# Patient Record
Sex: Female | Born: 1965
Health system: Southern US, Community
[De-identification: ages and names within clinical notes are randomized; demographics above are authoritative.]

## PROBLEM LIST (undated history)

## (undated) DIAGNOSIS — J45909 Unspecified asthma, uncomplicated: Secondary | ICD-10-CM

## (undated) DIAGNOSIS — I10 Essential (primary) hypertension: Secondary | ICD-10-CM

## (undated) DIAGNOSIS — Z5189 Encounter for other specified aftercare: Secondary | ICD-10-CM

## (undated) DIAGNOSIS — M359 Systemic involvement of connective tissue, unspecified: Secondary | ICD-10-CM

## (undated) DIAGNOSIS — E039 Hypothyroidism, unspecified: Secondary | ICD-10-CM

## (undated) DIAGNOSIS — D560 Alpha thalassemia: Secondary | ICD-10-CM

## (undated) DIAGNOSIS — T8859XA Other complications of anesthesia, initial encounter: Secondary | ICD-10-CM

## (undated) DIAGNOSIS — M329 Systemic lupus erythematosus, unspecified: Secondary | ICD-10-CM

## (undated) DIAGNOSIS — D649 Anemia, unspecified: Secondary | ICD-10-CM

## (undated) DIAGNOSIS — M1990 Unspecified osteoarthritis, unspecified site: Secondary | ICD-10-CM

## (undated) DIAGNOSIS — IMO0002 Reserved for concepts with insufficient information to code with codable children: Secondary | ICD-10-CM

## (undated) DIAGNOSIS — T4145XA Adverse effect of unspecified anesthetic, initial encounter: Secondary | ICD-10-CM

## (undated) DIAGNOSIS — R112 Nausea with vomiting, unspecified: Secondary | ICD-10-CM

## (undated) DIAGNOSIS — Z9889 Other specified postprocedural states: Secondary | ICD-10-CM

## (undated) DIAGNOSIS — IMO0001 Reserved for inherently not codable concepts without codable children: Secondary | ICD-10-CM

## (undated) HISTORY — DX: Unspecified osteoarthritis, unspecified site: M19.90

## (undated) HISTORY — DX: Encounter for other specified aftercare: Z51.89

## (undated) HISTORY — DX: Unspecified asthma, uncomplicated: J45.909

## (undated) HISTORY — DX: Alpha thalassemia: D56.0

## (undated) HISTORY — DX: Anemia, unspecified: D64.9

## (undated) HISTORY — DX: Reserved for inherently not codable concepts without codable children: IMO0001

## (undated) HISTORY — DX: Reserved for concepts with insufficient information to code with codable children: IMO0002

## (undated) HISTORY — DX: Systemic lupus erythematosus, unspecified: M32.9

---

## 2002-02-08 ENCOUNTER — Other Ambulatory Visit: Admission: RE | Admit: 2002-02-08 | Discharge: 2002-02-08 | Payer: Self-pay | Admitting: Obstetrics and Gynecology

## 2003-10-15 ENCOUNTER — Other Ambulatory Visit: Admission: RE | Admit: 2003-10-15 | Discharge: 2003-10-15 | Payer: Self-pay | Admitting: Obstetrics and Gynecology

## 2003-11-07 ENCOUNTER — Emergency Department (HOSPITAL_COMMUNITY): Admission: EM | Admit: 2003-11-07 | Discharge: 2003-11-08 | Payer: Self-pay | Admitting: Emergency Medicine

## 2004-11-03 ENCOUNTER — Other Ambulatory Visit: Admission: RE | Admit: 2004-11-03 | Discharge: 2004-11-03 | Payer: Self-pay | Admitting: Obstetrics and Gynecology

## 2005-10-06 ENCOUNTER — Emergency Department (HOSPITAL_COMMUNITY): Admission: EM | Admit: 2005-10-06 | Discharge: 2005-10-06 | Payer: Self-pay | Admitting: Emergency Medicine

## 2006-02-16 ENCOUNTER — Emergency Department (HOSPITAL_COMMUNITY): Admission: EM | Admit: 2006-02-16 | Discharge: 2006-02-16 | Payer: Self-pay | Admitting: Emergency Medicine

## 2006-08-31 ENCOUNTER — Emergency Department (HOSPITAL_COMMUNITY): Admission: EM | Admit: 2006-08-31 | Discharge: 2006-08-31 | Payer: Self-pay | Admitting: Emergency Medicine

## 2006-11-03 ENCOUNTER — Ambulatory Visit: Payer: Self-pay | Admitting: Endocrinology

## 2006-11-03 LAB — CONVERTED CEMR LAB
ALT: 34 units/L (ref 0–40)
AST: 37 units/L (ref 0–37)
Albumin: 3.5 g/dL (ref 3.5–5.2)
Alkaline Phosphatase: 72 units/L (ref 39–117)
BUN: 10 mg/dL (ref 6–23)
Basophils Absolute: 0 10*3/uL (ref 0.0–0.1)
Basophils Relative: 0.2 % (ref 0.0–1.0)
Bilirubin Urine: NEGATIVE
Bilirubin, Direct: 0.1 mg/dL (ref 0.0–0.3)
CO2: 22 meq/L (ref 19–32)
Calcium: 9 mg/dL (ref 8.4–10.5)
Chloride: 112 meq/L (ref 96–112)
Cholesterol: 187 mg/dL (ref 0–200)
Creatinine, Ser: 0.6 mg/dL (ref 0.4–1.2)
Eosinophils Absolute: 0 10*3/uL (ref 0.0–0.6)
Eosinophils Relative: 0.9 % (ref 0.0–5.0)
GFR calc Af Amer: 143 mL/min
GFR calc non Af Amer: 118 mL/min
Glucose, Bld: 92 mg/dL (ref 70–99)
HCT: 28 % — ABNORMAL LOW (ref 36.0–46.0)
HDL: 31.5 mg/dL — ABNORMAL LOW (ref >39.0)
Hemoglobin, Urine: NEGATIVE
Hemoglobin: 9 g/dL — ABNORMAL LOW (ref 12.0–15.0)
Ketones, ur: NEGATIVE mg/dL
LDL Cholesterol: 126 mg/dL — ABNORMAL HIGH (ref 0–99)
Leukocytes, UA: NEGATIVE
Lymphocytes Relative: 26 % (ref 12.0–46.0)
MCHC: 32.2 g/dL (ref 30.0–36.0)
MCV: 64.4 fL — ABNORMAL LOW (ref 78.0–100.0)
Monocytes Absolute: 0.5 10*3/uL (ref 0.2–0.7)
Monocytes Relative: 11.1 % — ABNORMAL HIGH (ref 3.0–11.0)
Neutro Abs: 3.1 10*3/uL (ref 1.4–7.7)
Neutrophils Relative %: 61.8 % (ref 43.0–77.0)
Nitrite: NEGATIVE
Platelets: 281 10*3/uL (ref 150–400)
Potassium: 3.7 meq/L (ref 3.5–5.1)
RBC: 4.34 M/uL (ref 3.87–5.11)
RDW: 18.7 % — ABNORMAL HIGH (ref 11.5–14.6)
Sodium: 142 meq/L (ref 135–145)
Specific Gravity, Urine: 1.025 (ref 1.000–1.03)
TSH: 3.02 microintl units/mL (ref 0.35–5.50)
Total Bilirubin: 0.4 mg/dL (ref 0.3–1.2)
Total CHOL/HDL Ratio: 5.9
Total Protein: 8 g/dL (ref 6.0–8.3)
Triglycerides: 149 mg/dL (ref 0–149)
Urine Glucose: NEGATIVE mg/dL
Urobilinogen, UA: 0.2 (ref 0.0–1.0)
VLDL: 30 mg/dL (ref 0–40)
WBC: 4.8 10*3/uL (ref 4.5–10.5)
pH: 6.5 (ref 5.0–8.0)

## 2006-11-07 ENCOUNTER — Ambulatory Visit: Payer: Self-pay | Admitting: Endocrinology

## 2006-11-10 ENCOUNTER — Ambulatory Visit: Payer: Self-pay | Admitting: Endocrinology

## 2006-11-10 LAB — CONVERTED CEMR LAB
Basophils Relative: 0.4 % (ref 0.0–1.0)
Eosinophils Relative: 1.5 % (ref 0.0–5.0)
Lymphocytes Relative: 33.4 % (ref 12.0–46.0)
Monocytes Absolute: 0.6 10*3/uL (ref 0.2–0.7)
Monocytes Relative: 14.2 % — ABNORMAL HIGH (ref 3.0–11.0)
Neutro Abs: 1.9 10*3/uL (ref 1.4–7.7)
Neutrophils Relative %: 50.5 % (ref 43.0–77.0)
RDW: 18.5 % — ABNORMAL HIGH (ref 11.5–14.6)
WBC: 3.9 10*3/uL — ABNORMAL LOW (ref 4.5–10.5)

## 2006-11-27 ENCOUNTER — Ambulatory Visit: Payer: Self-pay | Admitting: Endocrinology

## 2006-11-27 LAB — CONVERTED CEMR LAB
Hemoglobin: 6.6 g/dL — CL (ref 12.0–15.0)
Iron: 43 ug/dL (ref 42–145)
Lymphocytes Relative: 28.3 % (ref 12.0–46.0)
MCHC: 32 g/dL (ref 30.0–36.0)
MCV: 64.1 fL — ABNORMAL LOW (ref 78.0–100.0)
Monocytes Absolute: 0.5 10*3/uL (ref 0.2–0.7)
Monocytes Relative: 13.2 % — ABNORMAL HIGH (ref 3.0–11.0)
Neutro Abs: 2 10*3/uL (ref 1.4–7.7)
Platelets: 199 10*3/uL (ref 150–400)
Prolactin: 72.3 ng/mL
RBC: 3.21 M/uL — ABNORMAL LOW (ref 3.87–5.11)
Transferrin: 234.1 mg/dL (ref >=212.0)
WBC: 3.5 10*3/uL — ABNORMAL LOW (ref 4.5–10.5)

## 2007-01-17 ENCOUNTER — Emergency Department (HOSPITAL_COMMUNITY): Admission: EM | Admit: 2007-01-17 | Discharge: 2007-01-17 | Payer: Self-pay | Admitting: Emergency Medicine

## 2007-01-21 ENCOUNTER — Emergency Department (HOSPITAL_COMMUNITY): Admission: EM | Admit: 2007-01-21 | Discharge: 2007-01-21 | Payer: Self-pay | Admitting: *Deleted

## 2007-01-23 ENCOUNTER — Ambulatory Visit: Payer: Self-pay | Admitting: Endocrinology

## 2007-01-31 ENCOUNTER — Ambulatory Visit: Payer: Self-pay | Admitting: Internal Medicine

## 2007-03-22 ENCOUNTER — Ambulatory Visit: Payer: Self-pay | Admitting: Internal Medicine

## 2007-05-03 ENCOUNTER — Encounter: Payer: Self-pay | Admitting: Endocrinology

## 2007-05-03 DIAGNOSIS — E039 Hypothyroidism, unspecified: Secondary | ICD-10-CM | POA: Insufficient documentation

## 2007-05-03 DIAGNOSIS — D509 Iron deficiency anemia, unspecified: Secondary | ICD-10-CM | POA: Insufficient documentation

## 2007-05-07 ENCOUNTER — Ambulatory Visit: Payer: Self-pay | Admitting: Internal Medicine

## 2007-05-07 ENCOUNTER — Ambulatory Visit: Payer: Self-pay | Admitting: Oncology

## 2007-05-07 ENCOUNTER — Inpatient Hospital Stay (HOSPITAL_COMMUNITY): Admission: EM | Admit: 2007-05-07 | Discharge: 2007-05-12 | Payer: Self-pay | Admitting: *Deleted

## 2007-05-07 ENCOUNTER — Encounter (INDEPENDENT_AMBULATORY_CARE_PROVIDER_SITE_OTHER): Payer: Self-pay | Admitting: *Deleted

## 2007-05-07 ENCOUNTER — Ambulatory Visit: Payer: Self-pay | Admitting: Cardiology

## 2007-05-08 ENCOUNTER — Ambulatory Visit: Payer: Self-pay | Admitting: Internal Medicine

## 2007-05-11 ENCOUNTER — Ambulatory Visit: Payer: Self-pay | Admitting: Oncology

## 2007-05-21 ENCOUNTER — Ambulatory Visit: Payer: Self-pay | Admitting: Endocrinology

## 2007-05-22 LAB — CBC WITH DIFFERENTIAL/PLATELET
BASO%: 0.1 % (ref 0.0–2.0)
EOS%: 0.3 % (ref 0.0–7.0)
HCT: 27.4 % — ABNORMAL LOW (ref 34.8–46.6)
MCH: 21.5 pg — ABNORMAL LOW (ref 26.0–34.0)
MCHC: 32.4 g/dL (ref 32.0–36.0)
MCV: 66.3 fL — ABNORMAL LOW (ref 81.0–101.0)
NEUT#: 1.8 10*3/uL (ref 1.5–6.5)
Platelets: 264 10*3/uL (ref 145–400)
RBC: 4.14 10*6/uL (ref 3.70–5.32)
RDW: 22.5 % — ABNORMAL HIGH (ref 11.3–14.5)
lymph#: 1 10*3/uL (ref 0.9–3.3)

## 2007-05-22 LAB — MORPHOLOGY

## 2007-06-13 LAB — CBC WITH DIFFERENTIAL/PLATELET
BASO%: 0 % (ref 0.0–2.0)
EOS%: 3.3 % (ref 0.0–7.0)
LYMPH%: 21.8 % (ref 14.0–48.0)
MCH: 22.5 pg — ABNORMAL LOW (ref 26.0–34.0)
MCV: 67.1 fL — ABNORMAL LOW (ref 81.0–101.0)
Platelets: 209 10*3/uL (ref 145–400)
RDW: 22.7 % — ABNORMAL HIGH (ref 11.3–14.5)
lymph#: 0.8 10*3/uL — ABNORMAL LOW (ref 0.9–3.3)

## 2007-07-11 ENCOUNTER — Ambulatory Visit: Payer: Self-pay | Admitting: Oncology

## 2007-07-18 ENCOUNTER — Emergency Department (HOSPITAL_COMMUNITY): Admission: EM | Admit: 2007-07-18 | Discharge: 2007-07-18 | Payer: Self-pay | Admitting: Emergency Medicine

## 2007-07-25 ENCOUNTER — Encounter: Payer: Self-pay | Admitting: Endocrinology

## 2007-07-25 ENCOUNTER — Ambulatory Visit: Payer: Self-pay | Admitting: Endocrinology

## 2007-07-25 DIAGNOSIS — D353 Benign neoplasm of craniopharyngeal duct: Secondary | ICD-10-CM

## 2007-07-25 DIAGNOSIS — D352 Benign neoplasm of pituitary gland: Secondary | ICD-10-CM | POA: Insufficient documentation

## 2007-07-25 LAB — CBC WITH DIFFERENTIAL/PLATELET
Basophils Absolute: 0 10*3/uL (ref 0.0–0.1)
EOS%: 1.4 % (ref 0.0–7.0)
Eosinophils Absolute: 0.1 10*3/uL (ref 0.0–0.5)
HCT: 26.1 % — ABNORMAL LOW (ref 34.8–46.6)
HGB: 8.6 g/dL — ABNORMAL LOW (ref 11.6–15.9)
MCHC: 32.9 g/dL (ref 32.0–36.0)
MONO#: 0.5 10*3/uL (ref 0.1–0.9)
NEUT%: 66.3 % (ref 39.6–76.8)
Platelets: 230 10*3/uL (ref 145–400)
RBC: 3.82 10*6/uL (ref 3.70–5.32)
WBC: 4.8 10*3/uL (ref 3.9–10.0)
lymph#: 1 10*3/uL (ref 0.9–3.3)

## 2007-07-25 LAB — IRON AND TIBC
%SAT: 26 % (ref 20–55)
Iron: 54 ug/dL (ref 42–145)

## 2007-07-25 LAB — CONVERTED CEMR LAB: TSH: 1.69 microintl units/mL (ref 0.35–5.50)

## 2007-07-25 LAB — FERRITIN: Ferritin: 1107 ng/mL — ABNORMAL HIGH (ref 10–291)

## 2007-07-30 ENCOUNTER — Encounter: Admission: RE | Admit: 2007-07-30 | Discharge: 2007-07-30 | Payer: Self-pay | Admitting: Endocrinology

## 2007-08-26 ENCOUNTER — Emergency Department (HOSPITAL_COMMUNITY): Admission: EM | Admit: 2007-08-26 | Discharge: 2007-08-26 | Payer: Self-pay | Admitting: Emergency Medicine

## 2007-09-02 ENCOUNTER — Emergency Department (HOSPITAL_COMMUNITY): Admission: EM | Admit: 2007-09-02 | Discharge: 2007-09-02 | Payer: Self-pay | Admitting: Emergency Medicine

## 2007-09-18 ENCOUNTER — Telehealth (INDEPENDENT_AMBULATORY_CARE_PROVIDER_SITE_OTHER): Payer: Self-pay | Admitting: *Deleted

## 2007-09-21 ENCOUNTER — Emergency Department (HOSPITAL_COMMUNITY): Admission: EM | Admit: 2007-09-21 | Discharge: 2007-09-21 | Payer: Self-pay | Admitting: Family Medicine

## 2007-10-04 HISTORY — PX: ABDOMINAL HYSTERECTOMY: SHX81

## 2007-10-09 ENCOUNTER — Telehealth (INDEPENDENT_AMBULATORY_CARE_PROVIDER_SITE_OTHER): Payer: Self-pay | Admitting: *Deleted

## 2007-10-11 ENCOUNTER — Ambulatory Visit: Payer: Self-pay | Admitting: Endocrinology

## 2007-10-11 DIAGNOSIS — K1120 Sialoadenitis, unspecified: Secondary | ICD-10-CM | POA: Insufficient documentation

## 2007-10-24 ENCOUNTER — Telehealth (INDEPENDENT_AMBULATORY_CARE_PROVIDER_SITE_OTHER): Payer: Self-pay | Admitting: *Deleted

## 2007-10-24 ENCOUNTER — Emergency Department (HOSPITAL_COMMUNITY): Admission: EM | Admit: 2007-10-24 | Discharge: 2007-10-24 | Payer: Self-pay | Admitting: Emergency Medicine

## 2007-11-06 ENCOUNTER — Ambulatory Visit: Payer: Self-pay | Admitting: Oncology

## 2007-11-08 ENCOUNTER — Encounter: Payer: Self-pay | Admitting: Endocrinology

## 2007-11-08 LAB — CBC WITH DIFFERENTIAL/PLATELET
Basophils Absolute: 0 10*3/uL (ref 0.0–0.1)
Eosinophils Absolute: 0.2 10*3/uL (ref 0.0–0.5)
HCT: 28.8 % — ABNORMAL LOW (ref 34.8–46.6)
HGB: 8.8 g/dL — ABNORMAL LOW (ref 11.6–15.9)
MCH: 20.7 pg — ABNORMAL LOW (ref 26.0–34.0)
MONO#: 0.4 10*3/uL (ref 0.1–0.9)
NEUT#: 2.9 10*3/uL (ref 1.5–6.5)
NEUT%: 64.2 % (ref 39.6–76.8)
WBC: 4.5 10*3/uL (ref 3.9–10.0)
lymph#: 1 10*3/uL (ref 0.9–3.3)

## 2008-02-05 ENCOUNTER — Ambulatory Visit: Payer: Self-pay | Admitting: Oncology

## 2008-03-01 ENCOUNTER — Emergency Department (HOSPITAL_COMMUNITY): Admission: EM | Admit: 2008-03-01 | Discharge: 2008-03-01 | Payer: Self-pay | Admitting: Emergency Medicine

## 2008-04-23 ENCOUNTER — Ambulatory Visit: Payer: Self-pay | Admitting: Obstetrics and Gynecology

## 2008-04-23 ENCOUNTER — Other Ambulatory Visit: Admission: RE | Admit: 2008-04-23 | Discharge: 2008-04-23 | Payer: Self-pay | Admitting: Obstetrics and Gynecology

## 2008-05-21 ENCOUNTER — Encounter: Payer: Self-pay | Admitting: Obstetrics and Gynecology

## 2008-05-21 ENCOUNTER — Ambulatory Visit: Payer: Self-pay | Admitting: Obstetrics & Gynecology

## 2008-06-03 ENCOUNTER — Inpatient Hospital Stay (HOSPITAL_COMMUNITY): Admission: AD | Admit: 2008-06-03 | Discharge: 2008-06-03 | Payer: Self-pay | Admitting: Obstetrics & Gynecology

## 2008-06-04 ENCOUNTER — Ambulatory Visit (HOSPITAL_COMMUNITY): Admission: RE | Admit: 2008-06-04 | Discharge: 2008-06-04 | Payer: Self-pay | Admitting: Obstetrics and Gynecology

## 2008-07-15 ENCOUNTER — Ambulatory Visit: Payer: Self-pay | Admitting: Obstetrics & Gynecology

## 2008-07-15 ENCOUNTER — Ambulatory Visit (HOSPITAL_COMMUNITY): Admission: RE | Admit: 2008-07-15 | Discharge: 2008-07-15 | Payer: Self-pay | Admitting: Obstetrics & Gynecology

## 2008-07-31 ENCOUNTER — Ambulatory Visit: Payer: Self-pay | Admitting: Obstetrics and Gynecology

## 2008-08-11 ENCOUNTER — Inpatient Hospital Stay (HOSPITAL_COMMUNITY): Admission: AD | Admit: 2008-08-11 | Discharge: 2008-08-11 | Payer: Self-pay | Admitting: Obstetrics & Gynecology

## 2008-08-13 ENCOUNTER — Ambulatory Visit: Payer: Self-pay | Admitting: Obstetrics & Gynecology

## 2008-09-02 ENCOUNTER — Encounter: Payer: Self-pay | Admitting: Obstetrics & Gynecology

## 2008-09-02 ENCOUNTER — Ambulatory Visit: Payer: Self-pay | Admitting: Obstetrics & Gynecology

## 2008-09-02 ENCOUNTER — Inpatient Hospital Stay (HOSPITAL_COMMUNITY): Admission: RE | Admit: 2008-09-02 | Discharge: 2008-09-04 | Payer: Self-pay | Admitting: Obstetrics & Gynecology

## 2008-09-02 HISTORY — PX: HYSTERECTOMY ABDOMINAL WITH SALPINGO-OOPHORECTOMY: SHX6792

## 2008-09-18 ENCOUNTER — Ambulatory Visit: Payer: Self-pay | Admitting: Obstetrics & Gynecology

## 2008-10-01 ENCOUNTER — Ambulatory Visit: Payer: Self-pay | Admitting: Obstetrics and Gynecology

## 2008-10-03 HISTORY — PX: KNEE SURGERY: SHX244

## 2008-10-15 ENCOUNTER — Ambulatory Visit: Payer: Self-pay | Admitting: Obstetrics & Gynecology

## 2008-10-29 ENCOUNTER — Ambulatory Visit: Payer: Self-pay | Admitting: Obstetrics & Gynecology

## 2009-12-02 ENCOUNTER — Telehealth (INDEPENDENT_AMBULATORY_CARE_PROVIDER_SITE_OTHER): Payer: Self-pay | Admitting: *Deleted

## 2010-10-24 ENCOUNTER — Encounter: Payer: Self-pay | Admitting: *Deleted

## 2010-11-02 NOTE — Progress Notes (Signed)
Summary: Records request from [facility]  Request for records received from [facility]. Request forwarded to [facility]. Dena Chavis  December 02, 2009 11:49 AM

## 2011-02-15 NOTE — Group Therapy Note (Signed)
NAMECLYTEE, HEINRICH NO.:  [id number]   MEDICAL RECORD NO.:  [id number]          PATIENT TYPE:  WOC   LOCATION:  [facility]                   FACILITY:  [facility]   PHYSICIAN:  Elsie Lincoln, MD      DATE OF BIRTH:  [birth year]   DATE OF SERVICE:  08/13/2008                                  CLINIC NOTE   The patient is a 45 year old female who had endometrial ablation a few  weeks ago.  It has failed.  She was seen in the MAU yesterday bleeding  heavily.  Hemoglobin is still 7.5 despite being on iron twice a day.  At  this point she needs a hysterectomy simply because the fibroid is so  large it did encompass her entire cavity.  She was consented for this  procedure.  Given that it is about 12-14 weeks' size on bimanual, she  was consented for a total abdominal hysterectomy.  She will retain her  ovaries.  The risks of this procedure are bleeding, infection, damage to  the intra-abdominal organs.  So we will try to get her in in the next  few weeks for the hysterectomy.  Patient is to continue the iron.           ______________________________  Elsie Lincoln, MD     KL/MEDQ  D:  08/13/2008  T:  08/14/2008  Job:  [id number]

## 2011-02-15 NOTE — Group Therapy Note (Signed)
Michelle Huffman, Michelle Huffman             ACCOUNT NO.:  [id number]   MEDICAL RECORD NO.:  [id number]          PATIENT TYPE:  WOC   LOCATION:  [facility]                   FACILITY:  [facility]   PHYSICIAN:  Allie Bossier, MD        DATE OF BIRTH:  [birth year]   DATE OF SERVICE:                                  CLINIC NOTE   HISTORY OF PRESENT ILLNESS:  Michelle Huffman is now 6 weeks postop status post  TAH done by Dr. Silas Flood.  Pathology has come back with fibroids.  Her right  ovary and tube were also removed and these were unremarkable.  There was  no evidence of endometriosis or malignancy.  She was seen here 2 weeks  ago by Dr. Okey Dupre, at that time for wound check and there was small area  (1 cm) in the middle that he described as healing.  Her complaint today  is especially bad back pain.  She says that she is supposed to go back  to work tomorrow but does not feel that her pain is controlled enough  for her to be able to go to work.  She says the Percocet don't work  and she says she needs to get a refill on the ibuprofen.  On her  abdominal exam her incision is completely healed and looks beautiful.  There is no specific point tenderness.  However, she walks as if she is  in pain.  I have written her out of work for another 2 weeks and  recommended that she make another follow-up visit here in 1 to 2 weeks.  I have also recommended ibuprofen 800 p.o. q.8 h with food.      Allie Bossier, MD     MCD/MEDQ  D:  10/15/2008  T:  10/16/2008  Job:  [id number]

## 2011-02-15 NOTE — Op Note (Signed)
Michelle Huffman, Michelle Huffman             ACCOUNT NO.:  [id number]   MEDICAL RECORD NO.:  [id number]          PATIENT TYPE:  INP   LOCATION:  9316                          [facility]   PHYSICIAN:  Norton Blizzard, MD    DATE OF BIRTH:  1966-08-08   DATE OF PROCEDURE:  09/02/2008  DATE OF DISCHARGE:                               OPERATIVE REPORT   PREOPERATIVE DIAGNOSES:  Menorrhagia, uterine fibroids, failed  endometrial ablation.   POSTOPERATIVE DIAGNOSES:  Menorrhagia, uterine fibroids, failed  endometrial ablation.   PROCEDURE:  Total abdominal hysterectomy, right salpingo-oophorectomy.   SURGEON:  Norton Blizzard, MD   ASSISTANT:  Lesly Dukes, MD   ANESTHESIA:  General.   IV FLUIDS:  1700 mL of lactated Ringer's.   URINE OUTPUT:  300 mL.   ESTIMATED BLOOD LOSS:  250 mL.   INDICATIONS:  The patient is a 45 year old gravida 3, para 1-0-2-1 with  a long history of menorrhagia and uterine fibroids.  The patient  underwent an endometrial ablation in October using hydrothermal  ablation, but continued to have heavy bleeding and bled down to a  hemoglobin of 7.5.  The patient was counseled regarding the need for a  total abdominal hysterectomy.  Prior to surgery, the risks of surgery  including bleeding, infection, injury to surrounding organs including  bowel, bladder, ureters, and need for additional procedures were  explained to the patient, and written informed consent was obtained.   FINDINGS:  10 weeks' size fibroid uterus, adherent right adnexae, normal  left fallopian tube and ovary.   SPECIMENS:  Uterus plus cervix plus right fallopian tube and ovary.   DISPOSITION:  Specimens to Pathology.   COMPLICATIONS:  None.   PROCEDURE DETAILS:  The patient received IV preoperative antibiotics  prior to entry into the operating room.  Compression boots were also  applied to the lower extremities in the preoperative area.  The patient  was then taken to the operating  room and given general anesthesia, which  was found to be adequate.  The abdomen and perineum were prepped and  draped in the usual manner and a Foley catheter was inserted into the  bladder and attached to constant drainage.  A Pfannenstiel transverse  incision was made 2 fingerbreadths above the pubic symphysis.  This  incision was taken down the fascia using electrocautery.  The incision  was extended bilaterally using electrocautery without difficulty.  The  fascia was then dissected off the underlying rectus muscles using blunt  dissection.  The rectus muscles were split bluntly in the midline and  the peritoneum was entered bluntly without complication.  This incision  was extended superiorly and inferiorly with care given to prevent bowel  or bladder injury.  Attention was then turned to the pelvis where the  round ligaments on each side were clamped, transected with  electrocautery, and suture ligated allowing for entry into the broad  ligament.  The anterior and posterior leaves of the broad ligament were  separated and the ureters were palpated bilaterally on the medial leaf  of the posterior broad ligament confirming that they  were safely away  from the area of dissection.  A hole was created in the clear portion of  the posterior broad ligament and the adnexae were clamped on the  patient's right side.  Unfortunately, the right adnexa was noted to be  tightly adherent to the uterus on the right side and part of the  infundibulopelvic ligament was also clamped during this point.  The  decision was made to proceed with right salpingo-oophorectomy.  The  infundibulopelvic ligament was then clamped, cut, and doubly suture  ligated with good hemostasis.  Of note, all the suture used in this  surgery were 0-Vicryl unless otherwise noted.  The left adnexa was  clamped, cut, and doubly suture ligated and allowing for the left adnexa  to remain in place.  The broad ligament was then  serially clamped, cut,  and ligated bilaterally.  A bladder flap was then created across the  anterior leaf of the broad ligament and the bladder was bluntly  dissected off the lower uterine segment and cervix with good hemostasis.  The uterine arteries were then skeletonized bilaterally and clamped,  cut, and ligated with care given to prevent ureteral injury.  The  uterosacral ligaments were then clamped, cut, and ligated bilaterally.  Finally, the cardinal ligament were clamped, cut, and ligated  bilaterally.  Acutely curved clamps were placed across the vagina just  under the cervix and the specimen was amputated and sent to Pathology.  The vaginal cuff was closed with a running stitch with care given to  incorporate the anterior pubocervical fascia and the posterior  rectovaginal fascia.  The vaginal cuff angles were noted to have good  hemostasis.  The pelvis was irrigated and hemostasis was confirmed on  all pedicles, vaginal cuff, and along pelvic sidewall.  The peritoneum  was then reapproximated using 0-Vicryl in a running stitch.  The fascia  was then reapproximated using 0-Vicryl continuous running stitch.  The  subcutaneous tissues were irrigated and reapproximated with 3  interrupted stitches of 2-0 plain gut, and the skin was closed with 4-0  subcuticular stitch.  The patient tolerated the procedure well.  Sponge,  needle, and instrument counts were correct x2.  She was taken to  recovery room in extubated, awake and in stable condition.      Norton Blizzard, MD  Electronically Signed     UAD/MEDQ  D:  09/02/2008  T:  09/03/2008  Job:  [phone] [phone]

## 2011-02-15 NOTE — Group Therapy Note (Signed)
Michelle Huffman, Michelle Huffman             ACCOUNT NO.:  [id number]   MEDICAL RECORD NO.:  [id number]          PATIENT TYPE:  WOC   LOCATION:  [facility]                   FACILITY:  [facility]   PHYSICIAN:  Johnella Moloney, MD        DATE OF BIRTH:  [birth year]   DATE OF SERVICE:  10/29/2008                                  CLINIC NOTE   The patient is a 45 year old gravida 3, para 1-0-2-1 who underwent a  total abdominal hysterectomy and right salpingo-oophorectomy on September 02, 2008.  The patient is here today for her postsurgical followup.  She  reports having right lower quadrant pain that started after she  sustained a fall on January 22.  The patient has reported more pain on  the right side.  She does not report feeling any masses, having any  drainage from her incision or any other concerning symptoms.  The  patient is due to start work tomorrow.  She is currently not sexually  active and does not have any other concerns.   PHYSICAL EXAMINATION:  Temperature 98.3, pulse 70, blood pressure  137/91, weight 216.5 pounds, height 5 feet 4 inches, respirations 20.  IN GENERAL:  No apparent distress.  ABDOMEN:  Soft, obese.  Right lower quadrant tenderness without rebound  or guarding.  No abnormal masses palpated.  Incision is clean, dry and  intact and is well-healed.  No erythema and no drainage noted.  PELVIC EXAM:  Normal external female genitalia.  Pink well rugated  vagina.  Well-healed vaginal cuff.  No abnormal lesions or drainage  noted.  Bimanual exam:  No adnexal tenderness or any other tenderness  and no abnormal masses.   ASSESSMENT/PLAN:  The patient is a 45 year old here for a post surgical  followup after a total abdominal hysterectomy.  Of note,  abdominal  hysterectomy pathology analysis was just remarkable for leiomyomata.  This patient is having a lot of pain especially on her right side after  her fall.  Pain seems to be mostly musculoskeletal in origin.  Her  incision  palpated fine with no abnormalities that are concerning.  The  patient is currently taking ibuprofen p.r.n. as needed for pain, but  said that this does not work.  She does not want to take Percocet or any  other narcotic because it makes her feel sick and does not help her  pain.  The patient was given a prescription for Flexeril which could  help alleviate some of the muscle tension and spasm, especially since  all of this started after a fall and could just have an element of spasm  or tension.  She was given a prescription for Flexeril 10 mg p.o. daily,  30 tablets with two refills.  She was told to come back for further  evaluation if her pain worsens or does not get improved.  She will come  back for her annual exam in one year or earlier as needed for any  gynecologic problems.           ______________________________  Johnella Moloney, MD     UD/MEDQ  D:  10/29/2008  T:  10/29/2008  Job:  [id number]

## 2011-02-15 NOTE — H&P (Signed)
NAMETAMBERLY, POMPLUN             ACCOUNT NO.:  [id number]   MEDICAL RECORD NO.:  [id number]          PATIENT TYPE:  INP   LOCATION:  0102                         [facility]ACILITY:  [facility]   PHYSICIAN:  Hollice Espy, M.D.DATE OF BIRTH:  Mar 07, 1966   DATE OF ADMISSION:  05/06/2007  DATE OF DISCHARGE:                              HISTORY & PHYSICAL   Mrs. Bonilla is a 46 year old female who is coming in tonight with  complaints of fever for one day.  She was feeling fine when she woke up  one day ago and then yesterday afternoon, she started to  have some mild  abdominal pain, nonradiating, crampy in the lower part of the abdomen,  associated with a fever of 102.  She had began taking Motrin with some  relief of the fever.  However, she felt that her abdominal pain had  worsened from it.  She stopped taking the Motrin and her abdominal pain  actually went away.  She currently has no abdominal pain.  However, she  comes in tonight with a fever of 103 to the emergency room and a mild  headache as well.   REVIEW OF SYSTEMS:  She denies any sore throat, sick contacts, no cough,  no chest pain, no shortness of breath.  No neck stiffness, no blurred  vision, no photophobia, no painful urination, no frequency of urination,  no diarrhea, no nausea, no vomiting.  She does have a decreased  appetite.  However, she is currently feeling somewhat hungry.  She  denies any drug use at that time.  All other review of systems are  negative.   PAST MEDICAL HISTORY:  1. Hypothyroidism.  2. Iron-deficiency anemia.  3. Pituitary tumor diagnosed approximately 20 years ago.   FAMILY HISTORY:  Father has hypertension.   SOCIAL HISTORY:  She is a former smoker, occasional alcohol user.  Never  used IV drugs.  No transfusions.   DRUG ALLERGIES:  No known drug allergies.   MEDICATIONS:  1. Synthroid unknown dose.  2. Parlodel 2.5 mg p.o. at bedtime.  3. Ferrous sulfate 335 mg p.o. t.i.d.   PHYSICAL  EXAMINATION:  VITAL SIGNS:  Blood pressure 111/71, pulse  initially 135, temperature 103, respirations 18, oxygen saturation 100%  on room air.  GENERAL:  She is obese.  She is sitting up.  She is pleasant.  She is  talking in full sentences.  She did not look distressed at all.  HEENT:  Benign.  NECK:  No nuchal rigidity.  Soft.  No lymphadenopathy.  CHEST:  Clear to auscultation bilaterally.  No wheezes or rhonchi.  CARDIAC:  Tachycardic.  However, it is regular.  No rubs, no gallops.  ABDOMEN:  Soft, nontender, nondistended.  Positive bowel sounds.  No  hepatosplenomegaly.  EXTREMITIES:  No clubbing, cyanosis or edema.  SKIN:  No rashes.  No lesions noted.  NEUROLOGIC:  She has no focal deficits.   LABORATORY DATA:  WBC 1.6, 64% neutrophils with an absolute neutrophil  count of 1000.  Hemoglobin 8.1, platelets 149, with atypical lymphocytes  seen.  Urinalysis was positive for protein.  Her  pregnancy test was  negative.  AST slightly elevated at 79.   IMPRESSION:  1. Fever.  2. Pancytopenia.  3. Abdominal pain.  4. Hypothyroidism.  5. Pituitary tumor.   PLAN:  1. My differential diagnosis for this fever is mononucleosis versus      endocarditis versus HIV versus other.  She has no clear source of      infection.  No obvious bacterial source anyway.  I will check an      HIV test.  I will check a mono spot or heterophile antibody test.      I want to check an echocardiogram as well.  Blood cultures and      urine cultures were already drawn and sent in the emergency room.      I will not give any antibiotic at this time.  I will treat her      fever unless there is a more obvious source.  2. Pancytopenia.  Certainly could be due to HIV or mono.  It could      also be due to an overwhelming infection or certainly a leukemia is      on the list of possibilities.  Should she not recover with some      therapy, she may wind up needing a bone marrow biopsy.  3. Abdominal pain.  At  this time it is resolved.  I do not think      further work-up is needed at this time.  Should she worsen, a CT      scan would probably be warranted looking for some sort of walled      off infection.  4. Hypothyroidism.  Continue her Synthroid.  I will start her at 100      mcg per day until she brings her medication from home.  5. Pituitary tumor.  Parlodel does not cause bone marrow suppression      and I will continue it at this time at her current dose.  6. I will place her up on neutropenic precautions on this floor and I      will treat her conservatively at this time.  7. Deep venous thrombosis prophylaxis to be done with Lovenox.      Hollice Espy, M.D.  Electronically Signed     Hollice Espy, M.D.  Electronically Signed    SKK/MEDQ  D:  05/07/2007  T:  05/07/2007  Job:  [id number]

## 2011-02-15 NOTE — Discharge Summary (Signed)
Michelle Huffman, Michelle Huffman             ACCOUNT NO.:  [id number]   MEDICAL RECORD NO.:  [id number]           PATIENT TYPE:   LOCATION:                                 FACILITY:   PHYSICIAN:  Norton Blizzard, MD    DATE OF BIRTH:  1965-11-20   DATE OF ADMISSION:  09/02/2008  DATE OF DISCHARGE:  09/04/2008                               DISCHARGE SUMMARY   ADMISSION DIAGNOSES:  Menorrhagia, uterine fibroids, failed ablation,  and anemia.   DISCHARGE DIAGNOSES:  Status post total abdominal hysterectomy and right  salpingo-oophorectomy.   OTHER PROCEDURES:  Transfusion of 2 units of packed red blood cells for  preoperative hemoglobin of 8.1.  A post transfusion hemoglobin was 9.7.  The patient remained asymptomatic throughout her postoperative course.   BRIEF HOSPITAL COURSE:  The patient is a 45 year old gravida 3, para 1-0-  1-2-1 who has a long history of menorrhagia, uterine fibroids, and  anemia.  The patient had bled down to a hemoglobin of 7.5 in the past.  She underwent an endometrial ablation in October which did not help with  her menorrhagia.  The patient was then scheduled for a hysterectomy.  She underwent an uncomplicated TAH/RSO on September 02, 2008.  For further  details of this operation, please refer to separate dictated operative  report.  The patient had preoperative hemoglobin of 8.1 and 2 units of  red blood cells were transfused during the case for a postoperative  hemoglobin of 9.7.  The patient had no signs or symptoms of anemia.  She  had an uncomplicated postoperative course.  By postoperative day #2, she  was tolerating regular diet, ambulating, voiding without difficulty.  Her pain was controlled on oral pain medications and she was passing  flatus.  She was deemed stable for discharge to home.   DISCHARGE MEDICATIONS:  1. Percocet 5/325 one-two tablets p.o. q.6 h. p.r.n. pain.  2. Ibuprofen 600 mg p.o. q.6 h. p.r.n. pain.  3. Colace 100 mg p.o. b.i.d. p.r.n.  constipation.   DISCHARGE INSTRUCTIONS AND FOLLOWUP:  The patient was told to walk, to  continue to ambulate,and to increase activity slowly.  She was told to  take showers but avoid bathing or sitting in pools of water.  No driving  while on Percocet and no lifting anything greater than 15 pounds for the  next 6 weeks.  She was also told to avoid any sexual activity for the  next 6 weeks and to keep her incision clean and dry.  The patient was  told to call the clinic or come to the emergency room for any pain that  is not controlled on oral pain medications, nausea and vomiting, or any  other symptoms.  The patient does have follow up appointments in the GYN  clinic on September 18, 2008, at 12:45 p.m. for her 2-week check, and on  October 23, 2008, at 1 p.m. for her 6-week postoperative examination and  evaluation.  These appointments were communicated to the patient.      Norton Blizzard, MD  Electronically Signed     UAD/MEDQ  D:  09/04/2008  T:  09/04/2008  Job:  [id number]

## 2011-02-15 NOTE — Group Therapy Note (Signed)
Michelle Huffman, Michelle Huffman NO.:  [id number]   MEDICAL RECORD NO.:  [id number]          PATIENT TYPE:  WOC   LOCATION:  [facility]                   FACILITY:  [facility]   PHYSICIAN:  Elsie Lincoln, MD      DATE OF BIRTH:  [birth year]   DATE OF SERVICE:                                  CLINIC NOTE   CHIEF COMPLAINT:  Menorrhagia.   HISTORY OF PRESENT ILLNESS:  This is a 45 year old female with a  longstanding history of menorrhagia who was seen in our clinic on April 23, 2008 for evaluation of this.  At that time her Emelda Brothers was removed,  however, she had another period that began on May 01, 2008 and lasted  for 10 days and was heavy with cramping and clots.  This is consistent  with her past periods.  On April 23, 2008 an endometrial biopsy was also  obtained.  The results were discussed with the patient.  She was told  that she has a benign endometrial polyp, fragments of which were found  on the endometrial biopsy.  She has longstanding anemia associated with  her menorrhagia for which she takes iron supplementation.  She is  followed at the [facility] for this anemia and is followed  formally by Dr. Marcelle Overlie at [facility] for this menorrhagia.  He had, in the past, recommended an endometrial ablation versus  hysterectomy, however, she transferred care over here and was lost  briefly to followup secondary to patient failure to make followup and  she is seen here since she lost her private insurance and only has  Medicaid.  Her last hemoglobin was on March 10, 2008 and showed a  hemoglobin of 8.8 with an MCV of 67.  She was also in clinic today for  her annual Pap smear.  Her last Pap smear was in March of 2008 and was  normal.   PAST MEDICAL HISTORY:  1. Anemia secondary to menorrhagia.  2. Menorrhagia.  3. Prolactinoma for which she is followed at [facility] and      for which she takes Parlodel.  4. Hypothyroidism.   OBSTETRICAL  HISTORY:  She is a G3, P1-0-2-1.   GYN HISTORY:  Last Pap smear in 2008, normal.  Mammogram 2008 which she  believes was normal.  No history of blood transfusions or GYN  operations.   FAMILY HISTORY:  Is positive for diabetes in grandmother and high blood  pressure in her father.   SOCIAL HISTORY:  She lives with her daughter who his 89 years old and  works outside of the home. She does not smoke but drinks alcohol only  socially.   PHYSICAL EXAMINATION:  GENERAL:  She is alert, oriented and in no acute  distress.  She is pleasant and cooperative.  VITAL SIGNS:  Pulse 87, blood pressure 125/84, weight 221 pounds.  CARDIAC:  Regular rate and rhythm with no murmurs, rubs or gallops.  PULMONARY:  Clear to auscultation bilaterally with normal work of  breathing.  ABDOMEN:  Obese, soft, nontender, no guarding or rebound.  No masses  palpable.  EXTREMITIES:  Nontender without edema.  HEENT:  Mildly enlarged thyroid which is mobile and nontender and  without nodules.  PELVIC:  External genitalia are normal without lesions.  Mucous  membranes are pink.  There is no vaginal bleeding.   ASSESSMENT/PLAN:  This is a 45 year old female with longstanding  menorrhagia found on endometrial biopsy to have a benign endometrial  polyp.  1. Menorrhagia.  Discussed D and C with removal of the polyp as well      as endometrial ablation with the patient.  She is agreeable to this      procedure and would like to proceed.  2. Anemia.  Will recheck her CBC today.  She is to continue her iron      supplementation daily.   FOLLOWUP:  She is to be posted for D and C and endometrial ablation by  Dr. Penne Lash who discussed with the patient today.     ______________________________  Romero Belling    ______________________________  Elsie Lincoln, MD    MO/MEDQ  D:  05/21/2008  T:  05/21/2008  Job:  [id number]

## 2011-02-15 NOTE — Discharge Summary (Signed)
NAMEHETTIE, ROSELLI             ACCOUNT NO.:  [id number]   MEDICAL RECORD NO.:  [id number]          PATIENT TYPE:  INP   LOCATION:  1335                         FACILITY:  [facility]   PHYSICIAN:  Titus Dubin. Hopper, MD,FACP,FCCPDATE OF BIRTH:  12/30/1966   DATE OF ADMISSION:  05/06/2007  DATE OF DISCHARGE:  05/12/2007                               DISCHARGE SUMMARY   ADMITTING DIAGNOSES:  1. Fever.  2. Pancytopenia.  3. Abdominal pain.  4. Hypothyroidism.  5. Pituitary tumor.   DISCHARGE DIAGNOSES:  1. Fever, resolved  2. Pancytopenia, resolved except for residual anemia.  3. Abdominal pain, resolved.  4. Hypothyroidism.  5. Pituitary tumor.  6. Hypokalemia.   BRIEF HISTORY:  She presented with  1 day fever up to 103 associated  with abdominal pain.  The fever improved with ibuprofen but the  abdominal pain worsened.  Stopping the nonsteroidal resulted in  resolution of abdominal pain.   She had no localizing signs or symptoms by history.   She does have a history of dysfunctional bleeding, is followed by Dr.  Marcelle Overlie.  Apparently discussions has been given to endometrial ablation;  next follow-up is in September.   At the time of admission her white count was 1600 with hematocrit 24.7  and platelet count of 149,000.  The hematocrit dropped as low as 20.6  during the hospitalization.  Her platelet count reached a nadir of  96,000.   She initially was monitored without antibiotics.  Subsequently she was  seen by infectious disease and also hematology oncology.   It was Dr. Precious Reel opinion agreed with the institution of Primaxin and  continuation of neutropenic precautions.   The infectious disease specialist recommended continued antibiotic  coverage until cultures returned.  Subsequently urine and blood cultures  were all negative.  Doxycycline 100 mg p.o. b.i.d. was added.   Her hospital course was complicated by significant diarrhea.  C-  difficile studies on the  stool were negative.   Additionally, hospitalization was complicated by hypokalemia which was  attributed to the diarrhea.  Her potassium was initially 3.6 but dropped  as low as 2.8 at discharge.  Her BUN and creatinine remained normal as  did the glomerular filtration rate.   The smears did reveal atypical lymphocytes.   Hemoglobin electrophoresis revealed normal A2 hemoglobin with no  hemoglobin F or hemoglobin S.  Hemoglobin A was high normal at 97.6.  There were no other hemoglobin patterns noted.  Iron was low at 26 with  normals over 42.  Percent saturation was 16%.  B12 level and folate were  normal as was ferritin at 358.   HIV serology was nonreactive.   Pending are HIV RNA.   The urine had revealed few bacteria but as noted the culture was  negative.  During the hospitalization she was treated with vancomycin,  Primaxin and the doxycycline.  With negative cultures, she was kept on  the doxycycline.  Ova and parasites were also negative on stool.  Mono  screening was also negative.  Human parvovirus, V19, IgG and IgM were  pending.  Viral titers revealed an EBVCA  IgG of 6.36 which was elevated  and elevated EBV NA  IgG of 5.13 and an elevated EA IgG of 3.40.   ANA was positive with a speckled pattern at 1 to 160.   The C3 complement was 114 with normals over 88.  C4 complement was 37  with normals over 16.   On the day of discharge she was anxious to be discharged and had eaten  her breakfast without difficulty.  She was no longer having diarrhea and  had no GI symptoms.   The potassium had dropped from 3.1 on 8/8 to 2.8.  She was not on  diuretics.  GFR was normal at over 60 and creatinine was 0.51.   The temperature was 97.8, pulse 57 and 90 with no irregularities,  respiratory 16.  Blood pressure of 145/87.  O2 sats were 100 percent on  room air.  She had grade 1 systolic murmur.  There is no increased work  of breathing.  Bowel sounds were active.  Abdomen was  nontender.  She  had no peripheral edema.   Hematocrit was stable with 21.7 with a prior value of 20.6.  Platelet  counts had climbed to 150,000 and white count was 8000.   She was discharged on her home medicines of:  1. L-thyroxin 100  micrograms daily  2. Parlodel 2.5 mg daily  3. Ferrous sulfate.  4. She was  not to be discharged on antibiotics as per Dr. Orvan Falconer.      He plans to follow up parvovirus CMV studies and HIV final immune      studies.   She will be placed on KCl 20 mEq b.i.d. and have a potassium drawn at  [facility] lab on Monday 8/11.  The dictation was performed in front  of the patient to verify continuity of care.   She will have an appointment to follow-up with Dr. Precious Reel PA Tuesday,  August 19 labs at 9 and an appointment at 9:30 and an appointment with  Dr. Darrold Span 11:30 after labs 11:00 a.m.   DISCHARGE STATUS:  Is improved.  Prognosis depends on her potential  recurrence of the febrile illness and her response to definitive  intervention for the chronic anemia.      Titus Dubin. Alwyn Ren, MD,FACP,FCCP  Electronically Signed     WFH/MEDQ  D:  05/12/2007  T:  05/13/2007  Job:  [id number]   cc:   Gregary Signs A. Everardo All, MD  [address]  [place]  [place] [address]   Lennis P. Darrold Span, M.D.  Fax: [phone]   Cliffton Asters, M.D.  Fax: [phone]   Duke Salvia. Marcelle Overlie, M.D.  Fax: [phone]

## 2011-02-15 NOTE — Consult Note (Signed)
Michelle Huffman, Michelle Huffman             ACCOUNT NO.:  [id number]   MEDICAL RECORD NO.:  [id number]          PATIENT TYPE:  INP   LOCATION:  1335                         FACILITY:  [facility]   PHYSICIAN:  Lennis P. Darrold Span, M.D.DATE OF BIRTH:  1966-07-18   DATE OF CONSULTATION:  05/07/2007  DATE OF DISCHARGE:                                 CONSULTATION   The patient is a very pleasant 45 year old African-American lady seen in  consultation at the request of the hospitalist service for __________ .  Primary physician is Dr. Romero Belling, and GYN is Dr. Richarda Overlie.  The patient was admitted through the [facility] Emergency Department,  August 3 to May 07, 2007, with temperature up to 103 degrees, some  headache and some lower abdominal pain for about 24 hours.  She had been  in her usual generally good health until sudden onset of these symptoms.  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  CBC on admission had white count of 1.6, ANC  1.0, hemoglobin 8.1, platelets 149,000, MCV 63.9, segs 64, lymphs 30,  monos 4, EST basos 0.  Other evaluation at this point, CMET normal  except for __________ of 79, including BUN 8, creatinine 0.8, total  protein 7.7, albumin 3.2, total bilirubin 0.7.  Mono spot negative.  One  blood culture pending.  Urine culture pending.  HIV pending.  Lipase 21.  Pregnancy negative.  Urinalysis:  Leukocyte esterase negative, white  cells 0-2, protein 100, antibody screen negative on a type and cross.  Her headache has essentially resolved, and the abdominal pain is gone.  She has not had any radiologic studies listed in the computer.  Repeat  CBC earlier this morning saw IV fluids, white count 1.5, ANC 1.1,  hemoglobin 7.7, platelets 124, segs 75, lymphs 15, monos 10.  Path  review of the smear, polychromasia, elliptocytes, teardrops, __________  , 2-5 schistocytes, 1-2% __________ .  Primaxin orders were given  this  morning, and she has had 1 dose T-max since arrival to the floor in  99/9, though she feels chronic obstructive lung disease now, and  temperature may be increasing.   The patient has a history of anemia may be for 10 years or more.  She  has been on over-the-counter iron on no consistent basis until about  February 2008, when Dr. Everardo All began ferrous sulfate x2-3 daily.  This  causes constipation, and she is not always compliant with that dose of  oral iron.  She has had extremely heavy menstrual periods for years,  last period 3 weeks ago.  The periods usually last 6 days with 2-3 days  where she has to change the pad every hour.  She has discontinued GYN  Dr. Marcelle Overlie for annual visit in September 2008.  She has had an IUD in  place for 7 years.  She denies other bleeding.  Mother has sickle trait,  and the patient does not know if she has been __________ .  She denies  increased shortness of breath, dyspnea on exertion, or  fatigue.  She  quote catches things easily but no recent infections prior to this  illness.   REVIEW OF SYSTEMS:  No other neurologic symptoms.  She has had  intentional weight loss of about 17 pounds recently by diet changes.  No  other fevers, sweats, chest pain.  No other pain.   PAST HISTORY:  1. No known drug allergies.  2. One pregnancy with a daughter, age 26.  3. She has had a pituitary tumor treated with medication.  4. IUD x7 years.  5. No other surgery.  6. No history of blood clots.  7. Hypothyroid on Synthroid managed by Dr. Everardo All.   She had a CBC from 21 January 2007, white count 3.9, segs 70%, hemoglobin  8.8, platelets 174,000, and large platelets noted.  Labs also from 21 January 2007, GOT 74, GPT 29, HIV negative.   FAMILY HISTORY:  Mother with anemia and sickle trait.  Father with  hypertension.  Brother and daughter healthy.   SOCIAL HISTORY:  She has lived in [place] from [place] for about  7 years.  Previously worked in  Designer, jewellery, but she has not worked  in the past __________ .  She smoked less than or equal to 1/2 pack  daily but stopped in February 2008.  She denies IV drugs.  Occasional  alcohol per the chart.  Daughter is presently living with the patient  __________ .   EXAM:  Pleasant overweight lady appears moderately ill and complains of  being cold, wrapped in blankets, looks her stated age.  She is a fair  historian with encouragement.  Respirations are not labored supine on room air.  Temperature 99.9,  heart rate 104 and regular, respirations 16, blood pressure 104/66, 100%  saturated on room air.  She has pale nail beds and pale mucous  membranes.  Pupils equal and reactive, not icteric.  Oral mucosa moist without  lesions.  No JVD.  No peripheral adenopathy.  LUNGS:  Clear anteriorly and posteriorly.  HEART:  Regular rate and rhythm without gallop.  Peripheral IV right  upper extremity site not remarkable.  ABDOMEN:  Obese, soft, nontender, few bowel sounds.  No appreciable  organomegaly.  LOWER EXTREMITIES:  No edema, cords, or tenderness and otherwise  unremarkable.   I have requested other prior CBCs from Dr. Everardo All now.   Peripheral smear reviewed.  No immature white cells, few atypical  __________ .  No hypersegmented polys.  Red cells hypochromic,  microcystic, some elliptocytes, a few fragmented cells, some probable  reticulocytes, platelets diminished, otherwise not remarkable.   IMPRESSION/RECOMMENDATIONS:  1. Leukopenia, nearly neutropenic:  These counts are lower than what I      have seen to be about her baseline from April 2008, as above (and      other counts requested now from Dr. Everardo All).  Drop in the white      count could well be secondary to infection rather than __________      cause.  Agree with Primaxin, neutropenic precautions and will      follow daily CBCs.  2. Anemia.  She is likely extremely iron deficient by her history.  We      will check  iron levels as well as B12 folate, sickle screen, labs      for hemolysis, heme check stools.  May need IV iron, and she may      need suppression by GYN  3. Mild thrombocytopenia which could also  be secondary to infection.      We will follow up on all the tests pending, __________ may be      needed depending on these results.  Please call if I can be of      assistance between my visits.  I will follow.      Lennis P. Darrold Span, M.D.  Electronically Signed     LPL/MEDQ  D:  05/07/2007  T:  05/07/2007  Job:  [id number]   cc:   Gregary Signs A. Everardo All, MD  [address]  [place]  [place] [address]   Duke Salvia. Marcelle Overlie, M.D.  Fax: [phone]

## 2011-02-15 NOTE — Op Note (Signed)
Michelle Huffman, Michelle Huffman             ACCOUNT NO.:  [id number]   MEDICAL RECORD NO.:  [id number]          PATIENT TYPE:  WOC   LOCATION:  WOC                          FACILITY:  [facility]   PHYSICIAN:  Lesly Dukes, M.D. DATE OF BIRTH:  1966/03/06   DATE OF PROCEDURE:  07/15/2008  DATE OF DISCHARGE:                               OPERATIVE REPORT   PREOPERATIVE DIAGNOSIS:  A 45 year old para 1-0-2-1 female with  menorrhagia causing anemia.   POSTOPERATIVE DIAGNOSES:  A 45 year old para 1-0-2-1 female with  menorrhagia causing anemia plus large submucosal fibroid of the anterior  uterine wall.   PROCEDURE:  Dilation hysteroscopy and hydrothermal ablation.   SURGEON:  Lesly Dukes, MD   ANESTHESIA:  General.   SPECIMENS:  None.   FINDINGS:  A 12- to 14-week sized uterus on bimanual under anesthesia,  tight cervical os, very large anterior submucosal fibroid encompassing  the much of the endometrial cavity.   PROCEDURE:  After informed consent was obtained, the patient was taken  to the operating room, where general anesthesia was induced.  The  patient was placed in dorsal lithotomy position and prepared and draped  in normal sterile fashion.  The bladder was emptied.  A bivalve speculum  was placed in the patient's vagina and the cervix was brought into view.  The anterior lip of the cervix was grasped with single-tooth tenaculum.  The cervix was gently dilated with #9 Hegar dilator, and the [employer] hydrothermal ablation hysteroscope was introduced.  Hysteroscopy was performed with the finding of the extremely large  fibroid encompassing most of the uterine cavity.  We ablated for 10  minutes according to [facility] Scientific guidelines for the product.  We  advanced the hysteroscope further at the end of 10-minute ablation and  found some area of bleeding.  We ablated for 4 more minutes, which is  under product guidelines.  There was an appropriate cool down  period.  At the end of the procedure, there was good hemostasis from the cervix.  All instruments were removed from the patient's vagina and all counts  were correct x2.  The patient went to the recovery room in stable  condition.      Lesly Dukes, M.D.  Electronically Signed     KHL/MEDQ  D:  07/15/2008  T:  07/16/2008  Job:  [id number]

## 2011-02-15 NOTE — Group Therapy Note (Signed)
NAMEBRIANNI, MANTHE NO.:  [id number]   MEDICAL RECORD NO.:  [id number]          PATIENT TYPE:  WOC   LOCATION:  [facility]                   FACILITY:  [facility]   PHYSICIAN:  Argentina Donovan, MD        DATE OF BIRTH:  19-May-1966   DATE OF SERVICE:  04/23/2008                                  CLINIC NOTE   The patient is being seen today, April 23, 2008, in the GYN clinic at  [facility] with complaints of heavy vaginal bleeding.  This patient  was referred from Dr. Marcelle Overlie at [facility] secondary to  insurance coverage.  The patient states that she has been dealing with  heavy vaginal bleeding for approximately 10 years now.  It began shortly  after receiving her ParaGard IUD after the birth of her child.  She  reports bleeding 9 days monthly for her periods, requiring her to use  super pads, and passing clots, approximately golf-ball-sized,  and  changing a pad every 30 minutes to an hour.  She is currently being  followed by hematology and oncology at [facility]  secondary to her iron deficiency anemia.  Her last documented hemoglobin  was 8.8, which was on June 2.  Her hematocrit was 28.8.  She has missed  several appointments with [place] and rescheduled secondary to this last  decrease in her hemoglobin.  The physician at [facility] strongly encouraged her to keep the appointment as they do  believe that her anemia is being caused by her menorrhagia.  On  examination today, the patient states that she is not currently having  vaginal bleeding, that her period was last week and lasted for 9 days.  It was a normal period for her that consisted as described previously.   She is not allergic to any medications.   Her current medications are Synthroid, __________, and Tandem.   Her immunizations are not up to date.  She does report, however, having  had the chicken pox.   The first day of her last menstrual  period was July 6.  She was 14 at  menarche.   CONTRACEPTIVE HISTORY:  She currently has an IUD in place; however, she  requires no future fertility and is interested in a bilateral tubal  sterilization.   OBSTETRICAL HISTORY:  She has been pregnant 3 times.  She has had 2  miscarriages.  She had a vaginal delivery 9 years ago.   GYNECOLOGICAL HISTORY:  She had a Pap smear in 2008.  She has had a  history of an abnormal Pap smear but is uncertain if any treatment is  needed.  She also had a mammogram in 2008, and she is unsure of those  results.  Release of information is being sent to we think Dr.  Marcelle Overlie  at [facility] for these results.  She has never had a blood  transfusion.  She has never had any operations.   FAMILY HISTORY:  Positive for diabetes in her grandmother and high blood  pressure in her father.   PERSONAL MEDICAL HISTORY:  She has thyroid disease and also a pituitary  tumor.   SOCIAL HISTORY:  She lives with her daughter, Festus Barren.  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  She denies smoking.  She does drink alcohol socially.   Systemic review is negative except for which has already been covered in  the HPI.   PHYSICAL EXAMINATION:  GENERAL:  On exam today, Marcey is a pleasant 45-  year-old [place] American female who appears to be her stated age.  She  is in no apparent distress.  VITAL SIGNS:  Her vital signs are stable.  Her blood pressure is 128/79.  She is afebrile.  Her weight today is 219.8.  PELVIC:  Her examination today is going to be problem focused.  On  the  examination of the genitalia, she is Tanner 5.  External genitalia is  without lesions.  Mucous membranes are pink.  There is no vaginal  bleeding noted in the vault.  Her cervix is easily visualized using a  Graves speculum, and ParaGard IUD strings are easily visualized  approximately 1 cm below the external cervical os.  The IUD strings were  grasped using ring forceps, and then the IUD  was removed in one solid  motion.  It was removed intact.  After the IUD the cervix was prepped  using Betadine, and an endometrial biopsy was performed using 2 passes.  The patient tolerated this procedure well.  Pathology was sent of what  was collected in the endometrial biopsy.   ASSESSMENT/PLAN:  1. Menorrhagia.  2. Anemia.   PLAN:  1. For menorrhagia, removal of ParaGard IUD today.  Plan for bilateral      tubal sterilization for future undesired fertility.  Endometrial      biopsy was sent.  2. Anemia.  Continue using Tandem as directed by [facility].  3. Ibuprofen 800 mg was administered and encouraged for cramping      secondary to the endometrial biopsy and IUD removal today.   The patient instructed to follow up in 2 weeks to review her endometrial  biopsy results, and at that time we will also do a preop for her tubal  sterilization and sign her 30-day Medicaid papers as well.     ______________________________  Maylon Cos, CNM    ______________________________  Argentina Donovan, MD    SS/MEDQ  D:  04/23/2008  T:  04/23/2008  Job:  [id number]

## 2011-05-17 ENCOUNTER — Emergency Department (HOSPITAL_COMMUNITY)
Admission: EM | Admit: 2011-05-17 | Discharge: 2011-05-17 | Disposition: A | Discharge status: Home / Self Care | Payer: Managed Care, Other (non HMO) | Attending: Emergency Medicine | Admitting: Emergency Medicine

## 2011-05-17 ENCOUNTER — Emergency Department (HOSPITAL_COMMUNITY): Payer: Managed Care, Other (non HMO)

## 2011-05-17 DIAGNOSIS — E039 Hypothyroidism, unspecified: Secondary | ICD-10-CM | POA: Insufficient documentation

## 2011-05-17 DIAGNOSIS — K219 Gastro-esophageal reflux disease without esophagitis: Secondary | ICD-10-CM | POA: Insufficient documentation

## 2011-05-17 DIAGNOSIS — R079 Chest pain, unspecified: Secondary | ICD-10-CM | POA: Insufficient documentation

## 2011-05-17 LAB — BASIC METABOLIC PANEL
BUN: 13 mg/dL (ref 6–23)
CO2: 26 mEq/L (ref 19–32)
Creatinine, Ser: 0.68 mg/dL (ref 0.50–1.10)
Sodium: 137 mEq/L (ref 135–145)

## 2011-05-17 LAB — CBC
HCT: 30.9 % — ABNORMAL LOW (ref 36.0–46.0)
MCH: 20.6 pg — ABNORMAL LOW (ref 26.0–34.0)
MCHC: 30.7 g/dL (ref 30.0–36.0)
RBC: 4.61 MIL/uL (ref 3.87–5.11)
RDW: 17.7 % — ABNORMAL HIGH (ref 11.5–15.5)
WBC: 4.9 10*3/uL (ref 4.0–10.5)

## 2011-05-17 LAB — DIFFERENTIAL
Basophils Absolute: 0 10*3/uL (ref 0.0–0.1)
Basophils Relative: 0 % (ref 0–1)
Eosinophils Absolute: 0.2 10*3/uL (ref 0.0–0.7)
Eosinophils Relative: 5 % (ref 0–5)
Neutrophils Relative %: 59 % (ref 43–77)

## 2011-06-09 ENCOUNTER — Other Ambulatory Visit: Payer: Self-pay | Admitting: Gastroenterology

## 2011-06-13 ENCOUNTER — Ambulatory Visit
Admission: RE | Admit: 2011-06-13 | Discharge: 2011-06-13 | Disposition: A | Discharge status: Home / Self Care | Payer: Managed Care, Other (non HMO) | Source: Ambulatory Visit | Attending: Gastroenterology | Admitting: Gastroenterology

## 2011-06-21 ENCOUNTER — Ambulatory Visit
Admission: RE | Admit: 2011-06-21 | Discharge: 2011-06-21 | Disposition: A | Discharge status: Home / Self Care | Payer: Managed Care, Other (non HMO) | Source: Ambulatory Visit | Attending: Family Medicine | Admitting: Family Medicine

## 2011-06-21 ENCOUNTER — Other Ambulatory Visit: Payer: Self-pay | Admitting: Family Medicine

## 2011-06-21 DIAGNOSIS — R1011 Right upper quadrant pain: Secondary | ICD-10-CM

## 2011-06-23 LAB — DIFFERENTIAL
Basophils Relative: 0
Eosinophils Absolute: 0
Monocytes Absolute: 0.3
Neutrophils Relative %: 86 — ABNORMAL HIGH

## 2011-06-23 LAB — CBC
HCT: 28.8 — ABNORMAL LOW
MCHC: 32.9
Platelets: 207
RDW: 16.9 — ABNORMAL HIGH

## 2011-06-24 ENCOUNTER — Other Ambulatory Visit (HOSPITAL_COMMUNITY): Payer: Self-pay | Admitting: Family Medicine

## 2011-07-01 LAB — POCT PREGNANCY, URINE: Operator id: 148111

## 2011-07-05 LAB — CBC
HCT: 22.1 — ABNORMAL LOW
MCV: 65 — ABNORMAL LOW
Platelets: 281
RDW: 22.7 — ABNORMAL HIGH

## 2011-07-05 LAB — TYPE AND SCREEN: Antibody Screen: NEGATIVE

## 2011-07-05 LAB — HEMOGLOBIN AND HEMATOCRIT, BLOOD: HCT: 24 — ABNORMAL LOW

## 2011-07-06 ENCOUNTER — Encounter (HOSPITAL_COMMUNITY)
Admission: RE | Admit: 2011-07-06 | Discharge: 2011-07-06 | Disposition: A | Discharge status: Home / Self Care | Payer: Managed Care, Other (non HMO) | Source: Ambulatory Visit | Attending: Family Medicine | Admitting: Family Medicine

## 2011-07-06 DIAGNOSIS — R109 Unspecified abdominal pain: Secondary | ICD-10-CM | POA: Insufficient documentation

## 2011-07-06 LAB — CBC
HCT: 25.5 — ABNORMAL LOW
Hemoglobin: 7.9 — CL
WBC: 5.8

## 2011-07-06 MED ORDER — TECHNETIUM TC 99M MEBROFENIN IV KIT
5.0000 | PACK | Freq: Once | INTRAVENOUS | Status: AC | PRN
  Administered 2011-07-06: 5 via INTRAVENOUS

## 2011-07-08 LAB — CBC
HCT: 26 % — ABNORMAL LOW (ref 36.0–46.0)
HCT: 30.2 % — ABNORMAL LOW (ref 36.0–46.0)
Hemoglobin: 8.1 g/dL — ABNORMAL LOW (ref 12.0–15.0)
Hemoglobin: 9.7 g/dL — ABNORMAL LOW (ref 12.0–15.0)
MCHC: 31.3 g/dL (ref 30.0–36.0)
MCHC: 32.2 g/dL (ref 30.0–36.0)
MCV: 64.4 fL — ABNORMAL LOW (ref 78.0–100.0)
RDW: 24.1 % — ABNORMAL HIGH (ref 11.5–15.5)
RDW: 28.4 % — ABNORMAL HIGH (ref 11.5–15.5)

## 2011-07-08 LAB — CROSSMATCH

## 2011-07-11 ENCOUNTER — Ambulatory Visit (INDEPENDENT_AMBULATORY_CARE_PROVIDER_SITE_OTHER): Payer: Managed Care, Other (non HMO) | Admitting: General Surgery

## 2011-07-11 ENCOUNTER — Encounter (INDEPENDENT_AMBULATORY_CARE_PROVIDER_SITE_OTHER): Payer: Self-pay | Admitting: General Surgery

## 2011-07-11 ENCOUNTER — Inpatient Hospital Stay (HOSPITAL_COMMUNITY): Admission: RE | Admit: 2011-07-11 | Payer: Managed Care, Other (non HMO) | Source: Ambulatory Visit

## 2011-07-11 VITALS — BP 136/98 | HR 64 | Temp 96.0°F | Resp 20 | Ht 64.5 in | Wt 224.1 lb

## 2011-07-11 DIAGNOSIS — K828 Other specified diseases of gallbladder: Secondary | ICD-10-CM

## 2011-07-11 NOTE — Progress Notes (Signed)
Chief Complaint  Patient presents with  . Other    new pt eval of GB abnormal HIDA scan and symptomatic    HPI Michelle Huffman is a 45 y.o. female.   HPI  She is referred by Dr. Merri Brunette because of biliary dyskinesia. In August of this year she developed pressure type lower chest pain radiating to her back with nausea.  She ended up going to the emergency department and had a cardiac etiology ruled out. She has been seen by Dr. Katrinka Blazing and Dr. Madilyn Fireman. She has been evaluated upper GI study which was unremarkable. An abdominal ultrasound did not demonstrate gallstones. A nuclear medicine hepatobiliary scan demonstrated a low gallbladder ejection fraction of 4.5% with normal being greater than 30%. She states fatty foods make her symptoms worse. No family history of gallbladder disease. She is here with her cousin. Past Medical History  Diagnosis Date  . Blood transfusion   . Chest pain   . Anemia   . Thyroid disease     hypothyroid    Past Surgical History  Procedure Date  . Knee surgery 2010    laproscopic right knee  . Abdominal hysterectomy 2009    History reviewed. No pertinent family history.  Social History History  Substance Use Topics  . Smoking status: Former Games developer  . Smokeless tobacco: Never Used  . Alcohol Use: Yes    No Known Allergies  No current outpatient prescriptions on file.    Review of Systems Review of Systems  Constitutional: Negative.   Respiratory: Positive for shortness of breath (When climbing stairs when she has the pain.).   Cardiovascular: Positive for chest pain (Lower chest, upper abdomen.).  Gastrointestinal:       No diarrhea. No heartburn.  Genitourinary: Negative.     Blood pressure 136/98, pulse 64, temperature 96 F (35.6 C), resp. rate 20, height 5' 4.5" (1.638 m), weight 224 lb 2 oz (101.662 kg).  Physical Exam Physical Exam  Constitutional: No distress.       Obese.  Neck: Neck supple.  Cardiovascular: Normal rate  and regular rhythm.   No murmur heard. Pulmonary/Chest: Effort normal and breath sounds normal.  Abdominal: Soft. Bowel sounds are normal. She exhibits no distension and no mass. There is tenderness (mild ruq ).  Musculoskeletal: Normal range of motion. She exhibits no edema.  Lymphadenopathy:    She has no cervical adenopathy.    Data Reviewed Office note from Dr. Madilyn Fireman and Dr. Katrinka Blazing. Ultrasound, upper GI, nuclear medicine study results.  Assessment    Symptomatic biliary dyskinesia.    Plan    Strict low-fat nonfat diet. I recommended a laparoscopic cholecystectomy.  I have explained the procedure, risks, and aftercare of cholecystectomy.  Risks include but are not limited to bleeding, infection, wound problems, anesthesia, diarrhea, bile leak, injury to common bile duct/liver/intestine.  Aftercare was discussed.  She seems to understand and agrees to proceed. Literature was given to her.        Norah Devin J 07/11/2011, 5:29 PM

## 2011-07-11 NOTE — Patient Instructions (Signed)
You have biliary dyskinesia. Stay on a low-fat diet.

## 2011-07-18 LAB — OVA AND PARASITE EXAMINATION

## 2011-07-18 LAB — CBC
HCT: 20.6 — ABNORMAL LOW
HCT: 21.1 — ABNORMAL LOW
HCT: 21.4 — ABNORMAL LOW
HCT: 21.7 — ABNORMAL LOW
HCT: 22.6 — ABNORMAL LOW
HCT: 23.9 — ABNORMAL LOW
HCT: 24.2 — ABNORMAL LOW
HCT: 24.7 — ABNORMAL LOW
Hemoglobin: 7 — CL
Hemoglobin: 7.1 — CL
Hemoglobin: 7.1 — CL
Hemoglobin: 7.2 — CL
Hemoglobin: 7.7 — CL
Hemoglobin: 7.7 — CL
MCHC: 32.6
MCHC: 32.8
MCHC: 33.1
MCHC: 33.5
MCV: 63.4 — ABNORMAL LOW
MCV: 63.8 — ABNORMAL LOW
MCV: 64 — ABNORMAL LOW
MCV: 64.2 — ABNORMAL LOW
Platelets: 111 — ABNORMAL LOW
Platelets: 125 — ABNORMAL LOW
Platelets: 127 — ABNORMAL LOW
Platelets: 149 — ABNORMAL LOW
RBC: 3.28 — ABNORMAL LOW
RBC: 3.4 — ABNORMAL LOW
RDW: 19.4 — ABNORMAL HIGH
RDW: 19.6 — ABNORMAL HIGH
RDW: 19.9 — ABNORMAL HIGH
WBC: 1.5 — ABNORMAL LOW
WBC: 1.6 — ABNORMAL LOW
WBC: 1.7 — ABNORMAL LOW
WBC: 2.6 — ABNORMAL LOW

## 2011-07-18 LAB — ANTI-DNA ANTIBODY, DOUBLE-STRANDED: ds DNA Ab: <1 IU/mL (ref <5)

## 2011-07-18 LAB — PROTIME-INR: INR: 1.2

## 2011-07-18 LAB — CROSSMATCH

## 2011-07-18 LAB — OCCULT BLOOD X 1 CARD TO LAB, STOOL
Fecal Occult Bld: NEGATIVE
Fecal Occult Bld: POSITIVE

## 2011-07-18 LAB — LIPASE, BLOOD: Lipase: 21

## 2011-07-18 LAB — CULTURE, BLOOD (ROUTINE X 2)
Culture: NO GROWTH
Culture: NO GROWTH
Culture: NO GROWTH

## 2011-07-18 LAB — BASIC METABOLIC PANEL
BUN: 2 — ABNORMAL LOW
BUN: 7
CO2: 20
Calcium: 7.5 — ABNORMAL LOW
Calcium: 8 — ABNORMAL LOW
Chloride: 111
Chloride: 114 — ABNORMAL HIGH
Creatinine, Ser: 0.6
GFR calc Af Amer: >60
GFR calc non Af Amer: >60
Potassium: 2.8 — ABNORMAL LOW
Potassium: 3.4 — ABNORMAL LOW
Sodium: 137
Sodium: 137

## 2011-07-18 LAB — COMPREHENSIVE METABOLIC PANEL
ALT: 35
AST: 79 — ABNORMAL HIGH
Albumin: 3.2 — ABNORMAL LOW
Alkaline Phosphatase: 74
BUN: 8
Chloride: 108
GFR calc Af Amer: >60
Potassium: 3.6
Sodium: 136
Total Bilirubin: 0.7

## 2011-07-18 LAB — DIFFERENTIAL
Basophils Relative: 0
Basophils Relative: 0
Basophils Relative: 0
Basophils Relative: 0
Basophils Relative: 0
Basophils Relative: 0
Eosinophils Absolute: 0
Eosinophils Absolute: 0.2
Eosinophils Relative: 0
Eosinophils Relative: 0
Eosinophils Relative: 0
Eosinophils Relative: 1
Eosinophils Relative: 2
Lymphocytes Relative: 15
Lymphocytes Relative: 38
Lymphocytes Relative: 38
Lymphs Abs: 0.3 — ABNORMAL LOW
Lymphs Abs: 1.4
Monocytes Absolute: 0.2
Monocytes Absolute: 0.3
Monocytes Absolute: 0.6
Monocytes Relative: 10
Monocytes Relative: 10
Monocytes Relative: 11
Monocytes Relative: 17 — ABNORMAL HIGH
Neutro Abs: 0.5 — ABNORMAL LOW
Neutro Abs: 0.9 — ABNORMAL LOW
Neutro Abs: 1.1 — ABNORMAL LOW
Neutro Abs: 2.7
Neutro Abs: 5.4
Neutrophils Relative %: 48
Neutrophils Relative %: 52

## 2011-07-18 LAB — HIV ANTIBODY (ROUTINE TESTING W REFLEX): HIV: NONREACTIVE

## 2011-07-18 LAB — VANCOMYCIN, TROUGH: Vancomycin Tr: 11.6

## 2011-07-18 LAB — FECAL LACTOFERRIN, QUANT: Fecal Lactoferrin: NEGATIVE

## 2011-07-18 LAB — EPSTEIN-BARR VIRUS VCA ANTIBODY PANEL: EBV VCA IgM: 0.57

## 2011-07-18 LAB — VITAMIN B12: Vitamin B-12: 466 (ref 211–911)

## 2011-07-18 LAB — IRON AND TIBC: Saturation Ratios: 16 — ABNORMAL LOW

## 2011-07-18 LAB — C3 COMPLEMENT: C3 Complement: 114

## 2011-07-18 LAB — ABO/RH: ABO/RH(D): A POS

## 2011-07-18 LAB — URINALYSIS, ROUTINE W REFLEX MICROSCOPIC
Bilirubin Urine: NEGATIVE
Glucose, UA: NEGATIVE
Hgb urine dipstick: NEGATIVE
Specific Gravity, Urine: 1.019
Urobilinogen, UA: 1

## 2011-07-18 LAB — CLOSTRIDIUM DIFFICILE EIA
C difficile Toxins A+B, EIA: NEGATIVE
C difficile Toxins A+B, EIA: NEGATIVE

## 2011-07-18 LAB — APTT: aPTT: 37

## 2011-07-18 LAB — LACTATE DEHYDROGENASE: LDH: 434 — ABNORMAL HIGH

## 2011-07-18 LAB — PLATELET ANTIBODIES, DIRECT: Platelet IgG Ab, Direct: NEGATIVE

## 2011-07-18 LAB — URINE CULTURE
Colony Count: NO GROWTH
Culture: NO GROWTH
Special Requests: NEGATIVE

## 2011-07-18 LAB — FERRITIN: Ferritin: 358 — ABNORMAL HIGH (ref 10–291)

## 2011-07-18 LAB — URINE MICROSCOPIC-ADD ON

## 2011-07-18 LAB — HAPTOGLOBIN: Haptoglobin: 230 — ABNORMAL HIGH

## 2011-07-18 LAB — ANA: Anti Nuclear Antibody(ANA): POSITIVE — AB

## 2011-07-18 LAB — PREGNANCY, URINE: Preg Test, Ur: NEGATIVE

## 2011-07-18 LAB — HEMOGLOBINOPATHY EVALUATION
Hgb A2 Quant: 2.4
Hgb A: 97.6 %

## 2011-07-18 LAB — PARVOVIRUS B19 ANTIBODY, IGG AND IGM: Parovirus B19 IgM Abs: <0.9 Index (ref <0.9)

## 2011-07-18 LAB — HIV-1 RNA QUANT-NO REFLEX-BLD: HIV 1 RNA Quant: <50 copies/mL (ref <50)

## 2011-07-18 LAB — FOLATE: Folate: 16.8

## 2011-07-18 LAB — CMV IGM: CMV IgM: <0.9 Index (ref <0.90)

## 2011-07-20 ENCOUNTER — Other Ambulatory Visit (HOSPITAL_COMMUNITY): Payer: Self-pay | Admitting: Family Medicine

## 2011-07-20 ENCOUNTER — Encounter (HOSPITAL_COMMUNITY)
Admission: RE | Admit: 2011-07-20 | Discharge: 2011-07-20 | Disposition: A | Discharge status: Home / Self Care | Payer: Managed Care, Other (non HMO) | Source: Ambulatory Visit | Attending: General Surgery | Admitting: General Surgery

## 2011-07-20 DIAGNOSIS — Z1231 Encounter for screening mammogram for malignant neoplasm of breast: Secondary | ICD-10-CM

## 2011-07-20 LAB — COMPREHENSIVE METABOLIC PANEL
ALT: 18 U/L (ref 0–35)
AST: 24 U/L (ref 0–37)
Albumin: 3.4 g/dL — ABNORMAL LOW (ref 3.5–5.2)
CO2: 26 mEq/L (ref 19–32)
Calcium: 9 mg/dL (ref 8.4–10.5)
GFR calc non Af Amer: 82 mL/min — ABNORMAL LOW (ref >90)
Sodium: 143 mEq/L (ref 135–145)

## 2011-07-20 LAB — PROTIME-INR
INR: 0.94 (ref 0.00–1.49)
Prothrombin Time: 12.8 seconds (ref 11.6–15.2)

## 2011-07-20 LAB — DIFFERENTIAL
Basophils Absolute: 0 10*3/uL (ref 0.0–0.1)
Eosinophils Relative: 5 % (ref 0–5)
Monocytes Absolute: 0.4 10*3/uL (ref 0.1–1.0)
Monocytes Relative: 8 % (ref 3–12)
Neutrophils Relative %: 61 % (ref 43–77)

## 2011-07-20 LAB — CBC
HCT: 27.7 % — ABNORMAL LOW (ref 36.0–46.0)
Hemoglobin: 8.7 g/dL — ABNORMAL LOW (ref 12.0–15.0)
RDW: 18.6 % — ABNORMAL HIGH (ref 11.5–15.5)
WBC: 5.4 10*3/uL (ref 4.0–10.5)

## 2011-07-20 LAB — SURGICAL PCR SCREEN: Staphylococcus aureus: POSITIVE — AB

## 2011-07-21 ENCOUNTER — Ambulatory Visit (HOSPITAL_COMMUNITY): Payer: Managed Care, Other (non HMO)

## 2011-07-21 ENCOUNTER — Observation Stay (HOSPITAL_COMMUNITY)
Admission: RE | Admit: 2011-07-21 | Discharge: 2011-07-22 | Disposition: A | Discharge status: Home / Self Care | Payer: Managed Care, Other (non HMO) | Source: Ambulatory Visit | Attending: General Surgery | Admitting: General Surgery

## 2011-07-21 ENCOUNTER — Other Ambulatory Visit (INDEPENDENT_AMBULATORY_CARE_PROVIDER_SITE_OTHER): Payer: Self-pay | Admitting: General Surgery

## 2011-07-21 DIAGNOSIS — K811 Chronic cholecystitis: Secondary | ICD-10-CM

## 2011-07-21 DIAGNOSIS — F411 Generalized anxiety disorder: Secondary | ICD-10-CM | POA: Insufficient documentation

## 2011-07-21 DIAGNOSIS — E669 Obesity, unspecified: Secondary | ICD-10-CM | POA: Insufficient documentation

## 2011-07-21 DIAGNOSIS — Z01812 Encounter for preprocedural laboratory examination: Secondary | ICD-10-CM | POA: Insufficient documentation

## 2011-07-21 HISTORY — PX: CHOLECYSTECTOMY: SHX55

## 2011-07-23 NOTE — Op Note (Signed)
Michelle Huffman, Michelle Huffman             ACCOUNT NO.:  [id number]  MEDICAL RECORD NO.:  [id number]  LOCATION:  SDSC                         FACILITY:  [facility]  PHYSICIAN:  Adolph Pollack, M.D.DATE OF BIRTH:  October 23, 1965  DATE OF PROCEDURE: DATE OF DISCHARGE:                              OPERATIVE REPORT   PREOPERATIVE DIAGNOSIS:  Biliary dyskinesia.  POSTOPERATIVE DIAGNOSIS:  Biliary dyskinesia.  PROCEDURE:  Laparoscopic cholecystectomy with intraoperative cholangiogram.  SURGEON:  Adolph Pollack, MD  ASSISTANT:  Lodema Pilot, DO  ANESTHESIA:  General.  INDICATION:  Michelle Huffman is a 45 year old female who has been having pressure-type lower chest pain radiating to her back with nausea. Cardiac etiology has been ruled out.  She had an upper GI study and abdominal ultrasound.  Neither of these demonstrate any significant pathology, in fact there were no gallstones noted on the abdominal ultrasound; however, a nuclear medicine hepatobiliary scan demonstrated a significant gallbladder dysfunction with an ejection fraction of 4.5% and normal being greater than 30%.  Her symptoms are exacerbated by fatty foods.  She now presents for elective laparoscopic cholecystectomy for biliary dyskinesia.  We discussed the procedure risks and success rate (up to 80% of people have relief of the symptoms).  TECHNIQUE:  She was seen in the holding area then brought to the operating room, placed supine on the operating table, and general anesthetic was administered.  The abdominal wall was sterilely prepped and draped.  In the subumbilical region, Marcaine solution was infiltrated into the subcutaneous tissues.  A small subumbilical incision was made through the skin, subcutaneous tissue, fascia, and peritoneum entering the peritoneal cavity under direct vision.  A pursestring suture of 0-Vicryl was placed around the fascial edges.  A Hasson trocar was introduced into the peritoneal cavity  and pneumoperitoneum was created by insufflation of CO2 gas.  Next, laparoscope was introduced.  There was no underlying bleeding or organ injury.  There were some omental adhesions to the periumbilical area.  The patient was then placed in a reverse Trendelenburg position and the right side was tilted up slightly.  A 5-mm trocar was placed through an epigastric incision and two 5-mm trocars were placed in the right upper quadrant.  Fatty infiltration to the liver was noted.  The gallbladder was grasped and there were no acute inflammatory changes and no significant adhesions present.  The fundus of the gallbladder was retracted towards the right shoulder.  The infundibulum was grasped and using dissection on the gallbladder, I mobilized infundibulum, retracted it laterally exposing the cystic duct as well as an anterior branch of the cystic artery running anterior and intimate with the cystic duct.  I subsequently was able to isolate the anterior branch of the cystic artery close to the gallbladder and clip it.  I then created a window around the cystic duct.  A clip was placed at the gallbladder neck.  A small incision was made in the cystic duct.  A cholangiocatheter was passed through the anterior abdominal wall and placed into the cystic duct and a cholangiogram was performed.  Under real time fluoroscopy, dilute contrast was injected into the cystic duct, which was of long length.  The common hepatic, right  and left hepatic, common bile ducts all filled and the bile drained promptly into the duodenum.  No obvious evidence of obstruction was noted.  Final reports pending the radiologist interpretation.  Following this, I removed the cholangiocatheter, I clipped the cystic duct as well as the anterior branch of the cystic artery with 3 clips on the biliary side and divided the cystic duct.  I then identified a posterior branch of the cystic duct and created a window around it.   It was clipped and divided.  The gallbladder was dissected free from liver using electrocautery.  A small puncture was made in the gallbladder with some leakage of bile, which was evacuated.  The gallbladder was then placed in an Endopouch bag.  I then irrigated out the gallbladder fossa, controlled bleeding with electrocautery.  The gallbladder was then removed through the subumbilical incision and sent to Pathology.  I reinspected the gallbladder fossa and noted no bleeding or bile leak or evidence of organ injury.  A piece of Surgicel was placed in the gallbladder fossa.  Irrigation fluid was evacuated as much as possible.  I then removed the subumbilical trocar and closed the fascial defect under laparoscopic vision by tightening up and tying down the pursestring suture.  Remaining trocars removed and pneumoperitoneum was released.  These skin incisions were closed with 4-0 Monocryl subcuticular stitches.  Steri-Strips and sterile dressings were applied.  She tolerated the procedure well without any apparent complications and was taken to the recovery room in satisfactory condition.     Adolph Pollack, M.D.    Michelle Huffman  D:  07/21/2011  T:  07/21/2011  Job:  [id number]  cc:   Dario Guardian, M.D.  Electronically Signed by Avel Peace M.D. on 07/23/2011 01:07:03 AM

## 2011-07-26 ENCOUNTER — Telehealth (INDEPENDENT_AMBULATORY_CARE_PROVIDER_SITE_OTHER): Payer: Self-pay | Admitting: General Surgery

## 2011-07-26 MED ORDER — PROMETHAZINE HCL 12.5 MG PO TABS: 12.5000 mg | ORAL_TABLET | Freq: Four times a day (QID) | ORAL | Status: AC | PRN

## 2011-07-26 NOTE — Telephone Encounter (Addendum)
Patient having nausea s/p lap chole on 07/21/11. Has not been eating much or eating with her pain medicine. I advised she shouldn't take pain meds on an empty stomach. She is asking for medicine for nausea. Please advise. Per Dr Abbey Chatters ok to call in phenergan. Called to [facility]. Patient made aware.

## 2011-08-04 NOTE — Discharge Summary (Signed)
  NAMEMANETTE, DOTO             ACCOUNT NO.:  [id number]  MEDICAL RECORD NO.:  [id number]  LOCATION:  5125                         FACILITY:  [facility]  PHYSICIAN:  Adolph Pollack, M.D.DATE OF BIRTH:  1966/06/22  DATE OF ADMISSION:  07/21/2011 DATE OF DISCHARGE:  07/22/2011                              DISCHARGE SUMMARY   FINAL DIAGNOSIS:  Biliary dyskinesia.  SECONDARY DIAGNOSES: 1. Hypothyroidism. 2. Chronic anemia.  PROCEDURE:  Laparoscopic cholecystectomy with cholangiogram.  REASON FOR ADMISSION:  Ms. Wakefield is a 45 year old female who is having biliary colic type symptoms and was found to have biliary dyskinesia.  She was admitted for elective cholecystectomy.  Procedure risks and potential success rates were discussed with her preoperatively.  HOSPITAL COURSE:  She underwent the operation, was scheduled to go home the same day, but had some nausea, which kept her overnight.  However, the next day this improved and she was able to be discharged.  DISPOSITION:  Discharged to home in satisfactory condition.  She was given discharge instructions, which gave her activity restrictions and dietary restrictions as well.  She is going to continue all of her home medicines as well as her Vicodin for pain.  We will have her follow up in the office in 2 weeks for postop check.     Adolph Pollack, M.D.     Kari Baars  D:  07/29/2011  T:  07/29/2011  Job:  [id number]  Electronically Signed by Avel Peace M.D. on 08/04/2011 09:03:04 PM

## 2011-08-11 ENCOUNTER — Telehealth (INDEPENDENT_AMBULATORY_CARE_PROVIDER_SITE_OTHER): Payer: Self-pay

## 2011-08-11 ENCOUNTER — Encounter (INDEPENDENT_AMBULATORY_CARE_PROVIDER_SITE_OTHER): Payer: Self-pay

## 2011-08-11 NOTE — Telephone Encounter (Signed)
Pt called in to the office b/c she was asking why she hasn't got a return call from leaving a message on Bernie's voicemail. I explained that Cyndra Numbers had an emergency and she had to leave the office for a few days but will return Friday. The pt was concerned b/c she went back to work today after having her gallbladder surgery on 07-21-11 and not being checked by Dr Abbey Chatters yet. The pt has an appt for 08-17-11 and I tried to explain that a lot of our pt's go back to work without being checked by our doctors first b/c as long as their job does not require them being seen first it's ok. I offered to get her a note faxed to her employer today if needed but she never answered me. The pt said she was still having some discomfort at her right side and her chest still. I advised that she would still be a little sore after surgery and that it could take up to 6-8 wks to really feel like herself again after surgery. I explained to her that with her diagnosis of biliary dyskenesia she could still have some her symptoms even after surgery. The pt was telling me she did a lot of walking at her job and I advised that walking after surgery was good for her. I confirmed her appt for the 08-17-11./ [facility]

## 2011-08-11 NOTE — Telephone Encounter (Signed)
Pt called me back b/c she is concerned b/c her manager told her she needed to leave work now until she is seen by her physician. I did ask Kendell Bane that is working with Dr Abbey Chatters this am about seeing the pt and what was going on with her job but there is no way the pt could be seen today by Dr Abbey Chatters. I did explain that the pt is being sent home until she is seen by Dr Abbey Chatters and has a note to return to work. The pt is concerned about this not being covered by her disability company and I did advise that is out of our control b/c her disability company might not cover her being out of work starting today until she is seen next week by Dr Abbey Chatters. I did try to offer sending her job a return to work note with Dr Maris Berger signature on it to her manager Miachel Roux. The pt does want [place] to fax a note to his attention [phone]. I will fax the note to her now.Hulda Humphrey

## 2011-08-17 ENCOUNTER — Encounter (INDEPENDENT_AMBULATORY_CARE_PROVIDER_SITE_OTHER): Payer: Self-pay | Admitting: General Surgery

## 2011-08-17 ENCOUNTER — Ambulatory Visit (INDEPENDENT_AMBULATORY_CARE_PROVIDER_SITE_OTHER): Payer: Managed Care, Other (non HMO) | Admitting: General Surgery

## 2011-08-17 VITALS — BP 146/100 | HR 64 | Temp 98.1°F | Resp 18 | Ht 64.5 in | Wt 224.5 lb

## 2011-08-17 DIAGNOSIS — Z9889 Other specified postprocedural states: Secondary | ICD-10-CM

## 2011-08-17 NOTE — Progress Notes (Signed)
She is here for a postop visit following laparoscopic cholecystectomy for biliary dyskinesia 07/21/2011.  Her preop epigastric and lower chest pain is better.  Her midback pain is the same.   Diet is being tolerated, bowels are moving.  RUQ incisions are sore..  PE:  ABD:  Soft, incisions clean/dry/intact and solid.  Assessment:  Some sx improvement following lap chole.  Plan:  Lowfat diet recommended.  Activities as tolerated.  Return visit prn.

## 2011-08-17 NOTE — Patient Instructions (Signed)
Resume normal activities as tolerated.  Lowfat diet.

## 2011-10-10 ENCOUNTER — Telehealth (INDEPENDENT_AMBULATORY_CARE_PROVIDER_SITE_OTHER): Payer: Self-pay

## 2011-10-10 ENCOUNTER — Encounter (INDEPENDENT_AMBULATORY_CARE_PROVIDER_SITE_OTHER): Payer: Self-pay

## 2011-10-10 ENCOUNTER — Other Ambulatory Visit (INDEPENDENT_AMBULATORY_CARE_PROVIDER_SITE_OTHER): Payer: Self-pay | Admitting: General Surgery

## 2011-10-10 DIAGNOSIS — R197 Diarrhea, unspecified: Secondary | ICD-10-CM

## 2011-10-10 MED ORDER — CHOLESTYRAMINE 4 G PO PACK: 1.0000 | PACK | Freq: Two times a day (BID) | ORAL | Status: DC

## 2011-10-10 NOTE — Telephone Encounter (Signed)
Pt called to c/o chronic diarrhea.  Her surgery was in October of 2012, but the diarrhea is a new development.  [facility]  [facility].  Pt also requested a letter for her employer explaining her condition and requesting understanding for more time/breaks while at work.

## 2013-05-13 ENCOUNTER — Emergency Department (HOSPITAL_COMMUNITY)
Admission: EM | Admit: 2013-05-13 | Discharge: 2013-05-13 | Disposition: A | Discharge status: Home / Self Care | Payer: BC Managed Care – PPO | Attending: Emergency Medicine | Admitting: Emergency Medicine

## 2013-05-13 ENCOUNTER — Encounter (HOSPITAL_COMMUNITY): Payer: Self-pay | Admitting: Emergency Medicine

## 2013-05-13 ENCOUNTER — Emergency Department (HOSPITAL_COMMUNITY): Payer: BC Managed Care – PPO

## 2013-05-13 DIAGNOSIS — R05 Cough: Secondary | ICD-10-CM

## 2013-05-13 DIAGNOSIS — R509 Fever, unspecified: Secondary | ICD-10-CM | POA: Insufficient documentation

## 2013-05-13 DIAGNOSIS — Z79899 Other long term (current) drug therapy: Secondary | ICD-10-CM | POA: Insufficient documentation

## 2013-05-13 DIAGNOSIS — R059 Cough, unspecified: Secondary | ICD-10-CM

## 2013-05-13 DIAGNOSIS — Z87891 Personal history of nicotine dependence: Secondary | ICD-10-CM | POA: Insufficient documentation

## 2013-05-13 DIAGNOSIS — R079 Chest pain, unspecified: Secondary | ICD-10-CM | POA: Insufficient documentation

## 2013-05-13 DIAGNOSIS — R0602 Shortness of breath: Secondary | ICD-10-CM | POA: Insufficient documentation

## 2013-05-13 DIAGNOSIS — Z862 Personal history of diseases of the blood and blood-forming organs and certain disorders involving the immune mechanism: Secondary | ICD-10-CM | POA: Insufficient documentation

## 2013-05-13 DIAGNOSIS — I10 Essential (primary) hypertension: Secondary | ICD-10-CM | POA: Insufficient documentation

## 2013-05-13 DIAGNOSIS — Z8639 Personal history of other endocrine, nutritional and metabolic disease: Secondary | ICD-10-CM | POA: Insufficient documentation

## 2013-05-13 HISTORY — DX: Essential (primary) hypertension: I10

## 2013-05-13 LAB — CBC
HCT: 29.9 % — ABNORMAL LOW (ref 36.0–46.0)
Hemoglobin: 9.2 g/dL — ABNORMAL LOW (ref 12.0–15.0)
MCHC: 30.8 g/dL (ref 30.0–36.0)
RBC: 4.5 MIL/uL (ref 3.87–5.11)
WBC: 5.4 10*3/uL (ref 4.0–10.5)

## 2013-05-13 LAB — BASIC METABOLIC PANEL
BUN: 14 mg/dL (ref 6–23)
CO2: 25 mEq/L (ref 19–32)
Chloride: 104 mEq/L (ref 96–112)
Glucose, Bld: 91 mg/dL (ref 70–99)
Potassium: 3.5 mEq/L (ref 3.5–5.1)
Sodium: 137 mEq/L (ref 135–145)

## 2013-05-13 LAB — D-DIMER, QUANTITATIVE: D-Dimer, Quant: 0.69 ug/mL-FEU — ABNORMAL HIGH (ref 0.00–0.48)

## 2013-05-13 MED ORDER — ONDANSETRON HCL 4 MG/2ML IJ SOLN
4.0000 mg | Freq: Once | INTRAMUSCULAR | Status: AC
  Administered 2013-05-13: 4 mg via INTRAVENOUS
  Filled 2013-05-13: qty 2

## 2013-05-13 MED ORDER — BENZONATATE 100 MG PO CAPS
200.0000 mg | ORAL_CAPSULE | Freq: Once | ORAL | Status: AC
  Administered 2013-05-13: 200 mg via ORAL
  Filled 2013-05-13: qty 2

## 2013-05-13 MED ORDER — OXYCODONE-ACETAMINOPHEN 5-325 MG PO TABS
2.0000 | ORAL_TABLET | Freq: Once | ORAL | Status: AC
  Administered 2013-05-13: 2 via ORAL
  Filled 2013-05-13: qty 2

## 2013-05-13 MED ORDER — BENZONATATE 100 MG PO CAPS: 100.0000 mg | ORAL_CAPSULE | Freq: Three times a day (TID) | ORAL | Status: DC

## 2013-05-13 MED ORDER — IOHEXOL 350 MG/ML SOLN
100.0000 mL | Freq: Once | INTRAVENOUS | Status: AC | PRN
Start: 2013-05-13 — End: 2013-05-13
  Administered 2013-05-13: 70 mL via INTRAVENOUS

## 2013-05-13 MED ORDER — ALBUTEROL SULFATE (5 MG/ML) 0.5% IN NEBU
2.5000 mg | INHALATION_SOLUTION | RESPIRATORY_TRACT | Status: DC
  Administered 2013-05-13: 2.5 mg via RESPIRATORY_TRACT
  Filled 2013-05-13: qty 0.5

## 2013-05-13 MED ORDER — IPRATROPIUM BROMIDE 0.02 % IN SOLN
0.5000 mg | RESPIRATORY_TRACT | Status: DC
  Administered 2013-05-13: 0.5 mg via RESPIRATORY_TRACT
  Filled 2013-05-13: qty 2.5

## 2013-05-13 NOTE — ED Notes (Signed)
Pt states that she has had a dry cough x 2 weeks and started having cp x 2 days ago.

## 2013-05-13 NOTE — ED Notes (Signed)
Patient transported to CT 

## 2013-05-13 NOTE — ED Notes (Signed)
RT called for breathing tx. 

## 2013-05-13 NOTE — ED Notes (Signed)
Pt has a ride home.  

## 2013-05-13 NOTE — ED Provider Notes (Signed)
Michelle Huffman S 8:00 PM patient discussed and signed out. Patient with history of cough for the past 2 weeks. Has been associated with fevers. Slightly thickened neck otherwise stable vital signs. Unremarkable chest x-ray. Labs including d-dimer pending.  D-dimer elevated. I discussed findings with patient and we will proceed to CT angiogram rule out PE. Patient is a very low risk for PE. Currently normal respirations and O2 sats.   CT negative for PE. No other concerning findings to explain continued cough. Will prescribe symptomatic treatment and outpatient followup with PCP.  Angus Seller, PA-C 05/13/13 2213

## 2013-05-13 NOTE — ED Provider Notes (Signed)
CSN: [id number]     Arrival date & time 05/13/13  1630 History  This chart was scribed for non-physician practitioner working with Suzi Roots, MD, by Ardelia Mems ED Scribe. This patient was seen in room WTR8/WTR8 and the patient's care was started at 7:30 PM.   Chief Complaint  Patient presents with  . Chest Pain  . Cough    The history is provided by the patient. No language interpreter was used.   HPI Comments: Michelle Huffman is a 47 y.o. female with a history of anemia and HTN who presents to the Emergency Department complaining of a persistent, non-productive cough onset 2 weeks ago, followed by gradual onset, gradually worsening, generalized chest pain onset 5 days ago. She is coughing forcefully during this visit. She also reports having  fever. Triage temperature is 99.3 F. She states that her chest pain is worsened with deep inspiration and is not brought on by exertion. She reports associated SOB which she states is not normal for her. She does not relate her symptoms to cold symptoms. She states that she has never been diagnosed with asthma, but states that she has a family history of asthma. She denies swelling or pain in her calves. She denies recent immobilization. She dneies personal or family history of clots. She states that she has not had any recent surgeries. She denies nasal congestion, sinus pain, sore throat or any other symptoms. She states that she is a former smoker and an occasional alcohol user.  PCP- Dr. Merri Brunette   Past Medical History  Diagnosis Date  . Blood transfusion   . Chest pain   . Anemia   . Thyroid disease     hypothyroid  . Hypertension    Past Surgical History  Procedure Laterality Date  . Knee surgery  2010    laproscopic right knee  . Abdominal hysterectomy  2009  . Cholecystectomy  07/21/11   History reviewed. No pertinent family history. History  Substance Use Topics  . Smoking status: Former Games developer  . Smokeless  tobacco: Never Used  . Alcohol Use: Yes   OB History   Grav Para Term Preterm Abortions TAB SAB Ect Mult Living                 Review of Systems A complete 10 system review of systems was obtained and all systems are negative except as noted in the HPI and PMH.   Allergies  Sulfa antibiotics  Home Medications   Current Outpatient Rx  Name  Route  Sig  Dispense  Refill  . hydrochlorothiazide (HYDRODIURIL) 25 MG tablet   Oral   Take 25 mg by mouth daily.          Triage Vitals: BP 131/87  Pulse 77  Temp(Src) 99.3 F (37.4 C) (Oral)  SpO2 100%  Physical Exam  Nursing note and vitals reviewed. Constitutional: She is oriented to person, place, and time. She appears well-developed and well-nourished.  HENT:  Head: Normocephalic and atraumatic.  Mouth/Throat: Oropharynx is clear and moist.  Eyes: Pupils are equal, round, and reactive to light.  Neck: Normal range of motion. Neck supple. No tracheal deviation present.  Cardiovascular: Normal rate, regular rhythm and normal heart sounds.   Pulmonary/Chest:  Dyspneic. RR 30 /min. Appears uncomfortable. Speaking in 3-4 word sentences.  Unable to lean back due to dyspnea  Abdominal: Soft. She exhibits no distension.  Musculoskeletal: Normal range of motion. She exhibits no tenderness.  Neurological: She is  alert and oriented to person, place, and time.  Skin: Skin is warm and dry. No rash noted.  Psychiatric: She has a normal mood and affect.    ED Course   Medications  oxyCODONE-acetaminophen (PERCOCET/ROXICET) 5-325 MG per tablet 2 tablet (2 tablets Oral Given 05/13/13 2002)  benzonatate (TESSALON) capsule 200 mg (200 mg Oral Given 05/13/13 2100)  iohexol (OMNIPAQUE) 350 MG/ML injection 100 mL (70 mLs Intravenous Contrast Given 05/13/13 2143)   Procedures (including critical care time)  DIAGNOSTIC STUDIES: Oxygen Saturation is 100% on RA, normal by my interpretation.    COORDINATION OF CARE: 7:41 PM- Pt advised of  plan to have a D-dimer test to ensure that she does not have a blood clot and pt agrees. 9:00 PM- Discussed plan to order a Cat scan with pt and pt agrees.  Labs Reviewed  D-DIMER, QUANTITATIVE - Abnormal; Notable for the following:    D-Dimer, Quant 0.69 (*)    All other components within normal limits  CBC - Abnormal; Notable for the following:    Hemoglobin 9.2 (*)    HCT 29.9 (*)    MCV 66.4 (*)    MCH 20.4 (*)    RDW 18.2 (*)    All other components within normal limits  BASIC METABOLIC PANEL   Dg Chest 2 View  05/13/2013   *RADIOLOGY REPORT*  Clinical Data: Chest pain and cough  CHEST - 2 VIEW  Comparison: May 17, 2011  Findings:  Lungs clear.  The heart size and pulmonary vascularity are normal.  No adenopathy.  There is degenerative change in the thoracic spine.  No pneumothorax.  IMPRESSION: No edema or consolidation.   Original Report Authenticated By: Bretta Bang, M.D.   Ct Angio Chest W/cm &/or Wo Cm  05/13/2013   *RADIOLOGY REPORT*  Clinical Data: Cough for 2-week, shortness of breath  CT ANGIOGRAPHY CHEST  Technique:  Multidetector CT imaging of the chest using the standard protocol during bolus administration of intravenous contrast. Multiplanar reconstructed images including MIPs were obtained and reviewed to evaluate the vascular anatomy.  Contrast: 70mL OMNIPAQUE IOHEXOL 350 MG/ML SOLN  Comparison: 05/13/2013  Findings: The study is of adequate technical quality for evaluation for pulmonary embolism up to and including the 3rd order pulmonary arteries. No focal filling defect is seen to suggest acute pulmonary embolism.  Trace superior pericardial recess fluid identified.  Heart size normal.  No lymphadenopathy.  Great vessels are normal in caliber.  The lungs are clear.  No acute osseous finding.  IMPRESSION: No acute cardiopulmonary process.  Specifically, no acute pulmonary embolism is identified.   Original Report Authenticated By: Christiana Pellant, M.D.   1. Cough      MDM  Patient with sob. Wells low risk and PERC negative, however, the patient appears dyspneic , rr in the 30s. Oxygen saturations in 100% on RA.  I have decided to get a d-dimer on the patient considering her dyspnea, discomfort, tachypnea,pleuritic type chest pain, and positional relief. i have given report to PA Dammen who will assume care of the patient .      I personally performed the services described in this documentation, which was scribed in my presence. The recorded information has been reviewed and is accurate.     Arthor Captain, PA-C 05/16/13 [phone]

## 2013-05-14 NOTE — ED Provider Notes (Signed)
Medical screening examination/treatment/procedure(s) were performed by non-physician practitioner and as supervising physician I was immediately available for consultation/collaboration.   Suzi Roots, MD 05/14/13 2136

## 2013-05-16 NOTE — ED Provider Notes (Signed)
Medical screening examination/treatment/procedure(s) were performed by non-physician practitioner and as supervising physician I was immediately available for consultation/collaboration.   Suzi Roots, MD 05/16/13 [phone]

## 2013-07-23 DIAGNOSIS — J9809 Other diseases of bronchus, not elsewhere classified: Secondary | ICD-10-CM | POA: Insufficient documentation

## 2013-10-17 DIAGNOSIS — R768 Other specified abnormal immunological findings in serum: Secondary | ICD-10-CM | POA: Insufficient documentation

## 2013-10-17 DIAGNOSIS — J45909 Unspecified asthma, uncomplicated: Secondary | ICD-10-CM | POA: Insufficient documentation

## 2013-10-17 DIAGNOSIS — R700 Elevated erythrocyte sedimentation rate: Secondary | ICD-10-CM | POA: Insufficient documentation

## 2013-10-29 DIAGNOSIS — M069 Rheumatoid arthritis, unspecified: Secondary | ICD-10-CM | POA: Insufficient documentation

## 2014-03-01 ENCOUNTER — Other Ambulatory Visit: Payer: Self-pay

## 2014-03-01 ENCOUNTER — Emergency Department (HOSPITAL_BASED_OUTPATIENT_CLINIC_OR_DEPARTMENT_OTHER)
Admission: EM | Admit: 2014-03-01 | Discharge: 2014-03-02 | Disposition: A | Discharge status: Home / Self Care | Payer: BC Managed Care – PPO | Attending: Emergency Medicine | Admitting: Emergency Medicine

## 2014-03-01 ENCOUNTER — Encounter (HOSPITAL_BASED_OUTPATIENT_CLINIC_OR_DEPARTMENT_OTHER): Payer: Self-pay | Admitting: Emergency Medicine

## 2014-03-01 ENCOUNTER — Emergency Department (HOSPITAL_BASED_OUTPATIENT_CLINIC_OR_DEPARTMENT_OTHER): Payer: BC Managed Care – PPO

## 2014-03-01 DIAGNOSIS — I10 Essential (primary) hypertension: Secondary | ICD-10-CM | POA: Insufficient documentation

## 2014-03-01 DIAGNOSIS — Z79899 Other long term (current) drug therapy: Secondary | ICD-10-CM | POA: Insufficient documentation

## 2014-03-01 DIAGNOSIS — Z8639 Personal history of other endocrine, nutritional and metabolic disease: Secondary | ICD-10-CM | POA: Insufficient documentation

## 2014-03-01 DIAGNOSIS — M25519 Pain in unspecified shoulder: Secondary | ICD-10-CM

## 2014-03-01 DIAGNOSIS — IMO0002 Reserved for concepts with insufficient information to code with codable children: Secondary | ICD-10-CM | POA: Insufficient documentation

## 2014-03-01 DIAGNOSIS — Z862 Personal history of diseases of the blood and blood-forming organs and certain disorders involving the immune mechanism: Secondary | ICD-10-CM | POA: Insufficient documentation

## 2014-03-01 DIAGNOSIS — Z9889 Other specified postprocedural states: Secondary | ICD-10-CM | POA: Insufficient documentation

## 2014-03-01 DIAGNOSIS — Z87891 Personal history of nicotine dependence: Secondary | ICD-10-CM | POA: Insufficient documentation

## 2014-03-01 HISTORY — DX: Hypothyroidism, unspecified: E03.9

## 2014-03-01 HISTORY — DX: Systemic involvement of connective tissue, unspecified: M35.9

## 2014-03-01 MED ORDER — HYDROCODONE-ACETAMINOPHEN 5-325 MG PO TABS: 2.0000 | ORAL_TABLET | ORAL | Status: DC | PRN

## 2014-03-01 MED ORDER — METHYLPREDNISOLONE (PAK) 4 MG PO TABS: ORAL_TABLET | ORAL | Status: DC

## 2014-03-01 MED ORDER — KETOROLAC TROMETHAMINE 60 MG/2ML IM SOLN
60.0000 mg | Freq: Once | INTRAMUSCULAR | Status: AC
  Administered 2014-03-01: 60 mg via INTRAMUSCULAR
  Filled 2014-03-01: qty 2

## 2014-03-01 NOTE — ED Provider Notes (Signed)
CSN: [id number]     Arrival date & time 03/01/14  2005 History  This chart was scribed for Michelle Essex, MD by Roe Coombs, ED Scribe. The patient was seen in room MH08/MH08. Patient's care was started at 8:42 PM.  Chief Complaint  Patient presents with  . Arm Pain    The history is provided by the patient. No language interpreter was used.    HPI Comments: Michelle Huffman is a 48 y.o. female who presents to the Emergency Department complaining of constant right shoulder pain that began 3 days ago. She denies obvious injury or trauma precipitating pain. Patient states that after pain began, she took ibuprofen at home with no relief. She was then evaluated at an urgent care center and had an x-ray that was negative for fracture. She was given a prescription for Flexeril which she has taken without improvement in pain. She denies associated numbness or weakness in the right upper extremity, denies right elbow pain, and denies right wrist or hand pain. Patient has no history of rotator cuff problems. She denies fever, chest pain, shortness of breath, abdominal pain, or any other symptoms at this time. She has no history of DM. Her medical history includes hypertension. Patient denies IV drug use.  PCP - Nena Polio   Past Medical History  Diagnosis Date  . Blood transfusion   . Chest pain   . Anemia   . Hypertension   . Autoimmune disease   . Hypothyroidism    Past Surgical History  Procedure Laterality Date  . Knee surgery  2010    laproscopic right knee  . Abdominal hysterectomy  2009  . Cholecystectomy  07/21/11   History reviewed. No pertinent family history. History  Substance Use Topics  . Smoking status: Former Research scientist (life sciences)  . Smokeless tobacco: Never Used  . Alcohol Use: Yes     Comment: socially   OB History   Grav Para Term Preterm Abortions TAB SAB Ect Mult Living                 Review of Systems A complete 10 system review of systems was obtained and all  systems are negative except as noted in the HPI and PMH.  Allergies  Sulfa antibiotics  Home Medications   Prior to Admission medications   Medication Sig Start Date End Date Taking? Authorizing Provider  hydrochlorothiazide (HYDRODIURIL) 25 MG tablet Take 25 mg by mouth daily.    Historical Provider, MD  HYDROcodone-acetaminophen (NORCO/VICODIN) 5-325 MG per tablet Take 2 tablets by mouth every 4 (four) hours as needed. 03/01/14   Michelle Essex, MD  methylPREDNIsolone (MEDROL DOSPACK) 4 MG tablet follow package directions 03/01/14   Michelle Essex, MD   Triage Vitals: BP 148/87  Pulse 81  Temp(Src) 98.9 F (37.2 C) (Oral)  Resp 18  Ht 5\' 4"  (1.626 m)  Wt 230 lb (104.327 kg)  BMI 39.46 kg/m2  SpO2 100% Physical Exam  Nursing note and vitals reviewed. Constitutional: She is oriented to person, place, and time. She appears well-developed and well-nourished.  Afebrile.  HENT:  Head: Normocephalic and atraumatic.  Eyes: Conjunctivae and EOM are normal.  Neck: Normal range of motion. Neck supple. No tracheal deviation present.  Cardiovascular: Normal rate, regular rhythm, normal heart sounds and intact distal pulses.  Exam reveals no gallop and no friction rub.   No murmur heard. Pulmonary/Chest: Effort normal and breath sounds normal. No respiratory distress. She has no wheezes. She has no rales.  Abdominal:  Soft. There is no tenderness.  Musculoskeletal:  Tender over the anterior joint line of the glenohumeral joint on the right. No erythema or effusion. Pain with abduction of the right shoulder. Able to abduct to 90 degrees. Pain with all directions of motion. Equal grip strength.   No midline spine tenderness.  Neurological: She is alert and oriented to person, place, and time.  Skin: Skin is warm and dry.  Psychiatric: She has a normal mood and affect. Her behavior is normal.    ED Course  Procedures (including critical care time) DIAGNOSTIC STUDIES: Oxygen Saturation  is 100% on room air, normal by my interpretation.    COORDINATION OF CARE: 8:52 PM- Patient informed of current plan for treatment and evaluation and agrees with plan at this time.   10:03 PM - Updated patient on imaging results. Will order sling. Advised follow up with Sports Medicine or PCP. Patient verbalizes understanding and agrees.     Imaging Review Dg Cervical Spine Complete  03/01/2014   CLINICAL DATA:  Right shoulder pain for 4 days with no known injury  EXAM: CERVICAL SPINE  4+ VIEWS  COMPARISON:  None.  FINDINGS: Normal alignment with straightening of the cervical spine. Moderate degenerative disc disease at C3-4. Severe C4-5, C5-6 degenerative disc disease. Moderate C6-7 degenerative disc disease.  IMPRESSION: Degenerative changes.  No acute findings.   Electronically Signed   By: Skipper Cliche M.D.   On: 03/01/2014 21:31   Dg Shoulder Right  03/01/2014   CLINICAL DATA:  48 year old female with upper extremity pain. Initial encounter.  EXAM: RIGHT SHOULDER - 2+ VIEW  COMPARISON:  None.  FINDINGS: Bone mineralization is within normal limits. No glenohumeral joint dislocation. Proximal right humerus intact. Visible right clavicle and scapula intact. Scapular-Y views are suboptimal, in part related to large body habitus. Visible right ribs and lung parenchyma within normal limits.  IMPRESSION: No acute osseous abnormality identified at the right shoulder.   Electronically Signed   By: Lars Pinks M.D.   On: 03/01/2014 21:32     EKG Interpretation None      MDM   Final diagnoses:  Shoulder pain  4 day history of R shoulder pain without trauma.  Given muscle relaxers at [facility].  No chest pain or SOB.  No focal weakness, numbness, tingling. No pain in other joints. No fever.  Xrays are negative. No evidence of cervical radiculopathy.  Pulses intact. Suspect rotator cuff or impingement syndrome. NO effusion or erythema.   Low suspicion for septic joint.  Sling, pain control,  steroids, follow up with sports med.     I personally performed the services described in this documentation, which was scribed in my presence. The recorded information has been reviewed and is accurate.    Michelle Essex, MD 03/01/14 [phone]

## 2014-03-01 NOTE — Discharge Instructions (Signed)
Arthralgia Follow up with Dr. Barbaraann Barthel. Take the medications as prescribed. Return to the ED if you develop new or worsening symptoms. Your caregiver has diagnosed you as suffering from an arthralgia. Arthralgia means there is pain in a joint. This can come from many reasons including:  Bruising the joint which causes soreness (inflammation) in the joint.  Wear and tear on the joints which occur as we grow older (osteoarthritis).  Overusing the joint.  Various forms of arthritis.  Infections of the joint. Regardless of the cause of pain in your joint, most of these different pains respond to anti-inflammatory drugs and rest. The exception to this is when a joint is infected, and these cases are treated with antibiotics, if it is a bacterial infection. HOME CARE INSTRUCTIONS   Rest the injured area for as long as directed by your caregiver. Then slowly start using the joint as directed by your caregiver and as the pain allows. Crutches as directed may be useful if the ankles, knees or hips are involved. If the knee was splinted or casted, continue use and care as directed. If an stretchy or elastic wrapping bandage has been applied today, it should be removed and re-applied every 3 to 4 hours. It should not be applied tightly, but firmly enough to keep swelling down. Watch toes and feet for swelling, bluish discoloration, coldness, numbness or excessive pain. If any of these problems (symptoms) occur, remove the ace bandage and re-apply more loosely. If these symptoms persist, contact your caregiver or return to this location.  For the first 24 hours, keep the injured extremity elevated on pillows while lying down.  Apply ice for 15-20 minutes to the sore joint every couple hours while awake for the first half day. Then 03-04 times per day for the first 48 hours. Put the ice in a plastic bag and place a towel between the bag of ice and your skin.  Wear any splinting, casting, elastic bandage  applications, or slings as instructed.  Only take over-the-counter or prescription medicines for pain, discomfort, or fever as directed by your caregiver. Do not use aspirin immediately after the injury unless instructed by your physician. Aspirin can cause increased bleeding and bruising of the tissues.  If you were given crutches, continue to use them as instructed and do not resume weight bearing on the sore joint until instructed. Persistent pain and inability to use the sore joint as directed for more than 2 to 3 days are warning signs indicating that you should see a caregiver for a follow-up visit as soon as possible. Initially, a hairline fracture (break in bone) may not be evident on X-rays. Persistent pain and swelling indicate that further evaluation, non-weight bearing or use of the joint (use of crutches or slings as instructed), or further X-rays are indicated. X-rays may sometimes not show a small fracture until a week or 10 days later. Make a follow-up appointment with your own caregiver or one to whom we have referred you. A radiologist (specialist in reading X-rays) may read your X-rays. Make sure you know how you are to obtain your X-ray results. Do not assume everything is normal if you do not hear from [place]. SEEK MEDICAL CARE IF: Bruising, swelling, or pain increases. SEEK IMMEDIATE MEDICAL CARE IF:   Your fingers or toes are numb or blue.  The pain is not responding to medications and continues to stay the same or get worse.  The pain in your joint becomes severe.  You develop  a fever over 102 F (38.9 C).  It becomes impossible to move or use the joint. MAKE SURE YOU:   Understand these instructions.  Will watch your condition.  Will get help right away if you are not doing well or get worse. Document Released: 09/19/2005 Document Revised: 12/12/2011 Document Reviewed: 05/07/2008 [facility] Patient Information 2014 [place].

## 2014-03-01 NOTE — ED Notes (Signed)
Pt reports (R) shoulder pain x 4 days.  Seen at [facility], given rx for muscle relaxers and ibuprofen-reports no change in pain.  Denies injruy

## 2014-03-04 ENCOUNTER — Encounter: Payer: Self-pay | Admitting: Family Medicine

## 2014-03-04 ENCOUNTER — Ambulatory Visit (INDEPENDENT_AMBULATORY_CARE_PROVIDER_SITE_OTHER): Payer: BC Managed Care – PPO | Admitting: Family Medicine

## 2014-03-04 VITALS — BP 127/90 | HR 67 | Ht 64.0 in | Wt 227.0 lb

## 2014-03-04 DIAGNOSIS — M25511 Pain in right shoulder: Secondary | ICD-10-CM

## 2014-03-04 DIAGNOSIS — M25519 Pain in unspecified shoulder: Secondary | ICD-10-CM

## 2014-03-04 MED ORDER — HYDROCODONE-ACETAMINOPHEN 5-325 MG PO TABS: 1.0000 | ORAL_TABLET | Freq: Four times a day (QID) | ORAL | Status: DC | PRN

## 2014-03-04 MED ORDER — METHYLPREDNISOLONE ACETATE 40 MG/ML IJ SUSP
40.0000 mg | Freq: Once | INTRAMUSCULAR | Status: AC
  Administered 2014-03-04: 40 mg via INTRA_ARTICULAR

## 2014-03-04 NOTE — Patient Instructions (Signed)
You have subacromial bursitis. Try to avoid painful activities (overhead activities, lifting with extended arm) as much as possible. Finish the prednisone then the day afte rthis you can take aleve 2 tabs twice a day with food OR ibuprofen 3 tabs three times a day with food for pain and inflammation. Norco as needed for severe pain. Subacromial injection may be beneficial to help with pain and to decrease inflammation - you were given this today. In a few days start the arm circles, swings, table slides I showed you - 3 sets of 10 once a day. After a couple weeks can add the theraband strengthening exercises. If not improving at follow-up we will consider further imaging, physical therapy and/or nitro patches. Follow up with me in 1 month for reevaluation.

## 2014-03-05 ENCOUNTER — Ambulatory Visit: Payer: BC Managed Care – PPO | Admitting: Family Medicine

## 2014-03-06 ENCOUNTER — Encounter: Payer: Self-pay | Admitting: Family Medicine

## 2014-03-06 DIAGNOSIS — M25511 Pain in right shoulder: Secondary | ICD-10-CM | POA: Insufficient documentation

## 2014-03-06 NOTE — Assessment & Plan Note (Signed)
consistent with subacromial bursitis, possible developing frozen shoulder.  Continue prednisone and transition to nsaids.  Norco as needed severe pain.  Injection given today.  Start theraband HEP in a couple weeks - codman exercises now.  If not improving at follow-up we will consider further imaging, physical therapy and/or nitro patches.  Follow up with me in 1 month for reevaluation.  After informed written consent, patient was seated on exam table. Right shoulder was prepped with alcohol swab and utilizing posterior approach, patient's right subacromial space was injected with 3:1 marcaine: depomedrol. Patient tolerated the procedure well without immediate complications.

## 2014-03-06 NOTE — Progress Notes (Signed)
Patient ID: Michelle Huffman, female   DOB: [birth year], 48 y.o.   MRN: [id number]  PCP: Nena Polio, NP  Subjective:   HPI: Patient is a 48 y.o. female here for right shoulder pain.  Patient reports she woke up with pain about 4 days ago. No known injury. Pain felt within shoulder. Went to urgent care - given ibuprofen and muscle relaxant without much benefit. Went to ED and started on norco, prednisone - some improvement with this. Found to have severe DDD of cervical spine, normal shoulder x-rays. No prior issues. No numbness/tingling. No bowel/bladder dysfunction.  Past Medical History  Diagnosis Date  . Blood transfusion   . Chest pain   . Anemia   . Hypertension   . Autoimmune disease   . Hypothyroidism   . Lupus   . Arthritis     Current Outpatient Prescriptions on File Prior to Visit  Medication Sig Dispense Refill  . hydrochlorothiazide (HYDRODIURIL) 25 MG tablet Take 25 mg by mouth daily.      . methylPREDNIsolone (MEDROL DOSPACK) 4 MG tablet follow package directions  21 tablet  0   No current facility-administered medications on file prior to visit.    Past Surgical History  Procedure Laterality Date  . Knee surgery  2010    laproscopic right knee  . Abdominal hysterectomy  2009  . Cholecystectomy  07/21/11    Allergies  Allergen Reactions  . Sulfa Antibiotics Shortness Of Breath and Swelling    History   Social History  . Marital Status: Single    Spouse Name: N/A    Number of Children: N/A  . Years of Education: N/A   Occupational History  . Not on file.   Social History Main Topics  . Smoking status: Former Research scientist (life sciences)  . Smokeless tobacco: Never Used  . Alcohol Use: Yes     Comment: socially  . Drug Use: No  . Sexual Activity: Not on file   Other Topics Concern  . Not on file   Social History Narrative  . No narrative on file    History reviewed. No pertinent family history.  BP 127/90  Pulse 67  Ht 5\' 4"  (1.626 m)  Wt 227  lb (102.967 kg)  BMI 38.95 kg/m2  Review of Systems: See HPI above.    Objective:  Physical Exam:  Gen: NAD  Neck: No gross deformity, swelling, bruising. No midline or paraspinal TTP. FROM neck without pain . BUE strength 5/5.   Sensation intact to light touch.   Negative spurlings. NV intact distal BUEs.  R shoulder: No swelling, ecchymoses.  No gross deformity. Diffuse TTP. ROM limited - 30 degrees ER, 40 degrees flexion and abduction. Positive Hawkins, Neers. Negative Yergasons. Strength 5/5 with resisted internal/external rotation.  Unable to position for empty can NV intact distally.  MSK u/s R shoulder:  Infraspinatus, subscapularis, supraspinatus intact on long and trans views.  Difficulty fully visualizing supraspinatus due to pain.  Biceps tendon also intact.    Assessment & Plan:  1. Right shoulder pain - consistent with subacromial bursitis, possible developing frozen shoulder.  Continue prednisone and transition to nsaids.  Norco as needed severe pain.  Injection given today.  Start theraband HEP in a couple weeks - codman exercises now.  If not improving at follow-up we will consider further imaging, physical therapy and/or nitro patches.  Follow up with me in 1 month for reevaluation.  After informed written consent, patient was seated on exam table. Right shoulder was prepped  with alcohol swab and utilizing posterior approach, patient's right subacromial space was injected with 3:1 marcaine: depomedrol. Patient tolerated the procedure well without immediate complications.

## 2014-03-07 ENCOUNTER — Telehealth: Payer: Self-pay | Admitting: Family Medicine

## 2014-03-10 ENCOUNTER — Other Ambulatory Visit: Payer: Self-pay | Admitting: *Deleted

## 2014-03-10 MED ORDER — MELOXICAM 15 MG PO TABS
15.0000 mg | ORAL_TABLET | Freq: Every day | ORAL | Status: DC
Start: 2014-03-10 — End: 2014-04-07

## 2014-03-10 NOTE — Telephone Encounter (Signed)
Ok to call in meloxicam 15 mg daily with food #30 with 1 refill to her pharmacy.

## 2014-03-27 ENCOUNTER — Encounter (INDEPENDENT_AMBULATORY_CARE_PROVIDER_SITE_OTHER): Payer: Self-pay | Admitting: General Surgery

## 2014-03-27 ENCOUNTER — Ambulatory Visit (INDEPENDENT_AMBULATORY_CARE_PROVIDER_SITE_OTHER): Payer: BC Managed Care – PPO | Admitting: General Surgery

## 2014-03-27 DIAGNOSIS — I10 Essential (primary) hypertension: Secondary | ICD-10-CM

## 2014-03-27 DIAGNOSIS — J45909 Unspecified asthma, uncomplicated: Secondary | ICD-10-CM

## 2014-03-27 DIAGNOSIS — Z8639 Personal history of other endocrine, nutritional and metabolic disease: Secondary | ICD-10-CM

## 2014-03-27 DIAGNOSIS — Z862 Personal history of diseases of the blood and blood-forming organs and certain disorders involving the immune mechanism: Secondary | ICD-10-CM

## 2014-03-27 NOTE — Progress Notes (Signed)
Subjective:   morbid obesity, joint pain   Patient ID: Michelle Huffman, female   DOB: Sep 28, 1966, 48 y.o.   MRN: [id number]  HPI Patient is a pleasant 48 year old female referred by Dr. Nena Polio for consideration for surgical treatment for morbid obesity. The patient states that she has struggled with her weight since early adulthood. She has been through innumerable efforts at nonsurgical weight loss both physician supervised and unsupervised. She is able to lose up to 10 or 15 pounds at a time but then experiences weight regained. She has her concern about her long-term health going forward at her current weight. She has significant knee pain status post arthroscopy and feels that her awake significantly contributes to this. She has been diagnosed with mild hypertension.She has increasing difficulty with routine daily activities. She is very concerned about her long-term health going forward and her current weight. She has researched her options extensively over a couple of years. She has been to our information seminar and others in the area. She has talked with patients who have had surgical treatment for obesity. She currently is interested in sleeve gastrectomy.  Past Medical History  Diagnosis Date  . Blood transfusion   . Chest pain   . Anemia   . Hypertension   . Autoimmune disease   . Hypothyroidism   . Lupus   . Arthritis   . Asthma    Past Surgical History  Procedure Laterality Date  . Knee surgery  2010    laproscopic right knee  . Abdominal hysterectomy  2009  . Cholecystectomy  07/21/11   Current Outpatient Prescriptions  Medication Sig Dispense Refill  . traMADol (ULTRAM) 50 MG tablet Take 50 mg by mouth.      . cyclobenzaprine (FLEXERIL) 10 MG tablet       . hydrochlorothiazide (HYDRODIURIL) 25 MG tablet Take 25 mg by mouth daily.      [name] HYDROcodone-acetaminophen (NORCO/VICODIN) 5-325 MG per tablet Take 1 tablet by mouth every 6 (six) hours as needed.  60 tablet   0  . hydroxychloroquine (PLAQUENIL) 200 MG tablet       . ibuprofen (ADVIL,MOTRIN) 800 MG tablet Take 800 mg by mouth.      . meloxicam (MOBIC) 15 MG tablet Take 1 tablet (15 mg total) by mouth daily.  30 tablet  1  . methylPREDNIsolone (MEDROL DOSPACK) 4 MG tablet follow package directions  21 tablet  0   No current facility-administered medications for this visit.   Allergies  Allergen Reactions  . Sulfa Antibiotics Shortness Of Breath and Swelling     Review of Systems  Constitutional: Positive for fatigue. Negative for fever and chills.  Respiratory: Positive for wheezing (occasional and mild controlled with inhalers).   Cardiovascular: Negative.   Gastrointestinal: Negative.        No symptoms of reflux or heartburn  Genitourinary: Negative.   Musculoskeletal: Positive for arthralgias.       Objective:   Physical Exam BP 118/74  Pulse 71  Temp(Src) 97.6 F (36.4 C) (Temporal)  Resp 16  Ht 5\' 4"  (1.626 m)  Wt 231 lb (104.781 kg)  BMI 39.63 kg/m2 General: Alert, obese African American female, in no distress Skin: Warm and dry without rash or infection. HEENT: No palpable masses or thyromegaly. Sclera nonicteric. Pupils equal round and reactive. Oropharynx clear. Lymph nodes: No cervical, supraclavicular, or inguinal nodes palpable. Lungs: Breath sounds clear and equal without increased work of breathing Cardiovascular: Regular rate and rhythm without murmur.  No JVD or edema. Peripheral pulses intact. Abdomen: Nondistended. Soft and nontender. No masses palpable. No organomegaly. No palpable hernias. Extremities: No edema or joint swelling or deformity. No chronic venous stasis changes. Neurologic: Alert and fully oriented. Gait normal.    Assessment:     Patient with progressive morbid obesity unresponsive to multiple efforts at medical management who presents with a BMI of 39 and comorbidities of chronic joint pain and hypertension.. I believe there would be very  significant medical benefit from surgical weight loss. After our discussion of surgical options currently available the patient has decided to proceed with sleeve gastrectomy as she feels gastric bypass is too complex and extensive and LAP-BAND potentially not as effective and requiring more postoperative visits.. We have discussed the nature of the problem and the risks of remaining morbidly obese. We discussed laparoscopic sleeve gastrectomy in detail including the nature of the procedure, expected hospitalization and recovery, and major risks of anesthetic complications, bleeding, blood clots and pulmonary emboli, leakage and infection and long-term risks of stricture,  nutritional deficiencies, persistent nausea, and failure to lose weight or weight regain. We discussed these problems could lead to death. The patient was given a complete consent form to review and all questions were answered. We will go ahead with preoperative including psychological and nutrition evaluations,upper GI series, and routine lab and x-rays. I will see the patient back following these studies.    Plan:     Begin preoperative workup for laparoscopic sleeve gastrectomy as above

## 2014-04-01 LAB — URINALYSIS
GLUCOSE, UA: NEGATIVE mg/dL
LEUKOCYTES UA: NEGATIVE
Nitrite: NEGATIVE
PH: 6 (ref 5.0–8.0)
Protein, ur: 100 mg/dL — AB
Specific Gravity, Urine: >1.03 — ABNORMAL HIGH (ref 1.005–1.030)
Urobilinogen, UA: 0.2 mg/dL (ref 0.0–1.0)

## 2014-04-01 LAB — COMPREHENSIVE METABOLIC PANEL
ALBUMIN: 3.5 g/dL (ref 3.5–5.2)
ALT: 21 U/L (ref 0–35)
AST: 28 U/L (ref 0–37)
Alkaline Phosphatase: 80 U/L (ref 39–117)
BUN: 10 mg/dL (ref 6–23)
CALCIUM: 8.6 mg/dL (ref 8.4–10.5)
CO2: 22 meq/L (ref 19–32)
Chloride: 107 mEq/L (ref 96–112)
Creat: 0.63 mg/dL (ref 0.50–1.10)
GLUCOSE: 100 mg/dL — AB (ref 70–99)
POTASSIUM: 3.4 meq/L — AB (ref 3.5–5.3)
SODIUM: 142 meq/L (ref 135–145)
TOTAL PROTEIN: 7.3 g/dL (ref 6.0–8.3)
Total Bilirubin: 0.3 mg/dL (ref 0.2–1.2)

## 2014-04-01 LAB — CBC WITH DIFFERENTIAL/PLATELET
Basophils Absolute: 0 10*3/uL (ref 0.0–0.1)
Basophils Relative: 0 % (ref 0–1)
EOS ABS: 0.3 10*3/uL (ref 0.0–0.7)
Eosinophils Relative: 5 % (ref 0–5)
HCT: 31.2 % — ABNORMAL LOW (ref 36.0–46.0)
HEMOGLOBIN: 9.6 g/dL — AB (ref 12.0–15.0)
LYMPHS ABS: 1.1 10*3/uL (ref 0.7–4.0)
Lymphocytes Relative: 22 % (ref 12–46)
MCH: 20.6 pg — AB (ref 26.0–34.0)
MCHC: 30.8 g/dL (ref 30.0–36.0)
MCV: 67.1 fL — ABNORMAL LOW (ref 78.0–100.0)
MONOS PCT: 6 % (ref 3–12)
Monocytes Absolute: 0.3 10*3/uL (ref 0.1–1.0)
NEUTROS ABS: 3.4 10*3/uL (ref 1.7–7.7)
NEUTROS PCT: 67 % (ref 43–77)
Platelets: 272 10*3/uL (ref 150–400)
RBC: 4.65 MIL/uL (ref 3.87–5.11)
RDW: 19.3 % — ABNORMAL HIGH (ref 11.5–15.5)
WBC: 5.1 10*3/uL (ref 4.0–10.5)

## 2014-04-01 LAB — T4: T4 TOTAL: 10.3 ug/dL (ref 5.0–12.5)

## 2014-04-01 LAB — LIPID PANEL
Cholesterol: 199 mg/dL (ref 0–200)
HDL: 43 mg/dL (ref >39)
LDL Cholesterol: 121 mg/dL — ABNORMAL HIGH (ref 0–99)
Total CHOL/HDL Ratio: 4.6 Ratio
Triglycerides: 175 mg/dL — ABNORMAL HIGH (ref <150)
VLDL: 35 mg/dL (ref 0–40)

## 2014-04-01 LAB — TSH: TSH: 1.59 u[IU]/mL (ref 0.350–4.500)

## 2014-04-02 ENCOUNTER — Ambulatory Visit (INDEPENDENT_AMBULATORY_CARE_PROVIDER_SITE_OTHER): Payer: BC Managed Care – PPO | Admitting: Family Medicine

## 2014-04-02 ENCOUNTER — Encounter: Payer: Self-pay | Admitting: Family Medicine

## 2014-04-02 VITALS — BP 126/90 | HR 76 | Ht 64.0 in | Wt 230.0 lb

## 2014-04-02 DIAGNOSIS — M25511 Pain in right shoulder: Secondary | ICD-10-CM

## 2014-04-02 DIAGNOSIS — M25519 Pain in unspecified shoulder: Secondary | ICD-10-CM

## 2014-04-02 NOTE — Patient Instructions (Signed)
Continue home exercises most days of the week for next 6 weeks. Follow up with me as needed.

## 2014-04-07 ENCOUNTER — Encounter: Payer: Self-pay | Admitting: Family Medicine

## 2014-04-07 NOTE — Progress Notes (Signed)
Patient ID: Michelle Huffman, female   DOB: 1966-03-13, 48 y.o.   MRN: [id number]  PCP: Nena Polio, NP  Subjective:   HPI: Patient is a 48 y.o. female here for right shoulder pain.  6/2: Patient reports she woke up with pain about 4 days ago. No known injury. Pain felt within shoulder. Went to urgent care - given ibuprofen and muscle relaxant without much benefit. Went to ED and started on norco, prednisone - some improvement with this. Found to have severe DDD of cervical spine, normal shoulder x-rays. No prior issues. No numbness/tingling. No bowel/bladder dysfunction.  7/1: Patient reports she feels better. Injection helped quite a bit. Doing home exercise program. Feels over 70% improved. No other complaints.  Past Medical History  Diagnosis Date  . Blood transfusion   . Chest pain   . Anemia   . Hypertension   . Autoimmune disease   . Hypothyroidism   . Lupus   . Arthritis   . Asthma     Current Outpatient Prescriptions on File Prior to Visit  Medication Sig Dispense Refill  . hydrochlorothiazide (HYDRODIURIL) 25 MG tablet Take 25 mg by mouth daily.      . hydroxychloroquine (PLAQUENIL) 200 MG tablet       . ibuprofen (ADVIL,MOTRIN) 800 MG tablet Take 800 mg by mouth.      . traMADol (ULTRAM) 50 MG tablet Take 50 mg by mouth.       No current facility-administered medications on file prior to visit.    Past Surgical History  Procedure Laterality Date  . Knee surgery  2010    laproscopic right knee  . Abdominal hysterectomy  2009  . Cholecystectomy  07/21/11    Allergies  Allergen Reactions  . Sulfa Antibiotics Shortness Of Breath and Swelling    History   Social History  . Marital Status: Single    Spouse Name: N/A    Number of Children: N/A  . Years of Education: N/A   Occupational History  . Not on file.   Social History Main Topics  . Smoking status: Former Research scientist (life sciences)  . Smokeless tobacco: Never Used  . Alcohol Use: Yes     Comment:  socially  . Drug Use: No  . Sexual Activity: Not on file   Other Topics Concern  . Not on file   Social History Narrative  . No narrative on file    No family history on file.  BP 126/90  Pulse 76  Ht 5\' 4"  (1.626 m)  Wt 230 lb (104.327 kg)  BMI 39.46 kg/m2  Review of Systems: See HPI above.    Objective:  Physical Exam:  Gen: NAD  R shoulder: No swelling, ecchymoses.  No gross deformity. Mild Diffuse TTP. FROM now - much improved.  Painful arc, mild. Mild positive Hawkins, Neers. Negative Yergasons. Strength 5/5 with resisted internal/external rotation, empty can. NV intact distally.    Assessment & Plan:  1. Right shoulder pain - consistent with subacromial bursitis.  Much improved following injection and HEP.  Advised to continue with HEP for another 6 weeks.  Call with any concerns otherwise f/u prn.

## 2014-04-07 NOTE — Assessment & Plan Note (Signed)
consistent with subacromial bursitis.  Much improved following injection and HEP.  Advised to continue with HEP for another 6 weeks.  Call with any concerns otherwise f/u prn.

## 2014-04-12 ENCOUNTER — Ambulatory Visit: Payer: BC Managed Care – PPO | Admitting: Dietician

## 2014-05-01 ENCOUNTER — Other Ambulatory Visit: Payer: Self-pay

## 2014-05-01 ENCOUNTER — Ambulatory Visit: Payer: BC Managed Care – PPO | Admitting: Dietician

## 2014-05-01 ENCOUNTER — Ambulatory Visit (HOSPITAL_COMMUNITY)
Admission: RE | Admit: 2014-05-01 | Discharge: 2014-05-01 | Disposition: A | Discharge status: Home / Self Care | Payer: BC Managed Care – PPO | Source: Ambulatory Visit | Attending: General Surgery | Admitting: General Surgery

## 2014-05-01 DIAGNOSIS — I10 Essential (primary) hypertension: Secondary | ICD-10-CM

## 2014-05-01 DIAGNOSIS — J45909 Unspecified asthma, uncomplicated: Secondary | ICD-10-CM | POA: Insufficient documentation

## 2014-05-01 DIAGNOSIS — M47814 Spondylosis without myelopathy or radiculopathy, thoracic region: Secondary | ICD-10-CM | POA: Insufficient documentation

## 2014-05-01 DIAGNOSIS — R079 Chest pain, unspecified: Secondary | ICD-10-CM | POA: Insufficient documentation

## 2014-05-01 DIAGNOSIS — Z6839 Body mass index (BMI) 39.0-39.9, adult: Secondary | ICD-10-CM | POA: Insufficient documentation

## 2014-05-01 DIAGNOSIS — Z8639 Personal history of other endocrine, nutritional and metabolic disease: Secondary | ICD-10-CM | POA: Insufficient documentation

## 2014-05-01 DIAGNOSIS — Z862 Personal history of diseases of the blood and blood-forming organs and certain disorders involving the immune mechanism: Secondary | ICD-10-CM | POA: Insufficient documentation

## 2014-05-01 DIAGNOSIS — D649 Anemia, unspecified: Secondary | ICD-10-CM | POA: Insufficient documentation

## 2014-05-01 DIAGNOSIS — Z9089 Acquired absence of other organs: Secondary | ICD-10-CM | POA: Insufficient documentation

## 2014-05-01 DIAGNOSIS — M329 Systemic lupus erythematosus, unspecified: Secondary | ICD-10-CM | POA: Insufficient documentation

## 2014-05-08 ENCOUNTER — Encounter: Payer: Self-pay | Admitting: Dietician

## 2014-05-08 ENCOUNTER — Encounter: Payer: BC Managed Care – PPO | Attending: General Surgery | Admitting: Dietician

## 2014-05-08 DIAGNOSIS — Z713 Dietary counseling and surveillance: Secondary | ICD-10-CM | POA: Insufficient documentation

## 2014-05-08 DIAGNOSIS — Z6841 Body Mass Index (BMI) 40.0 and over, adult: Secondary | ICD-10-CM | POA: Insufficient documentation

## 2014-05-08 NOTE — Progress Notes (Signed)
  Pre-Op Assessment Visit:  Pre-Operative Gastric sleeve Surgery  Medical Nutrition Therapy:  Appt start time: 0830   End time:  0915.  Patient was seen on 05/08/2014 for Pre-Operative Gastric sleeve Nutrition Assessment. Assessment and letter of approval faxed to [facility] Surgery Bariatric Surgery Program coordinator on 05/08/2014.   Preferred Learning Style:   No preference indicated   Learning Readiness:   Ready   Handouts given during visit include:  Pre-Op Goals Bariatric Surgery Protein Shakes Pre op diet (patient requested)  Teaching Method Utilized:  Visual Auditory  Barriers to learning/adherence to lifestyle change: none  Demonstrated degree of understanding via:  Teach Back   Patient to call the [facility] to enroll in Pre-Op and Post-Op Nutrition Education when surgery date is scheduled.

## 2014-07-28 ENCOUNTER — Encounter: Payer: BC Managed Care – PPO | Attending: General Surgery

## 2014-07-28 DIAGNOSIS — Z713 Dietary counseling and surveillance: Secondary | ICD-10-CM | POA: Diagnosis not present

## 2014-07-28 DIAGNOSIS — Z6837 Body mass index (BMI) 37.0-37.9, adult: Secondary | ICD-10-CM | POA: Insufficient documentation

## 2014-07-29 NOTE — Progress Notes (Signed)
  Pre-Operative Nutrition Class:  Appt start time: 3643   End time:  1830.  Patient was seen on 07/28/14 for Pre-Operative Bariatric Surgery Education at the [facility].   Surgery date: 08/19/2014 Surgery type: Gastric sleeve Start weight at [facility]: 234.5 lbs on 05/08/14 Weight today: 239.5 lbs  TANITA  BODY COMP RESULTS  07/29/14   BMI (kg/m^2) 41.1   Fat Mass (lbs) 121   Fat Free Mass (lbs) 118.5   Total Body Water (lbs) 87    Samples given per MNT protocol. Patient educated on appropriate usage: Unjury protein powder (chocolate) - qty 1 Lot #: [id number] Exp: 07/2015  Premier protein shake (strawberry) - qty 1 Lot #: [id number] Exp: 02/2015  Celebrate Vitamins Multivitamin (grape) - qty 1 Lot #: [id number] Exp: 02/2015  PB2 - qty 1 Lot #: [id number]  Exp: 05/2015  The following the learning objectives were met by the patient during this course:  Identify Pre-Op Dietary Goals and will begin 2 weeks pre-operatively  Identify appropriate sources of fluids and proteins   State protein recommendations and appropriate sources pre and post-operatively  Identify Post-Operative Dietary Goals and will follow for 2 weeks post-operatively  Identify appropriate multivitamin and calcium sources  Describe the need for physical activity post-operatively and will follow MD recommendations  State when to call healthcare provider regarding medication questions or post-operative complications  Handouts given during class include:  Pre-Op Bariatric Surgery Diet Handout  Protein Shake Handout  Post-Op Bariatric Surgery Nutrition Handout  BELT Program Information Flyer  Support Group Information Flyer  [facility] Bariatric Supplements Price List  Follow-Up Plan: Patient will follow-up at [facility] 2 weeks post operatively for diet advancement per MD.

## 2014-08-08 ENCOUNTER — Other Ambulatory Visit (INDEPENDENT_AMBULATORY_CARE_PROVIDER_SITE_OTHER): Payer: Self-pay | Admitting: General Surgery

## 2014-08-13 ENCOUNTER — Telehealth (INDEPENDENT_AMBULATORY_CARE_PROVIDER_SITE_OTHER): Payer: Self-pay

## 2014-08-13 NOTE — Telephone Encounter (Signed)
[facility] called and stated that they have been trying to get in contact with pt for sx and they have not contacted them at this time. Pt is scheduled for sx on 11/17 Michelle Huffman wanted to make Dr Excell Seltzer aware. Informed her that I would send Dr Excell Seltzer a message. She verbalized understanding

## 2014-08-15 NOTE — Progress Notes (Signed)
Have attempted to contact patient x 4 from 08/06/14-08/15/14 and left messages with no call back

## 2014-08-18 ENCOUNTER — Encounter (HOSPITAL_COMMUNITY)
Admission: RE | Admit: 2014-08-18 | Discharge: 2014-08-18 | Disposition: A | Discharge status: Home / Self Care | Payer: BC Managed Care – PPO | Source: Ambulatory Visit | Attending: General Surgery | Admitting: General Surgery

## 2014-08-18 ENCOUNTER — Encounter (HOSPITAL_COMMUNITY): Payer: Self-pay

## 2014-08-18 HISTORY — DX: Adverse effect of unspecified anesthetic, initial encounter: T41.45XA

## 2014-08-18 HISTORY — DX: Other specified postprocedural states: Z98.890

## 2014-08-18 HISTORY — DX: Nausea with vomiting, unspecified: R11.2

## 2014-08-18 HISTORY — DX: Other complications of anesthesia, initial encounter: T88.59XA

## 2014-08-18 LAB — COMPREHENSIVE METABOLIC PANEL
ALK PHOS: 70 U/L (ref 39–117)
ALT: 37 U/L — ABNORMAL HIGH (ref 0–35)
ANION GAP: 15 (ref 5–15)
AST: 40 U/L — ABNORMAL HIGH (ref 0–37)
Albumin: 3.5 g/dL (ref 3.5–5.2)
BUN: 17 mg/dL (ref 6–23)
CALCIUM: 9.4 mg/dL (ref 8.4–10.5)
CO2: 22 mEq/L (ref 19–32)
Chloride: 105 mEq/L (ref 96–112)
Creatinine, Ser: 0.85 mg/dL (ref 0.50–1.10)
GFR calc non Af Amer: 80 mL/min — ABNORMAL LOW (ref >90)
GLUCOSE: 93 mg/dL (ref 70–99)
Potassium: 4.3 mEq/L (ref 3.7–5.3)
Sodium: 142 mEq/L (ref 137–147)
Total Bilirubin: <0.2 mg/dL — ABNORMAL LOW (ref 0.3–1.2)
Total Protein: 8.4 g/dL — ABNORMAL HIGH (ref 6.0–8.3)

## 2014-08-18 LAB — CBC WITH DIFFERENTIAL/PLATELET
BASOS PCT: 0 % (ref 0–1)
Basophils Absolute: 0 10*3/uL (ref 0.0–0.1)
Eosinophils Absolute: 0.2 10*3/uL (ref 0.0–0.7)
Eosinophils Relative: 5 % (ref 0–5)
HCT: 30.7 % — ABNORMAL LOW (ref 36.0–46.0)
HEMOGLOBIN: 9.6 g/dL — AB (ref 12.0–15.0)
LYMPHS ABS: 1.4 10*3/uL (ref 0.7–4.0)
Lymphocytes Relative: 30 % (ref 12–46)
MCH: 21.3 pg — AB (ref 26.0–34.0)
MCHC: 31.3 g/dL (ref 30.0–36.0)
MCV: 68.1 fL — ABNORMAL LOW (ref 78.0–100.0)
MONO ABS: 0.4 10*3/uL (ref 0.1–1.0)
Monocytes Relative: 9 % (ref 3–12)
NEUTROS ABS: 2.8 10*3/uL (ref 1.7–7.7)
Neutrophils Relative %: 56 % (ref 43–77)
Platelets: 187 10*3/uL (ref 150–400)
RBC: 4.51 MIL/uL (ref 3.87–5.11)
RDW: 16.7 % — ABNORMAL HIGH (ref 11.5–15.5)
WBC: 4.8 10*3/uL (ref 4.0–10.5)

## 2014-08-18 LAB — SURGICAL PCR SCREEN
MRSA, PCR: NEGATIVE
STAPHYLOCOCCUS AUREUS: POSITIVE — AB

## 2014-08-18 NOTE — Progress Notes (Signed)
EKG and CXR done 05/01/14 EPIC

## 2014-08-18 NOTE — Patient Instructions (Addendum)
Annamary JAZMYNN PHO  08/18/2014   Your procedure is scheduled on:  08/19/2014    Come thru the [facility] entrance.  .  Follow the Signs to [place] at   [place]    am  Call this number if you have problems the morning of surgery: [phone]   Remember:   Do not eat food or drink liquids after midnight.   Take these medicines the morning of surgery with A SIP OF WATER: Albuterol Inhaler if needed and bring with you    Do not wear jewelry, make-up or nail polish.  Do not wear lotions, powders, or perfumes.  deodorant.  Do not shve 48 hours prior to surgery.  .   Do not bring valuables to the hospital.  Contacts, dentures or bridgework may not be worn into surgery.  Leave suitcase in the car. After surgery it may be brought to your room.  For patients admitted to the hospital, checkout time is 11:00 AM the day of  discharge.     Please read over the following fact sheets that you were given: MRSA Information, coughing and deep breathing exercises, leg exercises            [facility] - Preparing for Surgery Before surgery, you can play an important role.  Because skin is not sterile, your skin needs to be as free of germs as possible.  You can reduce the number of germs on your skin by washing with CHG (chlorahexidine gluconate) soap before surgery.  CHG is an antiseptic cleaner which kills germs and bonds with the skin to continue killing germs even after washing. Please DO NOT use if you have an allergy to CHG or antibacterial soaps.  If your skin becomes reddened/irritated stop using the CHG and inform your nurse when you arrive at Short Stay. Do not shave (including legs and underarms) for at least 48 hours prior to the first CHG shower.  You may shave your face/neck. Please follow these instructions carefully:  1.  Shower with CHG Soap the night before surgery and the  morning of Surgery.  2.  If you choose to wash your hair, wash your hair first as usual with your  normal   shampoo.  3.  After you shampoo, rinse your hair and body thoroughly to remove the  shampoo.                           4.  Use CHG as you would any other liquid soap.  You can apply chg directly  to the skin and wash                       Gently with a scrungie or clean washcloth.  5.  Apply the CHG Soap to your body ONLY FROM THE NECK DOWN.   Do not use on face/ open                           Wound or open sores. Avoid contact with eyes, ears mouth and genitals (private parts).                       Wash face,  Genitals (private parts) with your normal soap.             6.  Wash thoroughly, paying special attention to the area where your surgery  will be performed.  7.  Thoroughly rinse your body with warm water from the neck down.  8.  DO NOT shower/wash with your normal soap after using and rinsing off  the CHG Soap.                9.  Pat yourself dry with a clean towel.            10.  Wear clean pajamas.            11.  Place clean sheets on your bed the night of your first shower and do not  sleep with pets. Day of Surgery : Do not apply any lotions/deodorants the morning of surgery.  Please wear clean clothes to the hospital/surgery center.  FAILURE TO FOLLOW THESE INSTRUCTIONS MAY RESULT IN THE CANCELLATION OF YOUR SURGERY PATIENT SIGNATURE_________________________________  NURSE SIGNATURE__________________________________  ________________________________________________________________________

## 2014-08-19 ENCOUNTER — Inpatient Hospital Stay (HOSPITAL_COMMUNITY): Payer: BC Managed Care – PPO | Admitting: Anesthesiology

## 2014-08-19 ENCOUNTER — Inpatient Hospital Stay (HOSPITAL_COMMUNITY)
Admission: RE | Admit: 2014-08-19 | Discharge: 2014-08-21 | DRG: 621 | Disposition: A | Discharge status: Home / Self Care | Payer: BC Managed Care – PPO | Source: Ambulatory Visit | Attending: General Surgery | Admitting: General Surgery

## 2014-08-19 ENCOUNTER — Encounter (HOSPITAL_COMMUNITY)
Admission: RE | Disposition: A | Discharge status: Home / Self Care | Payer: Self-pay | Source: Ambulatory Visit | Attending: General Surgery

## 2014-08-19 ENCOUNTER — Encounter (HOSPITAL_COMMUNITY): Payer: Self-pay | Admitting: *Deleted

## 2014-08-19 DIAGNOSIS — E039 Hypothyroidism, unspecified: Secondary | ICD-10-CM | POA: Diagnosis present

## 2014-08-19 DIAGNOSIS — G8929 Other chronic pain: Secondary | ICD-10-CM | POA: Diagnosis present

## 2014-08-19 DIAGNOSIS — I10 Essential (primary) hypertension: Secondary | ICD-10-CM | POA: Diagnosis present

## 2014-08-19 DIAGNOSIS — R51 Headache: Secondary | ICD-10-CM | POA: Diagnosis not present

## 2014-08-19 DIAGNOSIS — E785 Hyperlipidemia, unspecified: Secondary | ICD-10-CM | POA: Diagnosis present

## 2014-08-19 DIAGNOSIS — J45909 Unspecified asthma, uncomplicated: Secondary | ICD-10-CM | POA: Diagnosis present

## 2014-08-19 DIAGNOSIS — Z01812 Encounter for preprocedural laboratory examination: Secondary | ICD-10-CM | POA: Diagnosis not present

## 2014-08-19 DIAGNOSIS — Z9884 Bariatric surgery status: Secondary | ICD-10-CM

## 2014-08-19 DIAGNOSIS — D649 Anemia, unspecified: Secondary | ICD-10-CM | POA: Diagnosis present

## 2014-08-19 DIAGNOSIS — Z6841 Body Mass Index (BMI) 40.0 and over, adult: Secondary | ICD-10-CM | POA: Diagnosis not present

## 2014-08-19 DIAGNOSIS — Z87891 Personal history of nicotine dependence: Secondary | ICD-10-CM

## 2014-08-19 HISTORY — PX: UPPER GI ENDOSCOPY: SHX6162

## 2014-08-19 HISTORY — PX: LAPAROSCOPIC GASTRIC SLEEVE RESECTION: SHX5895

## 2014-08-19 LAB — HEMOGLOBIN AND HEMATOCRIT, BLOOD
HEMATOCRIT: 31.3 % — AB (ref 36.0–46.0)
Hemoglobin: 9.7 g/dL — ABNORMAL LOW (ref 12.0–15.0)

## 2014-08-19 SURGERY — GASTRECTOMY, SLEEVE, LAPAROSCOPIC
Anesthesia: General | Site: Abdomen

## 2014-08-19 MED ORDER — MUPIROCIN 2 % EX OINT: 1.0000 "application " | TOPICAL_OINTMENT | Freq: Once | CUTANEOUS | Status: DC

## 2014-08-19 MED ORDER — PROPOFOL 10 MG/ML IV BOLUS
INTRAVENOUS | Status: DC | PRN
  Administered 2014-08-19: 200 mg via INTRAVENOUS

## 2014-08-19 MED ORDER — CISATRACURIUM BESYLATE (PF) 10 MG/5ML IV SOLN
INTRAVENOUS | Status: DC | PRN
  Administered 2014-08-19: 7 mg via INTRAVENOUS
  Administered 2014-08-19: 4 mg via INTRAVENOUS

## 2014-08-19 MED ORDER — METOCLOPRAMIDE HCL 5 MG/ML IJ SOLN
INTRAMUSCULAR | Status: AC
  Filled 2014-08-19: qty 2

## 2014-08-19 MED ORDER — ONDANSETRON HCL 4 MG/2ML IJ SOLN
INTRAMUSCULAR | Status: DC | PRN
  Administered 2014-08-19: 4 mg via INTRAVENOUS

## 2014-08-19 MED ORDER — PROPOFOL 10 MG/ML IV BOLUS
INTRAVENOUS | Status: AC
  Filled 2014-08-19: qty 20

## 2014-08-19 MED ORDER — TISSEEL VH 10 ML EX KIT
PACK | CUTANEOUS | Status: DC | PRN
  Administered 2014-08-19: 1

## 2014-08-19 MED ORDER — ONDANSETRON HCL 4 MG/2ML IJ SOLN
4.0000 mg | INTRAMUSCULAR | Status: DC | PRN
  Administered 2014-08-19 – 2014-08-20 (×4): 4 mg via INTRAVENOUS
  Filled 2014-08-19 (×4): qty 2

## 2014-08-19 MED ORDER — HYDROMORPHONE HCL 1 MG/ML IJ SOLN: 0.2500 mg | INTRAMUSCULAR | Status: DC | PRN

## 2014-08-19 MED ORDER — GLYCOPYRROLATE 0.2 MG/ML IJ SOLN
INTRAMUSCULAR | Status: AC
  Filled 2014-08-19: qty 3

## 2014-08-19 MED ORDER — LACTATED RINGERS IV SOLN: INTRAVENOUS | Status: DC

## 2014-08-19 MED ORDER — DEXTROSE 5 % IV SOLN
2.0000 g | INTRAVENOUS | Status: AC
  Administered 2014-08-19: 2 g via INTRAVENOUS

## 2014-08-19 MED ORDER — PROMETHAZINE HCL 25 MG/ML IJ SOLN
INTRAMUSCULAR | Status: AC
  Filled 2014-08-19: qty 1

## 2014-08-19 MED ORDER — GLYCOPYRROLATE 0.2 MG/ML IJ SOLN
INTRAMUSCULAR | Status: DC | PRN
  Administered 2014-08-19: 0.6 mg via INTRAVENOUS

## 2014-08-19 MED ORDER — ONDANSETRON HCL 4 MG/2ML IJ SOLN
INTRAMUSCULAR | Status: AC
  Filled 2014-08-19: qty 2

## 2014-08-19 MED ORDER — BUPIVACAINE-EPINEPHRINE (PF) 0.25% -1:200000 IJ SOLN
INTRAMUSCULAR | Status: AC
Start: 2014-08-19 — End: 2014-08-19
  Filled 2014-08-19: qty 30

## 2014-08-19 MED ORDER — 0.9 % SODIUM CHLORIDE (POUR BTL) OPTIME
TOPICAL | Status: DC | PRN
  Administered 2014-08-19: 1000 mL

## 2014-08-19 MED ORDER — FENTANYL CITRATE 0.05 MG/ML IJ SOLN
INTRAMUSCULAR | Status: AC
  Filled 2014-08-19: qty 5

## 2014-08-19 MED ORDER — NEOSTIGMINE METHYLSULFATE 10 MG/10ML IV SOLN
INTRAVENOUS | Status: DC | PRN
  Administered 2014-08-19: 4 mg via INTRAVENOUS

## 2014-08-19 MED ORDER — SCOPOLAMINE 1 MG/3DAYS TD PT72
1.0000 | MEDICATED_PATCH | TRANSDERMAL | Status: DC
  Administered 2014-08-19: 1.5 mg via TRANSDERMAL
  Filled 2014-08-19: qty 1

## 2014-08-19 MED ORDER — CHLORHEXIDINE GLUCONATE CLOTH 2 % EX PADS: 6.0000 | MEDICATED_PAD | Freq: Once | CUTANEOUS | Status: DC

## 2014-08-19 MED ORDER — UNJURY VANILLA POWDER
2.0000 [oz_av] | Freq: Four times a day (QID) | ORAL | Status: DC
  Administered 2014-08-21 (×2): 2 [oz_av] via ORAL

## 2014-08-19 MED ORDER — LIDOCAINE HCL (CARDIAC) 20 MG/ML IV SOLN
INTRAVENOUS | Status: DC | PRN
  Administered 2014-08-19: 100 mg via INTRAVENOUS

## 2014-08-19 MED ORDER — ALBUTEROL SULFATE (2.5 MG/3ML) 0.083% IN NEBU: 2.5000 mg | INHALATION_SOLUTION | Freq: Four times a day (QID) | RESPIRATORY_TRACT | Status: DC | PRN

## 2014-08-19 MED ORDER — SUCCINYLCHOLINE CHLORIDE 20 MG/ML IJ SOLN
INTRAMUSCULAR | Status: DC | PRN
  Administered 2014-08-19: 100 mg via INTRAVENOUS

## 2014-08-19 MED ORDER — UNJURY CHICKEN SOUP POWDER: 2.0000 [oz_av] | Freq: Four times a day (QID) | ORAL | Status: DC

## 2014-08-19 MED ORDER — HEPARIN SODIUM (PORCINE) 5000 UNIT/ML IJ SOLN
5000.0000 [IU] | INTRAMUSCULAR | Status: AC
  Administered 2014-08-19: 5000 [IU] via SUBCUTANEOUS
  Filled 2014-08-19: qty 1

## 2014-08-19 MED ORDER — TISSEEL VH 10 ML EX KIT
PACK | CUTANEOUS | Status: AC
  Filled 2014-08-19: qty 1

## 2014-08-19 MED ORDER — LACTATED RINGERS IR SOLN
Status: DC | PRN
  Administered 2014-08-19: 1000 mL

## 2014-08-19 MED ORDER — SCOPOLAMINE 1 MG/3DAYS TD PT72
MEDICATED_PATCH | TRANSDERMAL | Status: AC
  Filled 2014-08-19: qty 1

## 2014-08-19 MED ORDER — ACETAMINOPHEN 160 MG/5ML PO SOLN: 325.0000 mg | ORAL | Status: DC | PRN

## 2014-08-19 MED ORDER — FENTANYL CITRATE 0.05 MG/ML IJ SOLN
INTRAMUSCULAR | Status: DC | PRN
  Administered 2014-08-19 (×2): 50 ug via INTRAVENOUS
  Administered 2014-08-19: 100 ug via INTRAVENOUS

## 2014-08-19 MED ORDER — METOCLOPRAMIDE HCL 5 MG/ML IJ SOLN
INTRAMUSCULAR | Status: DC | PRN
  Administered 2014-08-19: 10 mg via INTRAVENOUS

## 2014-08-19 MED ORDER — HEPARIN SODIUM (PORCINE) 5000 UNIT/ML IJ SOLN
5000.0000 [IU] | Freq: Three times a day (TID) | INTRAMUSCULAR | Status: DC
  Administered 2014-08-19 – 2014-08-21 (×5): 5000 [IU] via SUBCUTANEOUS
  Filled 2014-08-19 (×8): qty 1

## 2014-08-19 MED ORDER — ACETAMINOPHEN 160 MG/5ML PO SOLN: 650.0000 mg | ORAL | Status: DC | PRN

## 2014-08-19 MED ORDER — HYDRALAZINE HCL 20 MG/ML IJ SOLN
INTRAMUSCULAR | Status: DC | PRN
  Administered 2014-08-19 (×2): 4 mg via INTRAVENOUS

## 2014-08-19 MED ORDER — MIDAZOLAM HCL 5 MG/5ML IJ SOLN
INTRAMUSCULAR | Status: DC | PRN
  Administered 2014-08-19: 2 mg via INTRAVENOUS

## 2014-08-19 MED ORDER — LACTATED RINGERS IV SOLN
INTRAVENOUS | Status: DC | PRN
  Administered 2014-08-19 (×2): via INTRAVENOUS

## 2014-08-19 MED ORDER — MUPIROCIN 2 % EX OINT
TOPICAL_OINTMENT | CUTANEOUS | Status: AC
  Administered 2014-08-19: 08:00:00 via NASAL
  Filled 2014-08-19: qty 22

## 2014-08-19 MED ORDER — MIDAZOLAM HCL 2 MG/2ML IJ SOLN
INTRAMUSCULAR | Status: AC
  Filled 2014-08-19: qty 2

## 2014-08-19 MED ORDER — NEOSTIGMINE METHYLSULFATE 10 MG/10ML IV SOLN
INTRAVENOUS | Status: AC
  Filled 2014-08-19: qty 1

## 2014-08-19 MED ORDER — CETYLPYRIDINIUM CHLORIDE 0.05 % MT LIQD
7.0000 mL | Freq: Two times a day (BID) | OROMUCOSAL | Status: DC
  Administered 2014-08-20: 7 mL via OROMUCOSAL

## 2014-08-19 MED ORDER — POTASSIUM CHLORIDE IN NACL 20-0.9 MEQ/L-% IV SOLN
INTRAVENOUS | Status: DC
  Administered 2014-08-19 – 2014-08-21 (×4): via INTRAVENOUS
  Filled 2014-08-19 (×6): qty 1000

## 2014-08-19 MED ORDER — LACTATED RINGERS IV SOLN
INTRAVENOUS | Status: DC
  Administered 2014-08-19 (×2): 1000 mL via INTRAVENOUS

## 2014-08-19 MED ORDER — BUPIVACAINE-EPINEPHRINE 0.25% -1:200000 IJ SOLN
INTRAMUSCULAR | Status: DC | PRN
  Administered 2014-08-19: 42 mL

## 2014-08-19 MED ORDER — MORPHINE SULFATE 2 MG/ML IJ SOLN
2.0000 mg | INTRAMUSCULAR | Status: DC | PRN
  Administered 2014-08-19 – 2014-08-20 (×2): 2 mg via INTRAVENOUS
  Filled 2014-08-19 (×2): qty 1

## 2014-08-19 MED ORDER — OXYCODONE HCL 5 MG/5ML PO SOLN
5.0000 mg | ORAL | Status: DC | PRN
  Administered 2014-08-20 – 2014-08-21 (×3): 10 mg via ORAL
  Filled 2014-08-19 (×3): qty 10

## 2014-08-19 MED ORDER — DEXAMETHASONE SODIUM PHOSPHATE 10 MG/ML IJ SOLN
INTRAMUSCULAR | Status: AC
  Filled 2014-08-19: qty 1

## 2014-08-19 MED ORDER — BUPIVACAINE-EPINEPHRINE (PF) 0.25% -1:200000 IJ SOLN
INTRAMUSCULAR | Status: AC
  Filled 2014-08-19: qty 30

## 2014-08-19 MED ORDER — LIDOCAINE HCL (CARDIAC) 20 MG/ML IV SOLN
INTRAVENOUS | Status: AC
  Filled 2014-08-19: qty 5

## 2014-08-19 MED ORDER — UNJURY CHOCOLATE CLASSIC POWDER: 2.0000 [oz_av] | Freq: Four times a day (QID) | ORAL | Status: DC

## 2014-08-19 MED ORDER — CHLORHEXIDINE GLUCONATE 0.12 % MT SOLN
15.0000 mL | Freq: Two times a day (BID) | OROMUCOSAL | Status: DC
  Administered 2014-08-19 – 2014-08-21 (×2): 15 mL via OROMUCOSAL
  Filled 2014-08-19 (×6): qty 15

## 2014-08-19 MED ORDER — DEXAMETHASONE SODIUM PHOSPHATE 10 MG/ML IJ SOLN
INTRAMUSCULAR | Status: DC | PRN
  Administered 2014-08-19: 10 mg via INTRAVENOUS

## 2014-08-19 MED ORDER — PROMETHAZINE HCL 25 MG/ML IJ SOLN
12.5000 mg | INTRAMUSCULAR | Status: DC | PRN
  Administered 2014-08-19: 12.5 mg via INTRAVENOUS

## 2014-08-19 MED ORDER — HYDRALAZINE HCL 20 MG/ML IJ SOLN
INTRAMUSCULAR | Status: AC
  Filled 2014-08-19: qty 1

## 2014-08-19 MED ORDER — CISATRACURIUM BESYLATE 20 MG/10ML IV SOLN
INTRAVENOUS | Status: AC
  Filled 2014-08-19: qty 10

## 2014-08-19 MED ORDER — DEXTROSE 5 % IV SOLN
INTRAVENOUS | Status: AC
  Filled 2014-08-19: qty 2

## 2014-08-19 SURGICAL SUPPLY — 66 items
ADH SKN CLS APL DERMABOND .7 (GAUZE/BANDAGES/DRESSINGS) ×2
APL SRG 32X5 SNPLK LF DISP (MISCELLANEOUS) ×2
APPLICATOR COTTON TIP 6IN STRL (MISCELLANEOUS) IMPLANT
APPLIER CLIP ROT 10 11.4 M/L (STAPLE)
APPLIER CLIP ROT 13.4 12 LRG (CLIP) ×3
APR CLP LRG 13.4X12 ROT 20 MLT (CLIP) ×2
APR CLP MED LRG 11.4X10 (STAPLE)
BAG SPEC RTRVL LRG 6X4 10 (ENDOMECHANICALS)
BLADE SURG SZ11 CARB STEEL (BLADE) ×3 IMPLANT
CABLE HIGH FREQUENCY MONO STRZ (ELECTRODE) IMPLANT
CANISTER SUCTION 2500CC (MISCELLANEOUS) IMPLANT
CHLORAPREP W/TINT 26ML (MISCELLANEOUS) ×3 IMPLANT
CLIP APPLIE ROT 10 11.4 M/L (STAPLE) IMPLANT
CLIP APPLIE ROT 13.4 12 LRG (CLIP) ×2 IMPLANT
DERMABOND ADVANCED (GAUZE/BANDAGES/DRESSINGS) ×1
DERMABOND ADVANCED .7 DNX12 (GAUZE/BANDAGES/DRESSINGS) IMPLANT
DEVICE SUTURE ENDOST 10MM (ENDOMECHANICALS) IMPLANT
DEVICE TROCAR PUNCTURE CLOSURE (ENDOMECHANICALS) ×3 IMPLANT
DRAPE CAMERA CLOSED 9X96 (DRAPES) ×3 IMPLANT
DRAPE UNIVERSAL PACK (DRAPES) ×3 IMPLANT
DRAPE UTILITY XL STRL (DRAPES) ×6 IMPLANT
ELECT REM PT RETURN 9FT ADLT (ELECTROSURGICAL) ×3
ELECTRODE REM PT RTRN 9FT ADLT (ELECTROSURGICAL) ×2 IMPLANT
GAUZE SPONGE 4X4 12PLY STRL (GAUZE/BANDAGES/DRESSINGS) IMPLANT
GLOVE BIOGEL PI IND STRL 7.5 (GLOVE) ×2 IMPLANT
GLOVE BIOGEL PI INDICATOR 7.5 (GLOVE) ×1
GLOVE SS BIOGEL STRL SZ 7.5 (GLOVE) ×2 IMPLANT
GLOVE SUPERSENSE BIOGEL SZ 7.5 (GLOVE) ×1
GOWN STRL REUS W/TWL XL LVL3 (GOWN DISPOSABLE) ×12 IMPLANT
HOVERMATT SINGLE USE (MISCELLANEOUS) ×3 IMPLANT
KIT BASIN OR (CUSTOM PROCEDURE TRAY) ×3 IMPLANT
LIQUID BAND (GAUZE/BANDAGES/DRESSINGS) ×3 IMPLANT
MANIFOLD NEPTUNE II (INSTRUMENTS) ×1 IMPLANT
NDL SPNL 22GX3.5 QUINCKE BK (NEEDLE) ×2 IMPLANT
NEEDLE SPNL 22GX3.5 QUINCKE BK (NEEDLE) ×3 IMPLANT
PEN SKIN MARKING BROAD (MISCELLANEOUS) ×3 IMPLANT
POUCH SPECIMEN RETRIEVAL 10MM (ENDOMECHANICALS) IMPLANT
RELOAD STAPLE 60 3.6 BLU REG (STAPLE) ×2 IMPLANT
RELOAD STAPLE 60 4.1 GRN THCK (STAPLE) ×1 IMPLANT
RELOAD STAPLER BLUE 60MM (STAPLE) ×8 IMPLANT
RELOAD STAPLER GOLD 60MM (STAPLE) ×2 IMPLANT
RELOAD STAPLER GREEN 60MM (STAPLE) ×2 IMPLANT
SCISSORS LAP 5X35 DISP (ENDOMECHANICALS) IMPLANT
SEALANT SURGICAL APPL DUAL CAN (MISCELLANEOUS) ×3 IMPLANT
SET IRRIG TUBING LAPAROSCOPIC (IRRIGATION / IRRIGATOR) ×3 IMPLANT
SHEARS CURVED HARMONIC AC 45CM (MISCELLANEOUS) ×3 IMPLANT
SLEEVE ADV FIXATION 5X100MM (TROCAR) ×3 IMPLANT
SLEEVE GASTRECTOMY 36FR VISIGI (MISCELLANEOUS) ×3 IMPLANT
SOLUTION ANTI FOG 6CC (MISCELLANEOUS) ×3 IMPLANT
SPONGE LAP 18X18 X RAY DECT (DISPOSABLE) ×3 IMPLANT
STAPLE ECHEON FLEX 60 POW ENDO (STAPLE) ×3 IMPLANT
STAPLER RELOAD BLUE 60MM (STAPLE) ×12
STAPLER RELOAD GOLD 60MM (STAPLE) ×3
STAPLER RELOAD GREEN 60MM (STAPLE) ×3
SUT ETHILON 2 0 PS N (SUTURE) IMPLANT
SUT MNCRL AB 4-0 PS2 18 (SUTURE) ×3 IMPLANT
SUT VICRYL 0 UR6 27IN ABS (SUTURE) ×1 IMPLANT
SYR 20CC LL (SYRINGE) ×3 IMPLANT
TOWEL OR NON WOVEN STRL DISP B (DISPOSABLE) ×3 IMPLANT
TRAY FOLEY CATH 14FRSI W/METER (CATHETERS) ×2 IMPLANT
TROCAR ADV FIXATION 5X100MM (TROCAR) ×3 IMPLANT
TROCAR BLADELESS 15MM (ENDOMECHANICALS) ×3 IMPLANT
TROCAR XCEL NON-BLD 5MMX100MML (ENDOMECHANICALS) ×3 IMPLANT
TUBING CONNECTING 10 (TUBING) ×3 IMPLANT
TUBING ENDO SMARTCAP PENTAX (MISCELLANEOUS) ×3 IMPLANT
TUBING FILTER THERMOFLATOR (ELECTROSURGICAL) ×3 IMPLANT

## 2014-08-19 NOTE — Anesthesia Preprocedure Evaluation (Addendum)
Anesthesia Evaluation  Patient identified by MRN, date of birth, ID band Patient awake    Reviewed: Allergy & Precautions, H&P , NPO status , Patient's Chart, lab work & pertinent test results  History of Anesthesia Complications (+) PONV  Airway Mallampati: II  TM Distance: >3 FB Neck ROM: full    Dental  (+) Missing, Dental Advisory Given Missing 2 front upper:   Pulmonary asthma , former smoker,  breath sounds clear to auscultation  Pulmonary exam normal       Cardiovascular Exercise Tolerance: Good hypertension, Rhythm:regular Rate:Normal     Neuro/Psych negative neurological ROS  negative psych ROS   GI/Hepatic negative GI ROS, Neg liver ROS,   Endo/Other  Hypothyroidism Morbid obesityPituitary adenoma  Renal/GU negative Renal ROS  negative genitourinary   Musculoskeletal   Abdominal (+) + obese,   Peds  Hematology negative hematology ROS (+)   Anesthesia Other Findings Lupus  Reproductive/Obstetrics negative OB ROS                            Anesthesia Physical Anesthesia Plan  ASA: III  Anesthesia Plan: General   Post-op Pain Management:    Induction: Intravenous  Airway Management Planned: Oral ETT  Additional Equipment:   Intra-op Plan:   Post-operative Plan: Extubation in OR  Informed Consent: I have reviewed the patients History and Physical, chart, labs and discussed the procedure including the risks, benefits and alternatives for the proposed anesthesia with the patient or authorized representative who has indicated his/her understanding and acceptance.   Dental Advisory Given  Plan Discussed with: CRNA and Surgeon  Anesthesia Plan Comments:         Anesthesia Quick Evaluation

## 2014-08-19 NOTE — Interval H&P Note (Signed)
History and Physical Interval Note:  08/19/2014 9:58 AM  Michelle Huffman  has presented today for surgery, with the diagnosis of Morbid Obesity  The various methods of treatment have been discussed with the patient and family. After consideration of risks, benefits and other options for treatment, the patient has consented to  Procedure(s): LAPAROSCOPIC GASTRIC SLEEVE RESECTION (N/A) as a surgical intervention .  The patient's history has been reviewed, patient examined, no change in status, stable for surgery.  I have reviewed the patient's chart and labs.  Questions were answered to the patient's satisfaction.     Alynah Schone T

## 2014-08-19 NOTE — H&P (Signed)
History of Present Illness Michelle Huffman T. Vittoria Noreen MD; 08/08/2014 12:46 PM) Patient words: gastric.  The patient is a 48 year old female who presents with obesity. she returns for a pre op visit prior to planned sleeve gastrectomy with a BMI of 39 and comorbidities of chronic joint pain and hypertension and mild hyperlipidemia.  No concerns on psych her nutrition evaluation. We reviewed her lab work which showed some mildly elevated lipids, chronic moderate anemia. UPPER GI series was unremarkable with no evidence of hiatal hernia   Other Problems Davy Pique Bynum, CMA; 08/08/2014 12:26 PM) Arthritis Asthma High blood pressure  Past Surgical History Marjean Donna, CMA; 08/08/2014 12:26 PM) Foot Surgery Bilateral. Gallbladder Surgery - Laparoscopic Hysterectomy (not due to cancer) - Partial Sleeve Gastrectomy  Diagnostic Studies History Marjean Donna, CMA; 08/08/2014 12:26 PM) Colonoscopy never Mammogram 1-3 years ago Pap Smear 1-5 years ago  Allergies Marjean Donna, CMA; 08/08/2014 12:27 PM) Sulfa Antibiotics  Medication History (Sonya Bynum, CMA; 08/08/2014 12:29 PM) Albuterol Sulfate ((5 MG/ML)0.5% Nebulized Soln, Inhalation as needed) Active. Flexeril (10MG  Tablet, Oral daily) Active. Hydrochlorothiazide (25MG  Tablet, Oral) Active. Ibuprofen (800MG  Tablet, Oral) Active. TraMADol HCl (50MG  Tablet, Oral daily) Active.  Social History Marjean Donna, [place]; 08/08/2014 12:26 PM) Alcohol use Occasional alcohol use. Caffeine use Tea. Illicit drug use Remotely quit drug use. Tobacco use Former smoker.  Family History Marjean Donna, [place]; 08/08/2014 12:26 PM) Arthritis Mother. Depression Mother. Diabetes Mellitus Father. Hypertension Father.  Pregnancy / Birth History Marjean Donna, [place]; 08/08/2014 12:26 PM) Age at menarche 5 years. Contraceptive History Intrauterine device. Gravida 2 Maternal age 50-35 Para 1 Regular periods  Review of Systems ([place]; 08/08/2014 12:26 PM) General Not Present- Appetite Loss, Chills, Fatigue, Fever, Night Sweats, Weight Gain and Weight Loss. Skin Not Present- Change in Wart/Mole, Dryness, Hives, Jaundice, New Lesions, Non-Healing Wounds, Rash and Ulcer. HEENT Not Present- Earache, Hearing Loss, Hoarseness, Nose Bleed, Oral Ulcers, Ringing in the Ears, Seasonal Allergies, Sinus Pain, Sore Throat, Visual Disturbances, Wears glasses/contact lenses and Yellow Eyes. Respiratory Not Present- Bloody sputum, Chronic Cough, Difficulty Breathing, Snoring and Wheezing. Breast Not Present- Breast Mass, Breast Pain, Nipple Discharge and Skin Changes. Cardiovascular Not Present- Chest Pain, Difficulty Breathing Lying Down, Leg Cramps, Palpitations, Rapid Heart Rate, Shortness of Breath and Swelling of Extremities. Gastrointestinal Not Present- Abdominal Pain, Bloating, Bloody Stool, Change in Bowel Habits, Chronic diarrhea, Constipation, Difficulty Swallowing, Excessive gas, Gets full quickly at meals, Hemorrhoids, Indigestion, Nausea, Rectal Pain and Vomiting. Female Genitourinary Not Present- Frequency, Nocturia, Painful Urination, Pelvic Pain and Urgency. Musculoskeletal Not Present- Back Pain, Joint Pain, Joint Stiffness, Muscle Pain, Muscle Weakness and Swelling of Extremities. Neurological Not Present- Decreased Memory, Fainting, Headaches, Numbness, Seizures, Tingling, Tremor, Trouble walking and Weakness. Psychiatric Not Present- Anxiety, Bipolar, Change in Sleep Pattern, Depression, Fearful and Frequent crying. Endocrine Not Present- Cold Intolerance, Excessive Hunger, Hair Changes, Heat Intolerance, Hot flashes and New Diabetes. Hematology Not Present- Easy Bruising, Excessive bleeding, Gland problems, HIV and Persistent Infections.   Vitals (Sonya Bynum CMA; 08/08/2014 12:31 PM) 08/08/2014 12:30 PM Weight: 236 lb Height: 64in Body Surface Area: 2.2 m Body Mass Index: 40.51 kg/m Temp.:  20F(Temporal)  Pulse: 79 (Regular)  BP: 136/80 (Sitting, Left Arm, Standard)    Physical Exam Michelle Huffman T. Eriona Kinchen MD; 08/08/2014 12:46 PM) The physical exam findings are as follows: Note:General: Alert, morbidly obese African-American female, in no distress Skin: Warm and dry without rash or infection. HEENT: No palpable masses or thyromegaly. Sclera nonicteric. Pupils equal round  and reactive. Oropharynx clear. Lymph nodes: No cervical, supraclavicular, or inguinal nodes palpable. Lungs: Breath sounds clear and equal. No wheezing or increased work of breathing. Cardiovascular: Regular rate and rhythm without murmer. No JVD or edema. Peripheral pulses intact. No carotid bruits. Abdomen: Nondistended. Soft and nontender. No masses palpable. No organomegaly. No palpable hernias. Extremities: No edema or joint swelling or deformity. No chronic venous stasis changes. Neurologic: Alert and fully oriented. Gait normal. No focal weakness. Psychiatric: Normal mood and affect. Thought content appropriate with normal judgement and insight    Assessment & Plan Michelle Huffman T. Baleigh Rennaker MD; 08/08/2014 12:48 PM) MORBID OBESITY (278.01  E66.01) Impression: ready to proceed with planned laparoscopic sleeve gastrectomy. We reviewed the procedure and all her questions were answered. consent form was reviewed. She was given a prescription for her preoperative pain medication.  Current Plans  Started OxyCODONE HCl 5MG /5ML, 5-10 Milliliter every four hours, as needed, 200 Milliliter, 08/08/2014, No Refill.

## 2014-08-19 NOTE — Anesthesia Postprocedure Evaluation (Signed)
  Anesthesia Post-op Note  Patient: Michelle Huffman  Procedure(s) Performed: Procedure(s) (LRB): LAPAROSCOPIC GASTRIC SLEEVE RESECTION (N/A) UPPER GI ENDOSCOPY  Patient Location: PACU  Anesthesia Type: General  Level of Consciousness: awake and alert   Airway and Oxygen Therapy: Patient Spontanous Breathing  Post-op Pain: mild  Post-op Assessment: Post-op Vital signs reviewed, Patient's Cardiovascular Status Stable, Respiratory Function Stable, Patent Airway and No signs of Nausea or vomiting  Last Vitals:  Filed Vitals:   08/19/14 1415  BP: 148/89  Pulse: 89  Temp: 36.7 C  Resp: 20    Post-op Vital Signs: stable   Complications: No apparent anesthesia complications

## 2014-08-19 NOTE — Op Note (Signed)
Michelle Huffman [id number] [birth year] 08/19/2014  Preoperative diagnosis: morbid obesity  Postoperative diagnosis: Same   Procedure: Esophagogastroduodenoscopy   Surgeon: Leighton Ruff. Astin Rape M.D., FACS   Anesthesia: Gen.   Indications for procedure: 48year old female undergoing Laparoscopic Gastric Sleeve Resection and an EGD was requested to evaluate the new gastric sleeve.   Description of procedure: After we have completed the sleeve resection, I scrubbed out and obtained the Olympus endoscope. I gently placed endoscope in the patient's oropharynx and gently glided it down the esophagus without any difficulty under direct visualization. Once I was in the gastric sleeve, I insufflated the stomach with air. I was able to cannulate and advanced the scope through the gastric sleeve. I was able to cannulate the duodenum with ease. Dr. Excell Seltzer had placed saline in the upper abdomen. Upon further insufflation of the gastric sleeve there was no evidence of bubbles.  Upon further inspection of the gastric sleeve, the mucosa appeared normal. There is no evidence of any mucosal abnormality. The sleeve was widely patent at the angularis. There was no evidence of bleeding. The gastric sleeve was decompressed. The scope was withdrawn. The patient tolerated this portion of the procedure well. Please see Dr Lear Ng operative note for details regarding the laparoscopic gastric sleeve resection.   Leighton Ruff. Redmond Pulling, MD, FACS  General, Bariatric, & Minimally Invasive Surgery  [facility], [place]

## 2014-08-19 NOTE — Plan of Care (Signed)
Problem: Phase I Progression Outcomes Goal: OOB as tolerated unless otherwise ordered Outcome: Progressing Goal: Voiding-avoid urinary catheter unless indicated Outcome: Completed/Met Date Met:  08/19/14 Goal: Diet - NPO Outcome: Completed/Met Date Met:  08/19/14

## 2014-08-19 NOTE — Transfer of Care (Signed)
Immediate Anesthesia Transfer of Care Note  Patient: Michelle Huffman  Procedure(s) Performed: Procedure(s): LAPAROSCOPIC GASTRIC SLEEVE RESECTION (N/A) UPPER GI ENDOSCOPY  Patient Location: PACU  Anesthesia Type:General  Level of Consciousness: awake, sedated and patient cooperative  Airway & Oxygen Therapy: Patient Spontanous Breathing and Patient connected to face mask oxygen  Post-op Assessment: Report given to PACU RN and Post -op Vital signs reviewed and stable  Post vital signs: Reviewed and stable  Complications: No apparent anesthesia complications

## 2014-08-19 NOTE — Op Note (Signed)
Preoperative Diagnosis: Morbid Obesity  Postoprative Diagnosis: Morbid Obesity  Procedure: Procedure(s): LAPAROSCOPIC GASTRIC SLEEVE RESECTION UPPER GI ENDOSCOPY   Surgeon: Excell Seltzer T   Assistants: Greer Pickerel  Anesthesia:  General endotracheal anesthesia  Indications: Patient is a 48 year old female with progressive morbid obesity unresponsive to medical management. After extensive preoperative workup and discussion detailed elsewhere we have elected to proceed with laparoscopic sleeve gastrectomy for surgical treatment of her morbid obesity.    Procedure Detail:  Patient was brought to the operating room, placed in supine position on the operating table, and general endotracheal anesthesia was induced. She received preoperative IV antibiotics and subcutaneous heparin. PAS replaced. The abdomen was widely sterilely prepped and draped. Patient timeout was performed and correct procedure verified. Access was obtained with a 5 mm Optiview trocar in the left upper quadrant and pneumoperitoneum established without difficulty. Under direct vision a 5 mm trocar was placed in the right upper quadrant, a 15 mm trocar more medially at the base of the falciform ligament and a 5 mm trocar above and to the left of the umbilicus for a camera port. Under direct vision a 5 mm Nathanson retractor was placed at the subxiphoid area and the left lobe liver elevated with excellent exposure of the stomach. The pylorus was identified and beginning 5 cm from the pylorus the greater curve was dissected dividing the greater curve vasculature with the Harmonic scalpel and the dissection was carried back into the lesser sac. Working proximally along the greater curve the vasculature was sequentially divided with the Harmonic scalpel. Short gastric vessels were individually divided and the fundus was completely mobilized up to the EG junction and the left crus completely dissected free. There was no evidence of  hiatal hernia or upper GI  series was normal. Some further retrogastric adhesions were divided sharply completely freeing the stomach over to the lesser curve. The stomach was somewhat J-shaped. The VisiG tube was passed into the stomach and advanced down to the pylorus and then suction applied along the lesser curve with the tube fixed in place. Pulling the tube back slightly straightened the stomach somewhat out of its J-shaped. I began about 6 cm from the pylorus with an initial firing of the green load 60 mm echelon stapler. A second firing using the gold 60 mm echelon stapler was applied going up past the incisura and staying somewhat away from the tube at this point to not narrow the incisura. Several more firings of the blue load 60 mm echelon stapler were then fired up alongside of the VisiG Tube completing the sleeve just up lateral to the fat pad and making sure there was no redundant posterior fundus. There were several bleeding points along the staple line were controlled with clips. The staple line was completely coated with Tisseel tissue sealant. Dr. Redmond Pulling performed upper endoscopy and there was no evidence of bleeding or leak or stricture. The gastric specimen was removed through the 15 mm port site after dilating it slightly and the fascia was closed with a 0 Vicryl suture placed via the Endo Close. The abdomen was inspected for hemostasis and there was no bleeding, evidence of injury or other problem. CO2 was evacuated and trochars removed and the Nathanson retractor removed under direct vision. Skin incisions were closed with subcuticular Monocryl and Dermabond. Sponge needle and instrument counts were correct.   Estimated Blood Loss:  Minimal         Drains: none  Blood Given: none  Specimens: Greater curvature of stomach        Complications:  * No complications entered in OR log *         Disposition: PACU - hemodynamically stable.         Condition: stable

## 2014-08-19 NOTE — Plan of Care (Signed)
Problem: Consults Goal: Gastric Sleeve Patient Education See Patient Education Module for education specifics. Outcome: Completed/Met Date Met:  08/19/14 Goal: Skin Care Protocol Initiated - if Braden Score 18 or less If consults are not indicated, leave blank or document N/A Outcome: Not Applicable Date Met:  59/10/28 Goal: Nutrition Consult-if indicated Outcome: Completed/Met Date Met:  08/19/14 Goal: Diabetes Guidelines if Diabetic/Glucose > 140 If diabetic or lab glucose is > 140 mg/dl - Initiate Diabetes/Hyperglycemia Guidelines & Document Interventions  Outcome: Completed/Met Date Met:  08/19/14

## 2014-08-20 ENCOUNTER — Inpatient Hospital Stay (HOSPITAL_COMMUNITY): Payer: BC Managed Care – PPO

## 2014-08-20 LAB — HEMOGLOBIN AND HEMATOCRIT, BLOOD
HEMATOCRIT: 29.4 % — AB (ref 36.0–46.0)
HEMOGLOBIN: 9.2 g/dL — AB (ref 12.0–15.0)

## 2014-08-20 LAB — CBC WITH DIFFERENTIAL/PLATELET
Basophils Absolute: 0 10*3/uL (ref 0.0–0.1)
Basophils Relative: 0 % (ref 0–1)
EOS PCT: 0 % (ref 0–5)
Eosinophils Absolute: 0 10*3/uL (ref 0.0–0.7)
HCT: 30.3 % — ABNORMAL LOW (ref 36.0–46.0)
HEMOGLOBIN: 9.3 g/dL — AB (ref 12.0–15.0)
LYMPHS PCT: 13 % (ref 12–46)
Lymphs Abs: 1.1 10*3/uL (ref 0.7–4.0)
MCH: 20.5 pg — AB (ref 26.0–34.0)
MCHC: 30.7 g/dL (ref 30.0–36.0)
MCV: 66.9 fL — ABNORMAL LOW (ref 78.0–100.0)
MONO ABS: 0.4 10*3/uL (ref 0.1–1.0)
Monocytes Relative: 5 % (ref 3–12)
NEUTROS PCT: 82 % — AB (ref 43–77)
Neutro Abs: 6.8 10*3/uL (ref 1.7–7.7)
PLATELETS: 215 10*3/uL (ref 150–400)
RBC: 4.53 MIL/uL (ref 3.87–5.11)
RDW: 16.4 % — ABNORMAL HIGH (ref 11.5–15.5)
WBC: 8.3 10*3/uL (ref 4.0–10.5)

## 2014-08-20 MED ORDER — ACETAMINOPHEN 10 MG/ML IV SOLN
1000.0000 mg | Freq: Once | INTRAVENOUS | Status: AC
  Administered 2014-08-20: 1000 mg via INTRAVENOUS
  Filled 2014-08-20: qty 100

## 2014-08-20 MED ORDER — IOHEXOL 300 MG/ML  SOLN
50.0000 mL | Freq: Once | INTRAMUSCULAR | Status: AC | PRN
  Administered 2014-08-20: 50 mL via ORAL

## 2014-08-20 NOTE — Progress Notes (Signed)
Patient alert and oriented, Post op day 1.  Provided support and encouragement.  Encouraged pulmonary toilet, ambulation and small sips of liquids.  All questions answered.  Will continue to monitor. 

## 2014-08-20 NOTE — Progress Notes (Signed)
Patient ID: SHALEY LEAVENS, female   DOB: 1965-12-14, 48 y.o.   MRN: [id number] 1 Day Post-Op  Subjective: Minimal pain controlled with meds. Some nausea yesterday, better this morning. Had a pretty bad headache last night but also better this morning.  Objective: Vital signs in last 24 hours: Temp:  [97.9 F (36.6 C)-99 F (37.2 C)] 98.7 F (37.1 C) (11/18 1000) Pulse Rate:  [68-94] 68 (11/18 1000) Resp:  [18-28] 18 (11/18 1000) BP: (118-158)/(70-96) 137/79 mmHg (11/18 1000) SpO2:  [99 %-100 %] 100 % (11/18 1000) Last BM Date: 08/18/14  Intake/Output from previous day: 11/17 0701 - 11/18 0700 In: 2763.3 [I.V.:2763.3] Out: 2250 [Urine:2250] Intake/Output this shift: Total I/O In: 400 [I.V.:400] Out: 800 [Urine:800]  General appearance: alert, cooperative and no distress Resp: no wheezing or increased work of breathing GI: normal findings: soft, non-tender Incision/Wound: no evidence of infection  Lab Results:   Recent Labs  08/18/14 1330 08/19/14 1913 08/20/14 0418  WBC 4.8  --  8.3  HGB 9.6* 9.7* 9.3*  HCT 30.7* 31.3* 30.3*  PLT 187  --  215   BMET  Recent Labs  08/18/14 1330  NA 142  K 4.3  CL 105  CO2 22  GLUCOSE 93  BUN 17  CREATININE 0.85  CALCIUM 9.4     Studies/Results: Dg Ugi W/water Sol Cm  08/20/2014   CLINICAL DATA:  Post gastric sleeve  EXAM: WATER SOLUBLE UPPER GI SERIES  TECHNIQUE: Single-column upper GI series was performed using water soluble contrast.  CONTRAST:  32mL OMNIPAQUE IOHEXOL 300 MG/ML  SOLN  COMPARISON:  05/01/2014  FLUOROSCOPY TIME:  39 seconds  FINDINGS: Surgical sutures and clips in the left upper abdomen.  Cholecystectomy clips.  Nonobstructive bowel gas pattern.  Following contrast administration, there is no evidence of leak.  Normal passage of contrast into the proximal small bowel.  IMPRESSION: No evidence of leak.   Electronically Signed   By: Julian Hy M.D.   On: 08/20/2014 09:22     Anti-infectives: Anti-infectives    Start     Dose/Rate Route Frequency Ordered Stop   08/19/14 0747  cefOXitin (MEFOXIN) 2 g in dextrose 5 % 50 mL IVPB     2 g100 mL/hr over 30 Minutes Intravenous On call to O.R. 08/19/14 0747 08/19/14 1120      Assessment/Plan: s/p Procedure(s): LAPAROSCOPIC GASTRIC SLEEVE RESECTION UPPER GI ENDOSCOPY Doing well without apparent complication. Begin postop day #1 diet.   LOS: 1 day    Fayez Sturgell T 08/20/2014

## 2014-08-20 NOTE — Progress Notes (Signed)
CARE MANAGEMENT NOTE 08/20/2014  Patient:  Michelle Huffman, Michelle Huffman   Account Number:  [id number]  Date Initiated:  08/20/2014  Documentation initiated by:  Dessa Phi  Subjective/Objective Assessment:   48 Y/O F ADMITTED W/MORBID OBESITY.     Action/Plan:   FROM HOME.   Anticipated DC Date:  08/21/2014   Anticipated DC Plan:  [place]  CM consult      Choice offered to / List presented to:             Status of service:  In process, will continue to follow Medicare Important Message given?   (If response is "NO", the following Medicare IM given date fields will be blank) Date Medicare IM given:   Medicare IM given by:   Date Additional Medicare IM given:   Additional Medicare IM given by:    Discharge Disposition:    Per UR Regulation:  Reviewed for med. necessity/level of care/duration of stay  If discussed at [place] of Stay Meetings, dates discussed:    Comments:  08/20/14 Jonavon Trieu RN,BSN NCM [phone] POD#1 GASTRIC SLEEVE.NO ANTICIPATED D/C NEEDS.

## 2014-08-20 NOTE — Plan of Care (Signed)
Problem: Food- and Nutrition-Related Knowledge Deficit (NB-1.1) Goal: Nutrition education Formal process to instruct or train a patient/client in a skill or to impart knowledge to help patients/clients voluntarily manage or modify food choices and eating behavior to maintain or improve health. Outcome: Completed/Met Date Met:  08/20/14 Nutrition Education Note  Received consult for diet education per DROP protocol.   11/17 s/p Procedure(s) (LRB): LAPAROSCOPIC GASTRIC SLEEVE RESECTION (N/A) UPPER GI ENDOSCOPY  Discussed 2 week post op diet with pt. Emphasized that liquids must be non carbonated, non caffeinated, and sugar free. Fluid goals discussed. Pt to follow up with outpatient bariatric RD for further diet progression after 2 weeks. Multivitamins and minerals also reviewed. Teach back method used, pt expressed understanding, expect good compliance.   Diet: First 2 Weeks  You will see the nutritionist about two (2) weeks after your surgery. The nutritionist will increase the types of foods you can eat if you are handling liquids well:  If you have severe vomiting or nausea and cannot handle clear liquids lasting longer than 1 day, call your surgeon  Protein Shake  Drink at least 2 ounces of shake 5-6 times per day  Each serving of protein shakes (usually 8 - 12 ounces) should have a minimum of:  15 grams of protein  And no more than 5 grams of carbohydrate  Goal for protein each day:  Men = 80 grams per day  Women = 60 grams per day  Protein powder may be added to fluids such as non-fat milk or Lactaid milk or Soy milk (limit to 35 grams added protein powder per serving)   Hydration  Slowly increase the amount of water and other clear liquids as tolerated (See Acceptable Fluids)  Slowly increase the amount of protein shake as tolerated  Sip fluids slowly and throughout the day  May use sugar substitutes in small amounts (no more than 6 - 8 packets per day; i.e. Splenda)   Fluid  Goal  The first goal is to drink at least 8 ounces of protein shake/drink per day (or as directed by the nutritionist); some examples of protein shakes are [employer], [employer], EAS Edge HP, and Unjury. See handout from pre-op Bariatric Education Class:  Slowly increase the amount of protein shake you drink as tolerated  You may find it easier to slowly sip shakes throughout the day  It is important to get your proteins in first  Your fluid goal is to drink 64 - 100 ounces of fluid daily  It may take a few weeks to build up to this  32 oz (or more) should be clear liquids  And  32 oz (or more) should be full liquids (see below for examples)  Liquids should not contain sugar, caffeine, or carbonation   Clear Liquids:  Water or Sugar-free flavored water (i.e. Fruit H2O, Propel)  Decaffeinated coffee or tea (sugar-free)  Crystal Lite, Wyler's Lite, Minute Maid Lite  Sugar-free Jell-O  Bouillon or broth  Sugar-free Popsicle: *Less than 20 calories each; Limit 1 per day   Full Liquids:  Protein Shakes/Drinks + 2 choices per day of other full liquids  Full liquids must be:  No More Than 12 grams of Carbs per serving  No More Than 3 grams of Fat per serving  Strained low-fat cream soup  Non-Fat milk  Fat-free Lactaid Milk  Sugar-free yogurt (Dannon Lite & Fit, Greek yogurt)     Michelle Bibles, MS, RD, LDN Pager: [phone] After Hours Pager: [phone]

## 2014-08-21 ENCOUNTER — Encounter (HOSPITAL_COMMUNITY): Payer: Self-pay | Admitting: General Surgery

## 2014-08-21 LAB — CBC WITH DIFFERENTIAL/PLATELET
BASOS ABS: 0 10*3/uL (ref 0.0–0.1)
Basophils Relative: 0 % (ref 0–1)
EOS ABS: 0 10*3/uL (ref 0.0–0.7)
Eosinophils Relative: 0 % (ref 0–5)
HCT: 27.4 % — ABNORMAL LOW (ref 36.0–46.0)
Hemoglobin: 8.9 g/dL — ABNORMAL LOW (ref 12.0–15.0)
LYMPHS PCT: 26 % (ref 12–46)
Lymphs Abs: 2.4 10*3/uL (ref 0.7–4.0)
MCH: 21.8 pg — ABNORMAL LOW (ref 26.0–34.0)
MCHC: 32.5 g/dL (ref 30.0–36.0)
MCV: 67 fL — ABNORMAL LOW (ref 78.0–100.0)
MONO ABS: 0.6 10*3/uL (ref 0.1–1.0)
Monocytes Relative: 6 % (ref 3–12)
NEUTROS PCT: 68 % (ref 43–77)
Neutro Abs: 6.4 10*3/uL (ref 1.7–7.7)
PLATELETS: 169 10*3/uL (ref 150–400)
RBC: 4.09 MIL/uL (ref 3.87–5.11)
RDW: 16.7 % — ABNORMAL HIGH (ref 11.5–15.5)
WBC: 9.4 10*3/uL (ref 4.0–10.5)

## 2014-08-21 NOTE — Care Management Note (Signed)
    Page 1 of 1   08/21/2014     4:51:41 PM CARE MANAGEMENT NOTE 08/21/2014  Patient:  Michelle Huffman, Michelle Huffman   Account Number:  [id number]  Date Initiated:  08/20/2014  Documentation initiated by:  Dessa Phi  Subjective/Objective Assessment:   48 Y/O F ADMITTED W/MORBID OBESITY.     Action/Plan:   FROM HOME.   Anticipated DC Date:  08/21/2014   Anticipated DC Plan:  [place]  CM consult      Choice offered to / List presented to:             Status of service:  Completed, signed off Medicare Important Message given?   (If response is "NO", the following Medicare IM given date fields will be blank) Date Medicare IM given:   Medicare IM given by:   Date Additional Medicare IM given:   Additional Medicare IM given by:    Discharge Disposition:  HOME/SELF CARE  Per UR Regulation:  Reviewed for med. necessity/level of care/duration of stay  If discussed at [place] of Stay Meetings, dates discussed:    Comments:  08/20/14 Secilia Apps RN,BSN NCM [phone] POD#1 GASTRIC SLEEVE.NO ANTICIPATED D/C NEEDS.

## 2014-08-21 NOTE — Discharge Summary (Signed)
   Patient ID: Michelle Huffman [id number] 48 y.o. [birth year]  08/19/2014  Discharge date and time: 08/21/2014   Admitting Physician: Excell Seltzer T  Discharge Physician: Excell Seltzer T  Admission Diagnoses: Morbid Obesity  Discharge Diagnoses: Same  Operations: Procedure(s): LAPAROSCOPIC GASTRIC SLEEVE RESECTION UPPER GI ENDOSCOPY  Admission Condition: good  Discharged Condition: good  Indication for Admission: Patient is a 48 year old female with progressive morbid obesity unresponsive to medical management. Following extensive preoperative workup and consultation detailed elsewhere she is electively admitted for laparoscopic sleeve gastrectomy for treatment of her morbid obesity.  Hospital Course: On the morning of admission the patient underwent an uneventful laparoscopic sleeve gastrectomy with intraoperative endoscopy. She tolerated the procedure well. She did have a significant headache and nausea the first night after surgery but this was improved on the first postoperative day. Gastrografin swallow showed no leak or obstruction. She tolerated water well. On the second postoperative day she is feeling well with just mild pain and nausea easily controlled with medications. Tolerating protein shakes. Abdomen is soft and nontender and wounds healing well. White blood count is normal. She has chronic anemia and hemoglobin is 8.9. She is felt ready for discharge.   Disposition: Home  Patient Instructions:    Medication List    STOP taking these medications        ibuprofen 800 MG tablet  Commonly known as:  ADVIL,MOTRIN     traMADol 50 MG tablet  Commonly known as:  ULTRAM      TAKE these medications        albuterol 108 (90 BASE) MCG/ACT inhaler  Commonly known as:  PROVENTIL HFA;VENTOLIN HFA  Inhale into the lungs every 6 (six) hours as needed for wheezing or shortness of breath.     cyclobenzaprine 10 MG tablet  Commonly known as:  FLEXERIL  Take  10 mg by mouth 3 (three) times daily as needed for muscle spasms.     hydrochlorothiazide 25 MG tablet  Commonly known as:  HYDRODIURIL  Take 25 mg by mouth daily.     hydroxychloroquine 200 MG tablet  Commonly known as:  PLAQUENIL  Take 200 mg by mouth 2 (two) times daily.        Activity: activity as tolerated Diet: bariatric protein shakes Wound Care: none needed  Follow-up:  With Dr. Excell Seltzer in 3 weeks.  Signed: Edward Jolly MD, FACS  08/21/2014, 6:56 AM

## 2014-08-21 NOTE — Progress Notes (Signed)
Patient alert and oriented, pain is controlled. Patient is tolerating fluids, advanced to protein shake today, tolerated well.  Reviewed Gastric sleeve discharge instructions with patient and patient is able to articulate understanding.  Provided information on BELT program, Support Group and [facility] outpatient pharmacy. All questions answered, will continue to monitor.  

## 2014-08-21 NOTE — Progress Notes (Signed)
Nurse reviewed discharge instructions with pt.  Pt verbalized understanding of discharge instructions, new medications and follow up appointment.  No concerns at time of discharge.

## 2014-08-21 NOTE — Discharge Instructions (Signed)
Per bariatric nurse ° ° ° °GASTRIC BYPASS/SLEEVE ° Home Care Instructions ° ° These instructions are to help you care for yourself when you go home. ° °Call: If you have any problems. °• Call [phone] and ask for the surgeon on call °• If you need immediate assistance come to the ER at [facility]. Tell the ER staff you are a new post-op gastric bypass or gastric sleeve patient  °Signs and symptoms to report: • Severe  vomiting or nausea °o If you cannot handle clear liquids for longer than 1 day, call your surgeon °• Abdominal pain which does not get better after taking your pain medication °• Fever greater than 100.4°  F and chills °• Heart rate over 100 beats a minute °• Trouble breathing °• Chest pain °• Redness,  swelling, drainage, or foul odor at incision (surgical) sites °• If your incisions open or pull apart °• Swelling or pain in calf (lower leg) °• Diarrhea (Loose bowel movements that happen often), frequent watery, uncontrolled bowel movements °• Constipation, (no bowel movements for 3 days) if this happens: °o Take Milk of Magnesia, 2 tablespoons by mouth, 3 times a day for 2 days if needed °o Stop taking Milk of Magnesia once you have had a bowel movement °o Call your doctor if constipation continues °Or °o Take Miralax  (instead of Milk of Magnesia) following the label instructions °o Stop taking Miralax once you have had a bowel movement °o Call your doctor if constipation continues °• Anything you think is “abnormal for you” °  °Normal side effects after surgery: • Unable to sleep at night or unable to concentrate °• Irritability °• Being tearful (crying) or depressed ° °These are common complaints, possibly related to your anesthesia, stress of surgery, and change in lifestyle, that usually go away a few weeks after surgery. If these feelings continue, call your medical doctor.  °Wound Care: You may have surgical glue, steri-strips, or staples over your incisions after surgery °• Surgical  glue: Looks like clear film over your incisions and will wear off a little at a time °• Steri-strips: Adhesive strips of tape over your incisions. You may notice a yellowish color on skin under the steri-strips. This is used to make the steri-strips stick better. Do not pull the steri-strips off - let them fall off °• Staples: Staples may be removed before you leave the hospital °o If you go home with staples, call [facility] for an appointment with your surgeon’s nurse to have staples removed 10 days after surgery, [phone] °• Showering: You may shower two (2) days after your surgery unless your surgeon tells you differently °o Wash gently around incisions with warm soapy water, rinse well, and gently pat dry °o If you have a drain (tube from your incision), you may need someone to hold this while you shower °o No tub baths until staples are removed and incisions are healed °  °Medications: • Medications should be liquid or crushed if larger than the size of a dime °• Extended release pills (medication that releases a little bit at a time through the  day) should not be crushed °• Depending on the size and number of medications you take, you may need to space (take a few throughout the day)/change the time you take your medications so that you do not over-fill your pouch (smaller stomach) °• Make sure you follow-up with you primary care physician to make medication changes needed during rapid weight loss and   life -style changes °• If you have diabetes, follow up with your doctor that orders your diabetes medication(s) within one week after surgery and check your blood sugar regularly ° °• Do not drive while taking narcotics (pain medications) ° °• Do not take acetaminophen (Tylenol) and Roxicet or Lortab Elixir at the same time since these pain medications contain acetaminophen °  °Diet:  °First 2 Weeks You will see the nutritionist about two (2) weeks after your surgery. The nutritionist will  increase the types of foods you can eat if you are handling liquids well: °• If you have severe vomiting or nausea and cannot handle clear liquids lasting longer than 1 day call your surgeon °Protein Shake °• Drink at least 2 ounces of shake 5-6 times per day °• Each serving of protein shakes (usually 8-12 ounces) should have a minimum of: °o 15 grams of protein °o And no more than 5 grams of carbohydrate °• Goal for protein each day: °o Men = 80 grams per day °o Women = 60 grams per day °  ° • Protein powder may be added to fluids such as non-fat milk or Lactaid milk or Soy milk (limit to 35 grams added protein powder per serving) ° °Hydration °• Slowly increase the amount of water and other clear liquids as tolerated (See Acceptable Fluids) °• Slowly increase the amount of protein shake as tolerated °• Sip fluids slowly and throughout the day °• May use sugar substitutes in small amounts (no more than 6-8 packets per day; i.e. Splenda) ° °Fluid Goal °• The first goal is to drink at least 8 ounces of protein shake/drink per day (or as directed by the nutritionist); some examples of protein shakes are Syntrax Nectar, Adkins Advantage, EAS Edge HP, and Unjury. - See handout from pre-op Bariatric Education Class: °o Slowly increase the amount of protein shake you drink as tolerated °o You may find it easier to slowly sip shakes throughout the day °o It is important to get your proteins in first °• Your fluid goal is to drink 64-100 ounces of fluid daily °o It may take a few weeks to build up to this  °• 32 oz. (or more) should be clear liquids °And °• 32 oz. (or more) should be full liquids (see below for examples) °• Liquids should not contain sugar, caffeine, or carbonation ° °Clear Liquids: °• Water of Sugar-free flavored water (i.e. Fruit H²O, Propel) °• Decaffeinated coffee or tea (sugar-free) °• Crystal lite, Wyler’s Lite, Minute Maid Lite °• Sugar-free Jell-O °• Bouillon or broth °• Sugar-free Popsicle:    -  Less than 20 calories each; Limit 1 per day ° °Full Liquids: °                  Protein Shakes/Drinks + 2 choices per day of other full liquids °• Full liquids must be: °o No More Than 12 grams of Carbs per serving °o No More Than 3 grams of Fat per serving °• Strained low-fat cream soup °• Non-Fat milk °• Fat-free Lactaid Milk °• Sugar-free yogurt (Dannon Lite & Fit, Greek yogurt) ° °  °Vitamins and Minerals • Start 1 day after surgery unless otherwise directed by your surgeon °• 2 Chewable Multivitamin / Multimineral Supplement with iron (i.e. Centrum for Adults) °• Vitamin B-12, 350-500 micrograms sub-lingual (place tablet under the tongue) each day °• Chewable Calcium Citrate with Vitamin D-3 °(Example: 3 Chewable Calcium  Plus 600 with Vitamin D-3) °o Take 500 mg three (3)   times a day for a total of 1500 mg each day °o Do not take all 3 doses of calcium at one time as it may cause constipation, and you can only absorb 500 mg at a time °o Do not mix multivitamins containing iron with calcium supplements;  take 2 hours apart °o Do not substitute Tums (calcium carbonate) for your calcium °• Menstruating women and those at risk for anemia ( a blood disease that causes weakness) may need extra iron °o Talk to your doctor to see if you need more iron °• If you need extra iron: Total daily Iron recommendation (including Vitamins) is 50 to 100 mg Iron/day °• Do not stop taking or change any vitamins or minerals until you talk to your nutritionist or surgeon °• Your nutritionist and/or surgeon must approve all vitamin and mineral supplements °  °Activity and Exercise: It is important to continue walking at home. Limit your physical activity as instructed by your doctor. During this time, use these guidelines: °• Do not lift anything greater than ten  (10) pounds for at least two (2) weeks °• Do not go back to work or drive until your surgeon says you can °• You may have sex when you feel comfortable °o It is VERY  important for female patients to use a reliable birth control method; fertility often increase after surgery °o Do not get pregnant for at least 18 months °• Start exercising as soon as your doctor tells you that you can °o Make sure your doctor approves any physical activity °• Start with a simple walking program °• Walk 5-15 minutes each day, 7 days per week °• Slowly increase until you are walking 30-45 minutes per day °• Consider joining our BELT program. [phone] or email [email or web address] °  °Special Instructions Things to remember: °• Free counseling is available for you and your family through collaboration between [facility] and [employer]. Please call [phone] and leave a message °• Use your CPAP when sleeping if this applies to you °• Consider buying a medical alert bracelet that says you had lap-band surgery °  °  You will likely have your first fill (fluid added to your band) 6 - 8 weeks after surgery °• [facility] has a free Bariatric Surgery Support Group that meets monthly, the 3rd Thursday, 6pm. [employer] [facility]. You can see classes online at [email or web address] °• It is very important to keep all follow up appointments with your surgeon, nutritionist, primary care physician, and behavioral health practitioner °o After the first year, please follow up with your bariatric surgeon and nutritionist at least once a year in order to maintain best weight loss results °      °             [facility]:  [phone] ° °             [facility]: [phone] ° °             Bariatric Nurse Coordinator: [phone]  °Gastric Bypass/Sleeve Home Care Instructions  Rev. 11/2012    ° °                                                    Reviewed and Endorsed °                                                     by [facility] Patient Education Committee, Jan, 2014 ° ° ° ° ° ° ° ° ° °

## 2014-08-22 ENCOUNTER — Telehealth (HOSPITAL_COMMUNITY): Payer: Self-pay

## 2014-08-22 NOTE — Telephone Encounter (Signed)
Made discharge phone call to patient per DROP protocol. Asking the following questions.    1. Do you have someone to care for you now that you are home?yes   2. Are you having pain now that is not relieved by your pain medication?  no 3. Are you able to drink the recommended daily amount of fluids (48 ounces minimum/day) and protein (60-80 grams/day) as prescribed by the dietitian or nutritional counselor?  yes 4. Are you taking the vitamins and minerals as prescribed?  i am getting them today 5. Do you have the "on call" number to contact your surgeon if you have a problem or question?  yes 6. Are your incisions free of redness, swelling or drainage? (If steri strips, address that these can fall off, shower as tolerated) yes 7. Have your bowels moved since your surgery?  If not, are you passing gas?  No/yes 8. Are you up and walking 3-4 times per day?  yes    1. Do you have an appointment made to see your surgeon in the next month?  yes 2. Were you provided your discharge medications before your surgery or before you were discharged from the hospital and are you taking them without problem?  yes 3. Were you provided phone numbers to the clinic/surgeon's office?  yes 4. Did you watch the patient education video module in the (clinic, surgeon's office, etc.) before your surgery? yes 5. Do you have a discharge checklist that was provided to you in the hospital to reference with instructions on how to take care of yourself after surgery?  yes 6. Did you see a dietitian or nutritional counselor while you were in the hospital?  yes 7. Do you have an appointment to see a dietitian or nutritional counselor in the next month?  yes

## 2014-09-02 ENCOUNTER — Encounter: Payer: BC Managed Care – PPO | Attending: General Surgery

## 2014-09-02 DIAGNOSIS — Z713 Dietary counseling and surveillance: Secondary | ICD-10-CM | POA: Insufficient documentation

## 2014-09-02 DIAGNOSIS — Z6837 Body mass index (BMI) 37.0-37.9, adult: Secondary | ICD-10-CM | POA: Diagnosis not present

## 2014-09-02 NOTE — Patient Instructions (Signed)
Patient to follow Phase 3A-Soft, High Protein Diet and follow-up at [facility] in 6 weeks for 2 months post-op nutrition visit for diet advancement. 

## 2014-09-02 NOTE — Progress Notes (Signed)
Bariatric Class:  Appt start time: 1530 end time:  1630.  2 Week Post-Operative Nutrition Class  Patient was seen on 09/02/2014 for Post-Operative Nutrition education at [facility].   Surgery date: 08/19/2014 Surgery type: Gastric sleeve Start weight at [facility]: 234.5 lbs on 05/08/14 Weight today: 219.5  Weight change: 20 lbs  TANITA  BODY COMP RESULTS  07/29/14 09/02/14   BMI (kg/m^2) 41.1 37.7   Fat Mass (lbs) 121 113.5   Fat Free Mass (lbs) 118.5 106.0   Total Body Water (lbs) 87 77.5    The following the learning objectives were met by the patient during this course:  Identifies Phase 3A (Soft, High Proteins) Dietary Goals and will begin from 2 weeks post-operatively to 2 months post-operatively  Identifies appropriate sources of fluids and proteins   States protein recommendations and appropriate sources post-operatively  Identifies the need for appropriate texture modifications, mastication, and bite sizes when consuming solids  Identifies appropriate multivitamin and calcium sources post-operatively  Describes the need for physical activity post-operatively and will follow MD recommendations  States when to call healthcare provider regarding medication questions or post-operative complications  Handouts given during class include:  Phase 3A: Soft, High Protein Diet Handout  Follow-Up Plan: Patient will follow-up at [facility] in 6 weeks for 2 month post-op nutrition visit for diet advancement per MD.

## 2014-10-16 ENCOUNTER — Ambulatory Visit: Payer: BC Managed Care – PPO | Admitting: Dietician

## 2014-10-22 ENCOUNTER — Encounter: Payer: BLUE CROSS/BLUE SHIELD | Attending: General Surgery | Admitting: Dietician

## 2014-10-22 DIAGNOSIS — Z6837 Body mass index (BMI) 37.0-37.9, adult: Secondary | ICD-10-CM | POA: Diagnosis not present

## 2014-10-22 DIAGNOSIS — Z713 Dietary counseling and surveillance: Secondary | ICD-10-CM | POA: Diagnosis not present

## 2014-10-22 NOTE — Patient Instructions (Addendum)
Goals:  Follow Phase 3B: High Protein + Non-Starchy Vegetables  Eat 3-6 small meals/snacks, every 3-5 hrs  Increase lean protein foods to meet 60g goal  Increase fluid intake to 64oz +  Avoid drinking 15 minutes before, during and 30 minutes after eating  Aim for >30 min of physical activity daily  Have 2 protein shakes per day and 3 glasses of water   Limit nuts to 10-15 (pistachios)

## 2014-10-22 NOTE — Progress Notes (Signed)
  Follow-up visit:  8 Weeks Post-Operative Sleeve Gastrectomy Surgery  Medical Nutrition Therapy:  Appt start time: 6286 end time:  0805.  Primary concerns today: Post-operative Bariatric Surgery Nutrition Management. Returns with a 13.5 lb weight loss. Has a good support group. Overall feeling pretty good. Feeling a little overwhelmed about doing the "right" thing.   Surgery date: 08/19/2014 Surgery type: Gastric sleeve Start weight at [facility]: 234.5 lbs on 05/08/14 Weight today: 206.0  Weight change: 13.5 lbs Total weight loss: 28.5 lbs  TANITA  BODY COMP RESULTS  07/29/14 09/02/14 10/22/14   BMI (kg/m^2) 41.1 37.7 35.4   Fat Mass (lbs) 121 113.5 99.5   Fat Free Mass (lbs) 118.5 106.0 106.5   Total Body Water (lbs) 87 77.5 78.0   Preferred Learning Style:   No preference indicated   Learning Readiness:   Ready  24-hr recall: B (630AM): Protein Shake (30 g) Snk (8 AM): 1 egg doesn't finish (4 g) L (PM): 1 oz chicken or fish or beans with zucchini (7 g) Snk (PM): Protein Shake (30 g) or nuts or yogurt (6-30 g) D (PM):  1 oz chicken or fish or beans with zucchini (7 g) Snk (PM):  1 oz chicken or fish or beans with zucchini (7 g)  Fluid intake: 33 oz protein shakes, 2-3 glasses 16 oz of water 65 oz Estimated total protein intake: 111 g  Medications: see list Supplementation: taking  Using straws: No Drinking while eating: No Hair loss: No Carbonated beverages: No N/V/D/C: Constipation - not taking anything yet Dumping syndrome: No  Recent physical activity:  Walks every other day (1-1.5 mile about 35 minutes) and uses arm weights and joined the gym yesterday  Progress Towards Goal(s):  In progress.  Handouts given during visit include:  Phase 3B High Protein + Non Starchy Vegetables    Nutritional Diagnosis:  [place]-3.3 Overweight/obesity related to past poor dietary habits and physical inactivity as evidenced by patient w/ recent Sleeve surgery following dietary  guidelines for continued weight loss.    Intervention:  Nutrition education/diet advancment.  Teaching Method Utilized:  Visual Auditory Hands on  Barriers to learning/adherence to lifestyle change: none  Demonstrated degree of understanding via:  Teach Back   Monitoring/Evaluation:  Dietary intake, exercise, and body weight. Follow up in 1 months for 3 month post-op visit.

## 2014-11-24 ENCOUNTER — Ambulatory Visit: Payer: BLUE CROSS/BLUE SHIELD | Admitting: Dietician

## 2014-11-26 ENCOUNTER — Encounter: Payer: BLUE CROSS/BLUE SHIELD | Attending: General Surgery | Admitting: Dietician

## 2014-11-26 DIAGNOSIS — Z713 Dietary counseling and surveillance: Secondary | ICD-10-CM | POA: Insufficient documentation

## 2014-11-26 DIAGNOSIS — Z6837 Body mass index (BMI) 37.0-37.9, adult: Secondary | ICD-10-CM | POA: Diagnosis not present

## 2014-11-26 NOTE — Progress Notes (Signed)
  Follow-up visit:  3 Months Post-Operative Sleeve Gastrectomy Surgery  Medical Nutrition Therapy:  Appt start time: 0310 end time:  3276.  Primary concerns today: Post-operative Bariatric Surgery Nutrition Management. Returns with a 5.5 lb weight loss. Has been frustrated that weight loss had stalled. Stopped weighing herself so frequently since she was getting obsessed and is instead weight once every 2 weeks.   Surgery date: 08/19/2014 Surgery type: Gastric sleeve Start weight at [facility]: 234.5 lbs on 05/08/14 Weight today: 200.5 lbs  Weight change: 5.5 lbs, 7 lbs fat mass loss Total weight loss: 34 lbs  TANITA  BODY COMP RESULTS  07/29/14 09/02/14 10/22/14 11/26/14   BMI (kg/m^2) 41.1 37.7 35.4 34.4   Fat Mass (lbs) 121 113.5 99.5 92.5   Fat Free Mass (lbs) 118.5 106.0 106.5 108.0   Total Body Water (lbs) 87 77.5 78.0 79.0   Preferred Learning Style:   No preference indicated   Learning Readiness:   Ready  24-hr recall: B (630AM): Protein Shake (30 g) Snk (8 AM): none L (PM): 1 oz egg or lunch meat, fish with spinach, zucchini, broccoli with pistachios (10 g) Snk (PM): SF popsicle D (PM):  1 oz egg or lunch meat, fish with spinach, zucchini, broccoli  (7 g) Snk (PM):  Protein Shake (30 g)  Fluid intake: 22 oz protein shakes, 2-3 glasses 16 oz of water 1 cup skim milk 52-78 oz Estimated total protein intake: 77 g  Medications: see list Supplementation: taking  Using straws: one time Drinking while eating: No Hair loss: No Carbonated beverages: No N/V/D/C: Constipation - taking Miralax Dumping syndrome: No  Recent physical activity:  Walks every other day (40 minutes) and uses arm weights (goes to the gym)  Progress Towards Goal(s):  In progress.   Nutritional Diagnosis:  [place]-3.3 Overweight/obesity related to past poor dietary habits and physical inactivity as evidenced by patient w/ recent Sleeve surgery following dietary guidelines for continued weight loss.     Intervention:  Nutrition education/diet advancment. Goals:  Follow Phase 3B: High Protein + Non-Starchy Vegetables  Eat 3-6 small meals/snacks, every 3-5 hrs  Increase lean protein foods to meet 60g goal  Increase fluid intake to 64oz +  Avoid drinking 15 minutes before, during and 30 minutes after eating  Aim for >30 min of physical activity daily   Weigh yourself no more than every other week  Be proud of your accomplishments - Notice how your clothes are fitting!  Try to relax and not stress!  Try diet cranberry juice (5 calorie) to have with iron  Teaching Method Utilized:  Visual Auditory Hands on  Barriers to learning/adherence to lifestyle change: stress  Demonstrated degree of understanding via:  Teach Back   Monitoring/Evaluation:  Dietary intake, exercise, and body weight. Follow up in 6 weeks for 4.5 month post-op visit.

## 2014-11-26 NOTE — Patient Instructions (Addendum)
Goals:  Follow Phase 3B: High Protein + Non-Starchy Vegetables  Eat 3-6 small meals/snacks, every 3-5 hrs  Increase lean protein foods to meet 60g goal  Increase fluid intake to 64oz +  Avoid drinking 15 minutes before, during and 30 minutes after eating  Aim for >30 min of physical activity daily   Weigh yourself no more than every other week  Be proud of your accomplishments - Notice how your clothes are fitting!  Try to relax and not stress!  Try diet cranberry juice (5 calorie) to have with iron

## 2015-01-07 ENCOUNTER — Encounter: Payer: BLUE CROSS/BLUE SHIELD | Attending: General Surgery | Admitting: Dietician

## 2015-01-07 DIAGNOSIS — Z713 Dietary counseling and surveillance: Secondary | ICD-10-CM | POA: Insufficient documentation

## 2015-01-07 DIAGNOSIS — Z6837 Body mass index (BMI) 37.0-37.9, adult: Secondary | ICD-10-CM | POA: Insufficient documentation

## 2015-01-07 NOTE — Progress Notes (Signed)
  Follow-up visit:  4.5 Months Post-Operative Sleeve Gastrectomy Surgery  Medical Nutrition Therapy:  Appt start time: 0500 end time:  0520.  Primary concerns today: Post-operative Bariatric Surgery Nutrition Management. Returns with a 6 lb weight loss. Feels like things are going ok. Tolerating foods a little better. Not feeling as stressed as before.  Surgery date: 08/19/2014 Surgery type: Gastric sleeve Start weight at [facility]: 234.5 lbs on 05/08/14 Weight today: 195.5 lbs  Weight change: 5 lbs, 6 lbs fat mass loss Total weight loss: 39 lbs Goal weight: 165 lbs  TANITA  BODY COMP RESULTS  07/29/14 09/02/14 10/22/14 11/26/14 01/07/15   BMI (kg/m^2) 41.1 37.7 35.4 34.4 33.6   Fat Mass (lbs) 121 113.5 99.5 92.5 86.5   Fat Free Mass (lbs) 118.5 106.0 106.5 108.0 109.0   Total Body Water (lbs) 87 77.5 78.0 79.0 80.0   Preferred Learning Style:   No preference indicated   Learning Readiness:   Ready  24-hr recall: B (630AM): Protein Shake (30 g) Snk (8 AM): none L (PM): 2 oz egg or lunch meat, fish, chicken with spinach, zucchini, broccoli with pistachios (14 g) Snk (PM): SF popsicle D (PM):   2 oz egg or lunch meat, fish, chicken with spinach, zucchini, broccoli with pistachios (14 g) Snk (PM):  Protein Shake (30 g) once in a while  Fluid intake: 11 oz protein shakes, 2-3 glasses 16 oz of water, 6 oz diet cranberry, 1 cup skim milk 62-76 oz Estimated total protein intake: 58-88 g  Medications: see list Supplementation: taking  Using straws: once in awhile Drinking while eating: No Hair loss: No Carbonated beverages: No N/V/D/C: Constipation - taking Miralax only when needed Dumping syndrome: No  Recent physical activity:  Walks every day (40 minutes) and uses arm weights (goes to the gym)  Progress Towards Goal(s):  In progress.   Nutritional Diagnosis:  [place]-3.3 Overweight/obesity related to past poor dietary habits and physical inactivity as evidenced by patient w/ recent  Sleeve surgery following dietary guidelines for continued weight loss.    Intervention:  Nutrition education/diet advancment. Goals:  Follow Phase 3B: High Protein + Non-Starchy Vegetables  Eat 3-6 small meals/snacks, every 3-5 hrs  Increase lean protein foods to meet 60g goal  Increase fluid intake to 64oz +  Avoid drinking 15 minutes before, during and 30 minutes after eating  Aim for >30 min of physical activity daily, look to increase intensity (run/walk or increase weights)   Continue to be proud of your accomplishments!  Teaching Method Utilized:  Visual Auditory Hands on  Barriers to learning/adherence to lifestyle change: stress  Demonstrated degree of understanding via:  Teach Back   Monitoring/Evaluation:  Dietary intake, exercise, and body weight. Follow up in 6 weeks for 4.5 month post-op visit.

## 2015-01-07 NOTE — Patient Instructions (Addendum)
Goals:  Follow Phase 3B: High Protein + Non-Starchy Vegetables  Eat 3-6 small meals/snacks, every 3-5 hrs  Increase lean protein foods to meet 60g goal  Increase fluid intake to 64oz +  Avoid drinking 15 minutes before, during and 30 minutes after eating  Aim for >30 min of physical activity daily, look to increase intensity (run/walk or increase weights)   Continue to be proud of your accomplishments!  Surgery date: 08/19/2014 Surgery type: Gastric sleeve Start weight at [facility]: 234.5 lbs on 05/08/14 Weight today: 195.5 lbs  Weight change: 5 lbs, 6 lbs fat mass loss Total weight loss: 39 lbs  TANITA  BODY COMP RESULTS  07/29/14 09/02/14 10/22/14 11/26/14 01/07/15   BMI (kg/m^2) 41.1 37.7 35.4 34.4 33.6   Fat Mass (lbs) 121 113.5 99.5 92.5 86.5   Fat Free Mass (lbs) 118.5 106.0 106.5 108.0 109.0   Total Body Water (lbs) 87 77.5 78.0 79.0 80.0

## 2015-02-18 ENCOUNTER — Ambulatory Visit: Payer: BLUE CROSS/BLUE SHIELD | Admitting: Dietician

## 2015-03-31 DIAGNOSIS — D563 Thalassemia minor: Secondary | ICD-10-CM | POA: Insufficient documentation

## 2015-04-01 ENCOUNTER — Ambulatory Visit: Payer: BLUE CROSS/BLUE SHIELD | Admitting: Dietician

## 2015-06-22 ENCOUNTER — Ambulatory Visit (INDEPENDENT_AMBULATORY_CARE_PROVIDER_SITE_OTHER): Payer: BLUE CROSS/BLUE SHIELD | Admitting: Licensed Clinical Social Worker

## 2015-06-22 DIAGNOSIS — F321 Major depressive disorder, single episode, moderate: Secondary | ICD-10-CM | POA: Diagnosis not present

## 2015-06-23 ENCOUNTER — Ambulatory Visit: Payer: BLUE CROSS/BLUE SHIELD | Admitting: Licensed Clinical Social Worker

## 2015-07-07 ENCOUNTER — Ambulatory Visit: Payer: BLUE CROSS/BLUE SHIELD | Admitting: Licensed Clinical Social Worker

## 2015-07-15 ENCOUNTER — Ambulatory Visit: Payer: BLUE CROSS/BLUE SHIELD | Admitting: Licensed Clinical Social Worker

## 2015-07-29 ENCOUNTER — Emergency Department (HOSPITAL_COMMUNITY): Payer: BLUE CROSS/BLUE SHIELD

## 2015-07-29 ENCOUNTER — Encounter (HOSPITAL_COMMUNITY): Payer: Self-pay | Admitting: Emergency Medicine

## 2015-07-29 ENCOUNTER — Emergency Department (HOSPITAL_COMMUNITY)
Admission: EM | Admit: 2015-07-29 | Discharge: 2015-07-30 | Disposition: A | Discharge status: Home / Self Care | Payer: BLUE CROSS/BLUE SHIELD | Attending: Emergency Medicine | Admitting: Emergency Medicine

## 2015-07-29 DIAGNOSIS — Z862 Personal history of diseases of the blood and blood-forming organs and certain disorders involving the immune mechanism: Secondary | ICD-10-CM | POA: Insufficient documentation

## 2015-07-29 DIAGNOSIS — J45909 Unspecified asthma, uncomplicated: Secondary | ICD-10-CM | POA: Insufficient documentation

## 2015-07-29 DIAGNOSIS — M1990 Unspecified osteoarthritis, unspecified site: Secondary | ICD-10-CM | POA: Insufficient documentation

## 2015-07-29 DIAGNOSIS — Z87891 Personal history of nicotine dependence: Secondary | ICD-10-CM | POA: Diagnosis not present

## 2015-07-29 DIAGNOSIS — I10 Essential (primary) hypertension: Secondary | ICD-10-CM | POA: Insufficient documentation

## 2015-07-29 DIAGNOSIS — R52 Pain, unspecified: Secondary | ICD-10-CM | POA: Diagnosis not present

## 2015-07-29 DIAGNOSIS — Z3202 Encounter for pregnancy test, result negative: Secondary | ICD-10-CM | POA: Diagnosis not present

## 2015-07-29 DIAGNOSIS — J020 Streptococcal pharyngitis: Secondary | ICD-10-CM | POA: Diagnosis not present

## 2015-07-29 DIAGNOSIS — R509 Fever, unspecified: Secondary | ICD-10-CM | POA: Diagnosis present

## 2015-07-29 DIAGNOSIS — Z79899 Other long term (current) drug therapy: Secondary | ICD-10-CM | POA: Insufficient documentation

## 2015-07-29 DIAGNOSIS — R5383 Other fatigue: Secondary | ICD-10-CM | POA: Insufficient documentation

## 2015-07-29 DIAGNOSIS — Z8639 Personal history of other endocrine, nutritional and metabolic disease: Secondary | ICD-10-CM | POA: Diagnosis not present

## 2015-07-29 LAB — BASIC METABOLIC PANEL
ANION GAP: 9 (ref 5–15)
BUN: 14 mg/dL (ref 6–20)
CO2: 23 mmol/L (ref 22–32)
Calcium: 9 mg/dL (ref 8.9–10.3)
Chloride: 107 mmol/L (ref 101–111)
Creatinine, Ser: 0.72 mg/dL (ref 0.44–1.00)
GFR calc Af Amer: >60 mL/min (ref >60)
GFR calc non Af Amer: >60 mL/min (ref >60)
Glucose, Bld: 99 mg/dL (ref 65–99)
POTASSIUM: 3.8 mmol/L (ref 3.5–5.1)
Sodium: 139 mmol/L (ref 135–145)

## 2015-07-29 LAB — CBC WITH DIFFERENTIAL/PLATELET
BASOS ABS: 0 10*3/uL (ref 0.0–0.1)
Basophils Relative: 0 %
EOS PCT: 0 %
Eosinophils Absolute: 0 10*3/uL (ref 0.0–0.7)
HEMATOCRIT: 28.4 % — AB (ref 36.0–46.0)
Hemoglobin: 8.9 g/dL — ABNORMAL LOW (ref 12.0–15.0)
LYMPHS PCT: 9 %
Lymphs Abs: 1.2 10*3/uL (ref 0.7–4.0)
MCH: 21.2 pg — ABNORMAL LOW (ref 26.0–34.0)
MCHC: 31.3 g/dL (ref 30.0–36.0)
MCV: 67.6 fL — AB (ref 78.0–100.0)
MONO ABS: 0.8 10*3/uL (ref 0.1–1.0)
MONOS PCT: 6 %
NEUTROS ABS: 11.8 10*3/uL — AB (ref 1.7–7.7)
Neutrophils Relative %: 85 %
PLATELETS: 175 10*3/uL (ref 150–400)
RBC: 4.2 MIL/uL (ref 3.87–5.11)
RDW: 16.3 % — ABNORMAL HIGH (ref 11.5–15.5)
WBC: 13.8 10*3/uL — ABNORMAL HIGH (ref 4.0–10.5)

## 2015-07-29 LAB — I-STAT BETA HCG BLOOD, ED (MC, WL, AP ONLY): I-stat hCG, quantitative: <5 m[IU]/mL (ref <5)

## 2015-07-29 LAB — RAPID STREP SCREEN (MED CTR MEBANE ONLY): STREPTOCOCCUS, GROUP A SCREEN (DIRECT): POSITIVE — AB

## 2015-07-29 LAB — MONONUCLEOSIS SCREEN: Mono Screen: NEGATIVE

## 2015-07-29 MED ORDER — ACETAMINOPHEN 500 MG PO TABS
1000.0000 mg | ORAL_TABLET | Freq: Once | ORAL | Status: AC
  Administered 2015-07-29: 1000 mg via ORAL
  Filled 2015-07-29: qty 2

## 2015-07-29 MED ORDER — ONDANSETRON HCL 4 MG/2ML IJ SOLN
4.0000 mg | Freq: Once | INTRAMUSCULAR | Status: AC
  Administered 2015-07-29: 4 mg via INTRAVENOUS
  Filled 2015-07-29: qty 2

## 2015-07-29 MED ORDER — SODIUM CHLORIDE 0.9 % IV BOLUS (SEPSIS)
1000.0000 mL | Freq: Once | INTRAVENOUS | Status: AC
  Administered 2015-07-29: 1000 mL via INTRAVENOUS

## 2015-07-29 MED ORDER — PENICILLIN G BENZATHINE 1200000 UNIT/2ML IM SUSP
1.2000 10*6.[IU] | Freq: Once | INTRAMUSCULAR | Status: AC
  Administered 2015-07-29: 1.2 10*6.[IU] via INTRAMUSCULAR
  Filled 2015-07-29: qty 2

## 2015-07-29 MED ORDER — MORPHINE SULFATE (PF) 4 MG/ML IV SOLN
4.0000 mg | Freq: Once | INTRAVENOUS | Status: AC
Start: 2015-07-29 — End: 2015-07-29
  Administered 2015-07-29: 4 mg via INTRAVENOUS
  Filled 2015-07-29: qty 1

## 2015-07-29 MED ORDER — AMOXICILLIN 500 MG PO CAPS: 500.0000 mg | ORAL_CAPSULE | Freq: Three times a day (TID) | ORAL | Status: DC

## 2015-07-29 MED ORDER — OXYCODONE-ACETAMINOPHEN 5-325 MG PO TABS: 1.0000 | ORAL_TABLET | ORAL | Status: DC | PRN

## 2015-07-29 NOTE — Discharge Instructions (Signed)

## 2015-07-29 NOTE — ED Notes (Signed)
Pt states she has been feeling bad for the past two days  Pt states she has a fever, congestion, body aches, runny nose, general fatigue  Pt states she took aleve about 6 hrs ago

## 2015-07-29 NOTE — ED Provider Notes (Signed)
CSN: [id number]     Arrival date & time 07/29/15  2009 History   First MD Initiated Contact with Patient 07/29/15 2123     Chief Complaint  Patient presents with  . Fever  . Generalized Body Aches  . Fatigue     (Consider location/radiation/quality/duration/timing/severity/associated sxs/prior Treatment) HPI  Michelle Huffman Is a 49 year old female who presents emergency Department with chief complaint of flulike symptoms. She states she has been feeling poorly for the past 2 days. This morning she woke up with a sore throat, severe myalgias, fever, fatigue. She denies any nausea. She has associated nasal congestion. The patient has a past medical history of lupus and takes Plaquenil.  Denies DOE, SOB, chest tightness or pressure, radiation to left arm, jaw or back, or diaphoresis. Denies dysuria, flank pain, suprapubic pain, frequency, urgency, or hematuria. Denies, light headedness, weakness, visual disturbances. Denies abdominal pain, nausea, vomiting, diarrhea or constipation.    Past Medical History  Diagnosis Date  . Blood transfusion   . Chest pain   . Anemia   . Hypertension   . Autoimmune disease ([place])   . Hypothyroidism   . Lupus ([place])   . Arthritis   . Asthma   . Complication of anesthesia   . PONV (postoperative nausea and vomiting)    Past Surgical History  Procedure Laterality Date  . Knee surgery  2010    laproscopic right knee  . Abdominal hysterectomy  2009  . Cholecystectomy  07/21/11  . Laparoscopic gastric sleeve resection N/A 08/19/2014    Procedure: LAPAROSCOPIC GASTRIC SLEEVE RESECTION;  Surgeon: Excell Seltzer, MD;  Location: [facility];  Service: General;  Laterality: N/A;  . Upper gi endoscopy  08/19/2014    Procedure: UPPER GI ENDOSCOPY;  Surgeon: Excell Seltzer, MD;  Location: [facility];  Service: General;;   Family History  Problem Relation Age of Onset  . Asthma Other   . Hypertension Other   . Hyperlipidemia Other    Social History   Substance Use Topics  . Smoking status: Former Research scientist (life sciences)  . Smokeless tobacco: Never Used  . Alcohol Use: Yes     Comment: socially   OB History    No data available     Review of Systems   Ten systems reviewed and are negative for acute change, except as noted in the HPI.   Allergies  Sulfa antibiotics  Home Medications   Prior to Admission medications   Medication Sig Start Date End Date Taking? Authorizing Provider  albuterol (PROVENTIL HFA;VENTOLIN HFA) 108 (90 BASE) MCG/ACT inhaler Inhale into the lungs every 6 (six) hours as needed for wheezing or shortness of breath.   Yes Historical Provider, MD  hydroxychloroquine (PLAQUENIL) 200 MG tablet Take 200 mg by mouth 2 (two) times daily.  01/28/14  Yes Historical Provider, MD  naproxen sodium (ANAPROX) 220 MG tablet Take 220 mg by mouth 2 (two) times daily as needed (pain).   Yes Historical Provider, MD  traMADol (ULTRAM) 50 MG tablet Take 50 mg by mouth every 8 (eight) hours as needed for moderate pain.  07/24/15  Yes Historical Provider, MD  amoxicillin (AMOXIL) 500 MG capsule Take 1 capsule (500 mg total) by mouth 3 (three) times daily. 07/29/15   Margarita Mail, PA-C  oxyCODONE-acetaminophen (PERCOCET) 5-325 MG tablet Take 1-2 tablets by mouth every 4 (four) hours as needed. 07/29/15   Matteson Blue, PA-C   BP 158/88 mmHg  Pulse 80  Temp(Src) 101.1 F (38.4 C) (Oral)  Resp 17  SpO2 100% Physical Exam  Constitutional: She is oriented to person, place, and time. She appears well-developed and well-nourished. No distress.  HENT:  Head: Normocephalic and atraumatic.  Mild erythema of pharynx  Eyes: Conjunctivae are normal. No scleral icterus.  Neck: Normal range of motion.  Cardiovascular: Normal rate, regular rhythm and normal heart sounds.  Exam reveals no gallop and no friction rub.   No murmur heard. Pulmonary/Chest: Effort normal and breath sounds normal. No respiratory distress.  Abdominal: Soft. Bowel sounds are  normal. She exhibits no distension and no mass. There is no tenderness. There is no guarding.  Lymphadenopathy:    She has cervical adenopathy (tender and swollen).  Neurological: She is alert and oriented to person, place, and time.  Skin: Skin is warm and dry. She is not diaphoretic.  Nursing note and vitals reviewed.   ED Course  Procedures (including critical care time) Labs Review Labs Reviewed  RAPID STREP SCREEN (NOT AT [facility]) - Abnormal; Notable for the following:    Streptococcus, Group A Screen (Direct) POSITIVE (*)    All other components within normal limits  CBC WITH DIFFERENTIAL/PLATELET - Abnormal; Notable for the following:    WBC 13.8 (*)    Hemoglobin 8.9 (*)    HCT 28.4 (*)    MCV 67.6 (*)    MCH 21.2 (*)    RDW 16.3 (*)    Neutro Abs 11.8 (*)    All other components within normal limits  URINALYSIS, ROUTINE W REFLEX MICROSCOPIC (NOT AT [facility]) - Abnormal; Notable for the following:    APPearance CLOUDY (*)    Leukocytes, UA TRACE (*)    All other components within normal limits  URINE MICROSCOPIC-ADD ON - Abnormal; Notable for the following:    Squamous Epithelial / LPF MANY (*)    Bacteria, UA FEW (*)    All other components within normal limits  BASIC METABOLIC PANEL  MONONUCLEOSIS SCREEN  I-STAT BETA HCG BLOOD, ED ([facility], [facility], [facility] ONLY)    Imaging Review Dg Chest 2 View  07/29/2015  CLINICAL DATA:  49 year old female with history of cough, fever and flu-like symptoms. EXAM: CHEST  2 VIEW COMPARISON:  Chest x-ray 05/01/2014. FINDINGS: Lung volumes are normal. No consolidative airspace disease. No pleural effusions. No pneumothorax. No pulmonary nodule or mass noted. Pulmonary vasculature and the cardiomediastinal silhouette are within normal limits. IMPRESSION: No radiographic evidence of acute cardiopulmonary disease. Electronically Signed   By: Vinnie Langton M.D.   On: 07/29/2015 22:37   I have personally reviewed and evaluated these images and lab results  as part of my medical decision-making.   EKG Interpretation None      MDM   Final diagnoses:  Strep pharyngitis    Pt febrile, cervical lymphadenopathy, & dysphagia; diagnosis of strep. Treated in the Ed with  Pain medication and PCN IM.  Pt appears mildly dehydrated, discussed importance of water rehydration. Presentation non concerning for PTA or infxn spread to soft tissue. No trismus or uvula deviation. Specific return precautions discussed. Pt able to drink water in ED without difficulty with intact air way. Recommended PCP follow up.      Margarita Mail, PA-C 07/31/15 1761  Evelina Bucy, MD 08/01/15 [phone]

## 2015-07-29 NOTE — ED Notes (Signed)
Patient given crackers and peanut butter per request

## 2015-07-30 LAB — URINE MICROSCOPIC-ADD ON

## 2015-07-30 LAB — URINALYSIS, ROUTINE W REFLEX MICROSCOPIC
BILIRUBIN URINE: NEGATIVE
GLUCOSE, UA: NEGATIVE mg/dL
HGB URINE DIPSTICK: NEGATIVE
Ketones, ur: NEGATIVE mg/dL
Nitrite: NEGATIVE
PROTEIN: NEGATIVE mg/dL
Specific Gravity, Urine: 1.014 (ref 1.005–1.030)
Urobilinogen, UA: 1 mg/dL (ref 0.0–1.0)
pH: 7 (ref 5.0–8.0)

## 2015-07-30 MED ORDER — ONDANSETRON 8 MG PO TBDP
8.0000 mg | ORAL_TABLET | Freq: Once | ORAL | Status: AC
  Administered 2015-07-30: 8 mg via ORAL
  Filled 2015-07-30: qty 1

## 2015-11-17 ENCOUNTER — Encounter (HOSPITAL_COMMUNITY): Payer: Self-pay | Admitting: Psychiatry

## 2015-11-17 ENCOUNTER — Ambulatory Visit (INDEPENDENT_AMBULATORY_CARE_PROVIDER_SITE_OTHER): Payer: BLUE CROSS/BLUE SHIELD | Admitting: Psychiatry

## 2015-11-17 VITALS — BP 130/82 | HR 78 | Ht 64.0 in | Wt 203.6 lb

## 2015-11-17 DIAGNOSIS — G4700 Insomnia, unspecified: Secondary | ICD-10-CM | POA: Diagnosis not present

## 2015-11-17 DIAGNOSIS — F411 Generalized anxiety disorder: Secondary | ICD-10-CM | POA: Insufficient documentation

## 2015-11-17 DIAGNOSIS — F332 Major depressive disorder, recurrent severe without psychotic features: Secondary | ICD-10-CM

## 2015-11-17 MED ORDER — HYDROXYZINE PAMOATE 25 MG PO CAPS: ORAL_CAPSULE | ORAL | Status: DC

## 2015-11-17 MED ORDER — PAROXETINE HCL 20 MG PO TABS: 20.0000 mg | ORAL_TABLET | Freq: Every day | ORAL | Status: DC

## 2015-11-17 NOTE — Progress Notes (Signed)
Psychiatric Initial Adult Assessment   Patient Identification: Michelle Huffman MRN:  [id number] Date of Evaluation:  11/17/2015 Referral Source: PCP Chief Complaint:   Chief Complaint    Depression     Visit Diagnosis:    ICD-9-CM ICD-10-CM   1. Severe episode of recurrent major depressive disorder, without psychotic features ([place]) 296.33 F33.2   2. GAD (generalized anxiety disorder) 300.02 F41.1   3. Insomnia 780.52 G47.00    Diagnosis:   Patient Active Problem List   Diagnosis Date Noted  . Insomnia [G47.00] 11/17/2015  . GAD (generalized anxiety disorder) [F41.1] 11/17/2015  . Severe episode of recurrent major depressive disorder, without psychotic features ([place]) [F33.2] 11/17/2015  . Morbid obesity ([place]) [E66.01] 03/27/2014  . Right shoulder pain [M25.511] 03/06/2014  . Biliary dyskinesia [K82.8] 07/11/2011  . PAROTIDITIS [K11.20] 10/11/2007  . PITUITARY ADENOMA, BENIGN [D35.2, D35.3] 07/25/2007  . HYPOTHYROIDISM [E03.9] 05/03/2007  . ANEMIA-IRON DEFICIENCY [D50.9] 05/03/2007   History of Present Illness:  Pt states she has a lot going on. States mentally she is "gone, not here". States she is not able to think clearly. Her mood is affecting her work and personal life.   She is forgetful and has poor concentration. Pt is easily irritated with lost frustration tolerance. Pt has been depressed for 5 yrs. Others are telling herself that she is not herself and needs to get help. Pt is depressed on a daily basis and level is 8/10. Pt spends all her time in bed watching tv when not at work. Reports anhedonia, isolation, crying spells, low motivation, worthlessness and hopelessness. Reports random, passive SI without plan or intent. Reports at times when driving she can visualize herself hitting a wall or driving off the wall but never acts on it. Denies HI.   Sleep is poor and pt has difficulty falling asleep. Pt is getting about 3-4 hrs/night.   Appetite is poor and she makes  herself eat.   Energy is low.   Pt reports rare stress induced panic attacks. States it happens when overwhelmed. States they last for 15-20 min and pt treats by distracting herself.   Pt has anxiety and worries most of her waking hours. Pt states the racing thoughts keep her up at night, fatigue and headaches. She worries about her health, parents, daughter, finances.   Pt reports PCP gave her FMLA for 1 week in Feb 2017.  Elements:  Severity:  severe. Timing:  ongoing. Duration:  5 yrs. Context:  quality of life. Associated Signs/Symptoms: Depression Symptoms:  depressed mood, anhedonia, insomnia, fatigue, feelings of worthlessness/guilt, difficulty concentrating, hopelessness, recurrent thoughts of death, loss of energy/fatigue, decreased appetite, (Hypo) Manic Symptoms:  Irritable Mood,  Denies manic and hypomanic symptoms including periods of decreased need for sleep, increased energy, mood lability, impulsivity, FOI, and excessive spending.  Anxiety Symptoms:  Excessive Worry, Panic Symptoms, denies symptoms of OCD, social anxiety and phobias  Psychotic Symptoms:  negative PTSD Symptoms: Negative  Past Medical History:  Past Medical History  Diagnosis Date  . Blood transfusion   . Chest pain   . Anemia   . Hypertension   . Autoimmune disease ([place])   . Hypothyroidism   . Lupus ([place])   . Arthritis   . Asthma   . Complication of anesthesia   . PONV (postoperative nausea and vomiting)   . Alpha thalassemia major [facility])     Past Surgical History  Procedure Laterality Date  . Knee surgery  2010    laproscopic right knee  .  Abdominal hysterectomy  2009  . Cholecystectomy  07/21/11  . Laparoscopic gastric sleeve resection N/A 08/19/2014    Procedure: LAPAROSCOPIC GASTRIC SLEEVE RESECTION;  Surgeon: Excell Seltzer, MD;  Location: [facility];  Service: General;  Laterality: N/A;  . Upper gi endoscopy  08/19/2014    Procedure: UPPER GI ENDOSCOPY;  Surgeon: Excell Seltzer, MD;  Location: [facility];  Service: General;;   Past Psych Hx: Dx: denies Meds: denies Previous psychiatrist/therapist: denies Hospitalizations: denies SIB: denies Suicide attempts: denies Hx of violent behavior towards others: denies Current access to weapons: denies Hx of abuse: denies Military Hx: denies   Family History:  Family History  Problem Relation Age of Onset  . Asthma Other   . Hypertension Other   . Hyperlipidemia Other   . Depression Mother   . Alcohol abuse Neg Hx   . Anxiety disorder Neg Hx   . Bipolar disorder Neg Hx   . Dementia Neg Hx   . Drug abuse Neg Hx   . Schizophrenia Neg Hx    Social History:   Social History   Social History  . Marital Status: Single    Spouse Name: N/A  . Number of Children: 1  . Years of Education: 13   Occupational History  .     Social History Main Topics  . Smoking status: Former Smoker    Quit date: 11/17/2007  . Smokeless tobacco: Never Used  . Alcohol Use: Yes     Comment: socially- a few times a year  . Drug Use: No  . Sexual Activity: Not Asked   Other Topics Concern  . None   Social History Narrative   Pt lives in [place] with her 27yo daughter. Born and raised in [place] by her parents. Pt has 2 bro and pt is the youngest. States it was a good childhood. Pt has completed one year of college. Pt works at [employer] in [employer] for last 3 yrs. Pt has never been married.    Musculoskeletal: Strength & Muscle Tone: within normal limits Gait & Station: normal Patient leans: straight  Psychiatric Specialty Exam: HPI  Review of Systems  Constitutional: Negative for fever and chills.  HENT: Positive for congestion and sore throat. Negative for ear pain.   Eyes: Negative for blurred vision, double vision and pain.  Respiratory: Positive for cough and wheezing. Negative for shortness of breath.   Cardiovascular: Negative for chest pain, palpitations and leg swelling.   Gastrointestinal: Negative for heartburn, nausea, vomiting and abdominal pain.  Musculoskeletal: Positive for myalgias and joint pain. Negative for back pain and neck pain.  Skin: Negative for itching and rash.  Neurological: Positive for weakness. Negative for dizziness, tremors, seizures, loss of consciousness and headaches.  Psychiatric/Behavioral: Positive for depression, suicidal ideas and memory loss. Negative for hallucinations and substance abuse. The patient is nervous/anxious and has insomnia.     Blood pressure 130/82, pulse 78, height 5\' 4"  (1.626 m), weight 203 lb 9.6 oz (92.352 kg).Body mass index is 34.93 kg/(m^2).  General Appearance: Fairly Groomed  Eye Contact:  Good  Speech:  Clear and Coherent and Normal Rate  Volume:  Normal  Mood:  Anxious and Depressed  Affect:  Congruent  Thought Process:  Goal Directed  Orientation:  Full (Time, Place, and Person)  Thought Content:  Negative  Suicidal Thoughts:  Yes.  without intent/plan  Homicidal Thoughts:  No  Memory:  Immediate;   Good Recent;   Good Remote;  Good  Judgement:  Fair  Insight:  Fair  Psychomotor Activity:  Normal  Concentration:  Fair  Recall:  Good  Fund of Knowledge:Good  Language: Good  Akathisia:  No  Handed:  Right  AIMS (if indicated):  n/a  Assets:  Communication Skills Desire for Improvement Financial Resources/Insurance Housing Talents/Skills Transportation Vocational/Educational  ADL's:  Intact  Cognition: WNL  Sleep:  poor   Is the patient at risk to self?  Yes.   Has the patient been a risk to self in the past 6 months?  No. Has the patient been a risk to self within the distant past?  No. Is the patient a risk to others?  No. Has the patient been a risk to others in the past 6 months?  No. Has the patient been a risk to others within the distant past?  No.  Allergies:   Allergies  Allergen Reactions  . Sulfa Antibiotics Shortness Of Breath and Swelling   Current  Medications: Current Outpatient Prescriptions  Medication Sig Dispense Refill  . albuterol (PROVENTIL HFA;VENTOLIN HFA) 108 (90 BASE) MCG/ACT inhaler Inhale into the lungs every 6 (six) hours as needed for wheezing or shortness of breath.    [name] amoxicillin (AMOXIL) 500 MG capsule Take 1 capsule (500 mg total) by mouth 3 (three) times daily. 30 capsule 0  . hydroxychloroquine (PLAQUENIL) 200 MG tablet Take 200 mg by mouth 2 (two) times daily.     . naproxen sodium (ANAPROX) 220 MG tablet Take 220 mg by mouth 2 (two) times daily as needed (pain). Reported on 11/17/2015    . oxyCODONE-acetaminophen (PERCOCET) 5-325 MG tablet Take 1-2 tablets by mouth every 4 (four) hours as needed. (Patient not taking: Reported on 11/17/2015) 20 tablet 0  . traMADol (ULTRAM) 50 MG tablet Take 50 mg by mouth every 8 (eight) hours as needed for moderate pain. Reported on 11/17/2015     No current facility-administered medications for this visit.    Previous Psychotropic Medications: no  Substance Abuse History in the last 12 months:  No.  Consequences of Substance Abuse: Negative  Medical Decision Making:  Review of Psycho-Social Stressors (1), Review or order clinical lab tests (1), Established Problem, Worsening (2), Review of Medication Regimen & Side Effects (2) and Review of New Medication or Change in Dosage (2)  Treatment Plan Summary: Medication management and Plan see below   Assessment: MDD-recurrent, severe without psychotic features; GAD; Insomnia   Medication management with supportive therapy. Risks/benefits and SE of the medication discussed. Pt verbalized understanding and verbal consent obtained for treatment.  Affirm with the patient that the medications are taken as ordered. Patient expressed understanding of how their medications were to be used.  Meds: Start trial of Paxil 20mg  po qD for mood and anxiety Start trial of Vistaril 25-50mg  po qHS prn insomnia   Labs:Pt will send in copy of  recent labs   Therapy: brief supportive therapy provided. Discussed psychosocial stressors in detail.   Encouraged pt to develop daily routine and work on daily goal setting as a way to improve mood symptoms.  Reviewed sleep hygiene in detail  Consultations:  Referred to  therapy  Pt denies SI and is at an acute low risk for suicide. Patient told to call clinic if any problems occur. Patient advised to go to ER if they should develop SI/HI, side effects, or if symptoms worsen. Has crisis numbers to call if needed. Pt verbalized understanding.  F/up in 2 months or sooner if  needed    Charlcie Cradle 2/14/20171:20 PM

## 2015-12-10 ENCOUNTER — Ambulatory Visit (HOSPITAL_COMMUNITY): Payer: Self-pay | Admitting: Psychiatry

## 2015-12-28 ENCOUNTER — Ambulatory Visit (HOSPITAL_COMMUNITY): Payer: Self-pay | Admitting: Clinical

## 2016-01-14 DIAGNOSIS — E611 Iron deficiency: Secondary | ICD-10-CM | POA: Insufficient documentation

## 2016-01-19 ENCOUNTER — Ambulatory Visit (HOSPITAL_COMMUNITY): Payer: Self-pay | Admitting: Psychiatry

## 2016-01-20 ENCOUNTER — Emergency Department (HOSPITAL_COMMUNITY)
Admission: EM | Admit: 2016-01-20 | Discharge: 2016-01-20 | Disposition: A | Discharge status: Home / Self Care | Payer: BLUE CROSS/BLUE SHIELD | Attending: Emergency Medicine | Admitting: Emergency Medicine

## 2016-01-20 ENCOUNTER — Emergency Department (HOSPITAL_COMMUNITY): Payer: BLUE CROSS/BLUE SHIELD

## 2016-01-20 ENCOUNTER — Encounter (HOSPITAL_COMMUNITY): Payer: Self-pay | Admitting: *Deleted

## 2016-01-20 DIAGNOSIS — Z8639 Personal history of other endocrine, nutritional and metabolic disease: Secondary | ICD-10-CM | POA: Diagnosis not present

## 2016-01-20 DIAGNOSIS — I10 Essential (primary) hypertension: Secondary | ICD-10-CM | POA: Diagnosis not present

## 2016-01-20 DIAGNOSIS — Z9884 Bariatric surgery status: Secondary | ICD-10-CM | POA: Diagnosis not present

## 2016-01-20 DIAGNOSIS — M1990 Unspecified osteoarthritis, unspecified site: Secondary | ICD-10-CM | POA: Diagnosis not present

## 2016-01-20 DIAGNOSIS — Z79899 Other long term (current) drug therapy: Secondary | ICD-10-CM | POA: Insufficient documentation

## 2016-01-20 DIAGNOSIS — J020 Streptococcal pharyngitis: Secondary | ICD-10-CM

## 2016-01-20 DIAGNOSIS — J45909 Unspecified asthma, uncomplicated: Secondary | ICD-10-CM | POA: Insufficient documentation

## 2016-01-20 DIAGNOSIS — M329 Systemic lupus erythematosus, unspecified: Secondary | ICD-10-CM | POA: Diagnosis not present

## 2016-01-20 DIAGNOSIS — Z862 Personal history of diseases of the blood and blood-forming organs and certain disorders involving the immune mechanism: Secondary | ICD-10-CM | POA: Insufficient documentation

## 2016-01-20 DIAGNOSIS — R509 Fever, unspecified: Secondary | ICD-10-CM | POA: Diagnosis present

## 2016-01-20 LAB — CBC WITH DIFFERENTIAL/PLATELET
BASOS ABS: 0 10*3/uL (ref 0.0–0.1)
Basophils Relative: 0 %
EOS ABS: 0.2 10*3/uL (ref 0.0–0.7)
Eosinophils Relative: 4 %
HCT: 27.3 % — ABNORMAL LOW (ref 36.0–46.0)
HEMOGLOBIN: 8.5 g/dL — AB (ref 12.0–15.0)
LYMPHS PCT: 21 %
Lymphs Abs: 1 10*3/uL (ref 0.7–4.0)
MCH: 20.9 pg — AB (ref 26.0–34.0)
MCHC: 31.1 g/dL (ref 30.0–36.0)
MCV: 67.2 fL — ABNORMAL LOW (ref 78.0–100.0)
MONO ABS: 0.2 10*3/uL (ref 0.1–1.0)
Monocytes Relative: 5 %
NEUTROS PCT: 70 %
Neutro Abs: 3.2 10*3/uL (ref 1.7–7.7)
PLATELETS: 131 10*3/uL — AB (ref 150–400)
RBC: 4.06 MIL/uL (ref 3.87–5.11)
RDW: 16.1 % — ABNORMAL HIGH (ref 11.5–15.5)
WBC: 4.6 10*3/uL (ref 4.0–10.5)

## 2016-01-20 LAB — COMPREHENSIVE METABOLIC PANEL
ALT: 12 U/L — ABNORMAL LOW (ref 14–54)
ANION GAP: 8 (ref 5–15)
AST: 22 U/L (ref 15–41)
Albumin: 3.7 g/dL (ref 3.5–5.0)
Alkaline Phosphatase: 60 U/L (ref 38–126)
BILIRUBIN TOTAL: 0.3 mg/dL (ref 0.3–1.2)
BUN: 11 mg/dL (ref 6–20)
CHLORIDE: 106 mmol/L (ref 101–111)
CO2: 23 mmol/L (ref 22–32)
Calcium: 8.7 mg/dL — ABNORMAL LOW (ref 8.9–10.3)
Creatinine, Ser: 0.95 mg/dL (ref 0.44–1.00)
Glucose, Bld: 111 mg/dL — ABNORMAL HIGH (ref 65–99)
POTASSIUM: 3.8 mmol/L (ref 3.5–5.1)
Sodium: 137 mmol/L (ref 135–145)
TOTAL PROTEIN: 8.3 g/dL — AB (ref 6.5–8.1)

## 2016-01-20 LAB — I-STAT CG4 LACTIC ACID, ED
LACTIC ACID, VENOUS: 1.19 mmol/L (ref 0.5–2.0)
LACTIC ACID, VENOUS: 0.63 mmol/L (ref 0.5–2.0)

## 2016-01-20 LAB — RAPID STREP SCREEN (MED CTR MEBANE ONLY): STREPTOCOCCUS, GROUP A SCREEN (DIRECT): POSITIVE — AB

## 2016-01-20 MED ORDER — ACETAMINOPHEN 325 MG PO TABS
650.0000 mg | ORAL_TABLET | Freq: Once | ORAL | Status: AC | PRN
  Administered 2016-01-20: 650 mg via ORAL
  Filled 2016-01-20: qty 2

## 2016-01-20 MED ORDER — OXYCODONE-ACETAMINOPHEN 5-325 MG PO TABS: 1.0000 | ORAL_TABLET | Freq: Four times a day (QID) | ORAL | Status: DC | PRN

## 2016-01-20 MED ORDER — ONDANSETRON 8 MG PO TBDP: 8.0000 mg | ORAL_TABLET | Freq: Three times a day (TID) | ORAL | Status: DC | PRN

## 2016-01-20 MED ORDER — PENICILLIN G BENZATHINE 1200000 UNIT/2ML IM SUSP
1.2000 10*6.[IU] | Freq: Once | INTRAMUSCULAR | Status: AC
  Administered 2016-01-20: 1.2 10*6.[IU] via INTRAMUSCULAR
  Filled 2016-01-20: qty 2

## 2016-01-20 MED ORDER — IBUPROFEN 200 MG PO TABS
400.0000 mg | ORAL_TABLET | Freq: Once | ORAL | Status: AC
  Administered 2016-01-20: 400 mg via ORAL
  Filled 2016-01-20: qty 2

## 2016-01-20 MED ORDER — FENTANYL CITRATE (PF) 100 MCG/2ML IJ SOLN
100.0000 ug | Freq: Once | INTRAMUSCULAR | Status: AC
Start: 2016-01-20 — End: 2016-01-20
  Administered 2016-01-20: 100 ug via INTRAVENOUS
  Filled 2016-01-20: qty 2

## 2016-01-20 NOTE — ED Provider Notes (Signed)
CSN: [id number]     Arrival date & time 01/20/16  0214 History   First MD Initiated Contact with Patient 01/20/16 [phone]     Chief Complaint  Patient presents with  . Fever     (Consider location/radiation/quality/duration/timing/severity/associated sxs/prior Treatment) HPI  This is a 50 year old female with a two-day history of fever. She has been treating the fever with ibuprofen but the fevers keep returning. This morning she checked her temperature and found it to be 104.7. On arrival here her temperature was 102.9 and she was given Tylenol. She complains of generalized body pain, malaise, mild sore throat, occasional cough and nausea. She denies vomiting or diarrhea.  Past Medical History  Diagnosis Date  . Blood transfusion   . Chest pain   . Anemia   . Hypertension   . Autoimmune disease ([place])   . Hypothyroidism   . Lupus ([place])   . Arthritis   . Asthma   . Complication of anesthesia   . PONV (postoperative nausea and vomiting)   . Alpha thalassemia major [facility])    Past Surgical History  Procedure Laterality Date  . Knee surgery  2010    laproscopic right knee  . Abdominal hysterectomy  2009  . Cholecystectomy  07/21/11  . Laparoscopic gastric sleeve resection N/A 08/19/2014    Procedure: LAPAROSCOPIC GASTRIC SLEEVE RESECTION;  Surgeon: Excell Seltzer, MD;  Location: [facility];  Service: General;  Laterality: N/A;  . Upper gi endoscopy  08/19/2014    Procedure: UPPER GI ENDOSCOPY;  Surgeon: Excell Seltzer, MD;  Location: [facility];  Service: General;;   Family History  Problem Relation Age of Onset  . Asthma Other   . Hypertension Other   . Hyperlipidemia Other   . Depression Mother   . Alcohol abuse Neg Hx   . Anxiety disorder Neg Hx   . Bipolar disorder Neg Hx   . Dementia Neg Hx   . Drug abuse Neg Hx   . Schizophrenia Neg Hx    Social History  Substance Use Topics  . Smoking status: Former Smoker    Quit date: 11/17/2007  . Smokeless tobacco: Never Used   . Alcohol Use: Yes     Comment: socially- a few times a year   OB History    No data available     Review of Systems  All other systems reviewed and are negative.     Allergies  Sulfa antibiotics  Home Medications   Prior to Admission medications   Medication Sig Start Date End Date Taking? Authorizing Provider  hydroxychloroquine (PLAQUENIL) 200 MG tablet Take 200 mg by mouth 2 (two) times daily.  01/28/14  Yes Historical Provider, MD  hydrOXYzine (VISTARIL) 25 MG capsule Take 25-50mg  po qHS prn insomnia Patient taking differently: Take 25-50 mg by mouth at bedtime as needed (insomnia).  11/17/15  Yes Charlcie Cradle, MD  naproxen sodium (ANAPROX) 220 MG tablet Take 220 mg by mouth 2 (two) times daily as needed (pain). Reported on 11/17/2015   Yes Historical Provider, MD  oxyCODONE-acetaminophen (PERCOCET) 5-325 MG tablet Take 1-2 tablets by mouth every 4 (four) hours as needed. Patient taking differently: Take 1-2 tablets by mouth every 4 (four) hours as needed for moderate pain or severe pain.  07/29/15  Yes Margarita Mail, PA-C  PARoxetine (PAXIL) 20 MG tablet Take 1 tablet (20 mg total) by mouth daily. 11/17/15 11/16/16 Yes Charlcie Cradle, MD  traMADol (ULTRAM) 50 MG tablet Take 50 mg by mouth every 8 (eight) hours  as needed for moderate pain. Reported on 11/17/2015 07/24/15  Yes Historical Provider, MD  amoxicillin (AMOXIL) 500 MG capsule Take 1 capsule (500 mg total) by mouth 3 (three) times daily. Patient not taking: Reported on 01/20/2016 07/29/15   Margarita Mail, PA-C   BP 125/85 mmHg  Pulse 81  Temp(Src) 103.1 F (39.5 C) (Oral)  Resp 14  Ht 5\' 4"  (1.626 m)  Wt 198 lb (89.812 kg)  BMI 33.97 kg/m2  SpO2 100%   Physical Exam  General: Well-developed, well-nourished female in no acute distress; appearance consistent with age of record HENT: normocephalic; atraumatic; pharyngeal erythema; abnormal breath odor Eyes: pupils equal, round and reactive to light; extraocular  muscles intact Neck: supple Heart: regular rate and rhythm Lungs: clear to auscultation bilaterally Abdomen: soft; nondistended; nontender; no masses or hepatosplenomegaly; bowel sounds present Extremities: No deformity; full range of motion; pulses normal Neurologic: Awake, alert and oriented; motor function intact in all extremities and symmetric; no facial droop Skin: Warm and dry Psychiatric: Normal mood and affect    ED Course  Procedures (including critical care time)   MDM   Nursing notes and vitals signs, including pulse oximetry, reviewed.  Summary of this visit's results, reviewed by myself:  Labs:  Results for orders placed or performed during the hospital encounter of 01/20/16 (from the past 24 hour(s))  Comprehensive metabolic panel     Status: Abnormal   Collection Time: 01/20/16  3:01 AM  Result Value Ref Range   Sodium 137 135 - 145 mmol/L   Potassium 3.8 3.5 - 5.1 mmol/L   Chloride 106 101 - 111 mmol/L   CO2 23 22 - 32 mmol/L   Glucose, Bld 111 (H) 65 - 99 mg/dL   BUN 11 6 - 20 mg/dL   Creatinine, Ser 0.95 0.44 - 1.00 mg/dL   Calcium 8.7 (L) 8.9 - 10.3 mg/dL   Total Protein 8.3 (H) 6.5 - 8.1 g/dL   Albumin 3.7 3.5 - 5.0 g/dL   AST 22 15 - 41 U/L   ALT 12 (L) 14 - 54 U/L   Alkaline Phosphatase 60 38 - 126 U/L   Total Bilirubin 0.3 0.3 - 1.2 mg/dL   GFR calc non Af Amer >60 >60 mL/min   GFR calc Af Amer >60 >60 mL/min   Anion gap 8 5 - 15  I-Stat CG4 Lactic Acid, ED     Status: None   Collection Time: 01/20/16  3:08 AM  Result Value Ref Range   Lactic Acid, Venous 1.19 0.5 - 2.0 mmol/L  I-Stat CG4 Lactic Acid, ED     Status: None   Collection Time: 01/20/16  5:26 AM  Result Value Ref Range   Lactic Acid, Venous 0.63 0.5 - 2.0 mmol/L  CBC with Differential/Platelet     Status: Abnormal   Collection Time: 01/20/16  6:01 AM  Result Value Ref Range   WBC 4.6 4.0 - 10.5 K/uL   RBC 4.06 3.87 - 5.11 MIL/uL   Hemoglobin 8.5 (L) 12.0 - 15.0 g/dL   HCT  27.3 (L) 36.0 - 46.0 %   MCV 67.2 (L) 78.0 - 100.0 fL   MCH 20.9 (L) 26.0 - 34.0 pg   MCHC 31.1 30.0 - 36.0 g/dL   RDW 16.1 (H) 11.5 - 15.5 %   Platelets 131 (L) 150 - 400 K/uL   Neutrophils Relative % 70 %   Lymphocytes Relative 21 %   Monocytes Relative 5 %   Eosinophils Relative 4 %  Basophils Relative 0 %   Neutro Abs 3.2 1.7 - 7.7 K/uL   Lymphs Abs 1.0 0.7 - 4.0 K/uL   Monocytes Absolute 0.2 0.1 - 1.0 K/uL   Eosinophils Absolute 0.2 0.0 - 0.7 K/uL   Basophils Absolute 0.0 0.0 - 0.1 K/uL   RBC Morphology ELLIPTOCYTES   Rapid strep screen     Status: Abnormal   Collection Time: 01/20/16  6:02 AM  Result Value Ref Range   Streptococcus, Group A Screen (Direct) POSITIVE (A) NEGATIVE    Imaging Studies: Dg Chest 2 View  01/20/2016  CLINICAL DATA:  Fever for 2 days.  Body aches. EXAM: CHEST  2 VIEW COMPARISON:  07/29/2015 FINDINGS: Lung volumes are low. The cardiomediastinal contours are normal. The lungs are clear. Pulmonary vasculature is normal. No consolidation, pleural effusion, or pneumothorax. No acute osseous abnormalities are seen. IMPRESSION: Low lung volumes without acute process. Electronically Signed   By: Jeb Levering M.D.   On: 01/20/2016 03:18   6:49 AM Patient is positive for group A strep which is consistent with her symptomatology and high fever. She had a similar diagnosis in October of last year. We will treat with Bicillin L-A.    Shanon Rosser, MD 01/20/16 [phone]

## 2016-01-20 NOTE — ED Notes (Signed)
Patient is alert and oriented x4.  She is complaining of a fever with chills that started Sunday. Patient has been taking ibuprofen to break the fever but it keeps coming back.  Currently she  Rates he pain 10 of 10 generalized all over.

## 2016-01-25 LAB — CULTURE, BLOOD (ROUTINE X 2)
Culture: NO GROWTH
Culture: NO GROWTH

## 2016-02-01 ENCOUNTER — Ambulatory Visit (HOSPITAL_COMMUNITY): Payer: Self-pay | Admitting: Clinical

## 2016-07-08 ENCOUNTER — Encounter (HOSPITAL_COMMUNITY): Payer: Self-pay

## 2016-11-03 ENCOUNTER — Other Ambulatory Visit (HOSPITAL_COMMUNITY): Payer: Self-pay | Admitting: Psychiatry

## 2016-11-03 DIAGNOSIS — F332 Major depressive disorder, recurrent severe without psychotic features: Secondary | ICD-10-CM

## 2016-11-03 DIAGNOSIS — F411 Generalized anxiety disorder: Secondary | ICD-10-CM

## 2017-03-23 ENCOUNTER — Encounter (HOSPITAL_COMMUNITY): Payer: Self-pay

## 2017-12-22 ENCOUNTER — Emergency Department (HOSPITAL_BASED_OUTPATIENT_CLINIC_OR_DEPARTMENT_OTHER)
Admission: EM | Admit: 2017-12-22 | Discharge: 2017-12-22 | Disposition: A | Discharge status: Home / Self Care | Payer: BLUE CROSS/BLUE SHIELD | Attending: Emergency Medicine | Admitting: Emergency Medicine

## 2017-12-22 ENCOUNTER — Encounter (HOSPITAL_BASED_OUTPATIENT_CLINIC_OR_DEPARTMENT_OTHER): Payer: Self-pay | Admitting: *Deleted

## 2017-12-22 ENCOUNTER — Emergency Department (HOSPITAL_BASED_OUTPATIENT_CLINIC_OR_DEPARTMENT_OTHER): Payer: BLUE CROSS/BLUE SHIELD

## 2017-12-22 ENCOUNTER — Other Ambulatory Visit: Payer: Self-pay

## 2017-12-22 DIAGNOSIS — R079 Chest pain, unspecified: Secondary | ICD-10-CM | POA: Insufficient documentation

## 2017-12-22 DIAGNOSIS — J45909 Unspecified asthma, uncomplicated: Secondary | ICD-10-CM | POA: Insufficient documentation

## 2017-12-22 DIAGNOSIS — Z87891 Personal history of nicotine dependence: Secondary | ICD-10-CM | POA: Insufficient documentation

## 2017-12-22 DIAGNOSIS — R0789 Other chest pain: Secondary | ICD-10-CM

## 2017-12-22 DIAGNOSIS — Z79899 Other long term (current) drug therapy: Secondary | ICD-10-CM | POA: Insufficient documentation

## 2017-12-22 DIAGNOSIS — I10 Essential (primary) hypertension: Secondary | ICD-10-CM | POA: Insufficient documentation

## 2017-12-22 DIAGNOSIS — E039 Hypothyroidism, unspecified: Secondary | ICD-10-CM | POA: Insufficient documentation

## 2017-12-22 LAB — COMPREHENSIVE METABOLIC PANEL
ALT: 18 U/L (ref 14–54)
AST: 25 U/L (ref 15–41)
Albumin: 3.8 g/dL (ref 3.5–5.0)
Alkaline Phosphatase: 76 U/L (ref 38–126)
Anion gap: 8 (ref 5–15)
BUN: 13 mg/dL (ref 6–20)
CO2: 23 mmol/L (ref 22–32)
CREATININE: 0.83 mg/dL (ref 0.44–1.00)
Calcium: 8.7 mg/dL — ABNORMAL LOW (ref 8.9–10.3)
Chloride: 110 mmol/L (ref 101–111)
GFR calc non Af Amer: >60 mL/min (ref >60)
Glucose, Bld: 88 mg/dL (ref 65–99)
Potassium: 3.6 mmol/L (ref 3.5–5.1)
Sodium: 141 mmol/L (ref 135–145)
Total Bilirubin: 0.2 mg/dL — ABNORMAL LOW (ref 0.3–1.2)
Total Protein: 8.5 g/dL — ABNORMAL HIGH (ref 6.5–8.1)

## 2017-12-22 LAB — CBC WITH DIFFERENTIAL/PLATELET
BASOS ABS: 0 10*3/uL (ref 0.0–0.1)
Basophils Relative: 0 %
Eosinophils Absolute: 0.2 10*3/uL (ref 0.0–0.7)
Eosinophils Relative: 3 %
HEMATOCRIT: 28.4 % — AB (ref 36.0–46.0)
HEMOGLOBIN: 9 g/dL — AB (ref 12.0–15.0)
LYMPHS ABS: 1.6 10*3/uL (ref 0.7–4.0)
LYMPHS PCT: 27 %
MCH: 21.5 pg — ABNORMAL LOW (ref 26.0–34.0)
MCHC: 31.7 g/dL (ref 30.0–36.0)
MCV: 67.9 fL — ABNORMAL LOW (ref 78.0–100.0)
MONOS PCT: 10 %
Monocytes Absolute: 0.6 10*3/uL (ref 0.1–1.0)
NEUTROS PCT: 60 %
Neutro Abs: 3.7 10*3/uL (ref 1.7–7.7)
Platelets: 246 10*3/uL (ref 150–400)
RBC: 4.18 MIL/uL (ref 3.87–5.11)
RDW: 17.3 % — AB (ref 11.5–15.5)
WBC: 6.1 10*3/uL (ref 4.0–10.5)

## 2017-12-22 LAB — LIPASE, BLOOD: Lipase: 37 U/L (ref 11–51)

## 2017-12-22 LAB — D-DIMER, QUANTITATIVE: D-Dimer, Quant: 0.41 ug/mL-FEU (ref 0.00–0.50)

## 2017-12-22 LAB — TROPONIN I
Troponin I: <0.03 ng/mL (ref <0.03)
Troponin I: <0.03 ng/mL (ref <0.03)

## 2017-12-22 MED ORDER — ASPIRIN 81 MG PO CHEW
324.0000 mg | CHEWABLE_TABLET | Freq: Once | ORAL | Status: AC
  Administered 2017-12-22: 324 mg via ORAL
  Filled 2017-12-22: qty 4

## 2017-12-22 NOTE — ED Triage Notes (Signed)
Epigastric pain into her back for a week. Worse at night and when she lays in certain positions.

## 2017-12-22 NOTE — Discharge Instructions (Signed)
Take 4 over the counter ibuprofen tablets 3 times a day or 2 over-the-counter naproxen tablets twice a day for pain. Also take tylenol 1000mg(2 extra strength) four times a day.    

## 2017-12-22 NOTE — ED Provider Notes (Signed)
[facility] DEPART[facility] Provider Note   CSN: [id number] Arrival date & time: 12/22/17  1737     History   Chief Complaint Chief Complaint  Patient presents with  . Chest Pain    HPI Michelle Huffman is a 51 y.o. female.  52 yo F with a chief complaint of chest pain.  This been going on for the past week.  Usually worse when she tries to get up from a seated position.  Usually improves as she is up and moving.  She does feel it worsens a little bit with exertion.  She also feels that it hurts when she takes deep breath.  She denies trauma denies cough congestion or fever.  Denies lower extremity edema.  Denies history of PE or DVT.  Denies recent surgery, prolonged travel, recent hospitalization, estrogen use.  She denies history of cancer.  She denies history of MI.  Denies smoking, hypertension, hyperlipidemia, diabetes.  Denies family history of MI.  Describes the pain as a discomfort.  Radiates straight into her back.  Sometimes she thinks the pain starts in her back and then goes to the front.  She denies nausea or vomiting denies fevers or chills.  The history is provided by the patient.  Chest Pain   This is a new problem. The current episode started more than 1 week ago. The problem occurs constantly. The problem has not changed since onset.The pain is associated with movement. The pain is at a severity of 6/10. The pain is moderate. The quality of the pain is described as brief. The pain does not radiate. Duration of episode(s) is 1 week. The symptoms are aggravated by certain positions. Pertinent negatives include no dizziness, no fever, no headaches, no nausea, no palpitations, no shortness of breath and no vomiting. She has tried nothing for the symptoms. The treatment provided no relief.  Pertinent negatives for past medical history include no DVT, no MI and no PE. Past medical history comments: lupus    Past Medical History:  Diagnosis Date  . Alpha  thalassemia major ([place])   . Anemia   . Arthritis   . Asthma   . Autoimmune disease ([place])   . Blood transfusion   . Chest pain   . Complication of anesthesia   . Hypertension   . Hypothyroidism   . Lupus   . PONV (postoperative nausea and vomiting)     Patient Active Problem List   Diagnosis Date Noted  . Insomnia 11/17/2015  . GAD (generalized anxiety disorder) 11/17/2015  . Severe episode of recurrent major depressive disorder, without psychotic features ([place]) 11/17/2015  . Morbid obesity ([place]) 03/27/2014  . Right shoulder pain 03/06/2014  . Biliary dyskinesia 07/11/2011  . PAROTIDITIS 10/11/2007  . PITUITARY ADENOMA, BENIGN 07/25/2007  . HYPOTHYROIDISM 05/03/2007  . ANEMIA-IRON DEFICIENCY 05/03/2007    Past Surgical History:  Procedure Laterality Date  . ABDOMINAL HYSTERECTOMY  2009  . CHOLECYSTECTOMY  07/21/11  . KNEE SURGERY  2010   laproscopic right knee  . LAPAROSCOPIC GASTRIC SLEEVE RESECTION N/A 08/19/2014   Procedure: LAPAROSCOPIC GASTRIC SLEEVE RESECTION;  Surgeon: Excell Seltzer, MD;  Location: [facility];  Service: General;  Laterality: N/A;  . UPPER GI ENDOSCOPY  08/19/2014   Procedure: UPPER GI ENDOSCOPY;  Surgeon: Excell Seltzer, MD;  Location: [facility];  Service: General;;     OB History   None      Home Medications    Prior to Admission medications   Medication Sig  Start Date End Date Taking? Authorizing Provider  hydroxychloroquine (PLAQUENIL) 200 MG tablet Take 200 mg by mouth 2 (two) times daily.  01/28/14  Yes [provider]  IBUPROFEN PO Take by mouth.   Yes [provider]  hydrOXYzine (VISTARIL) 25 MG capsule Take 25-50mg  po qHS prn insomnia Patient taking differently: Take 25-50 mg by mouth at bedtime as needed (insomnia).  11/17/15   Charlcie Cradle, MD  naproxen sodium (ANAPROX) 220 MG tablet Take 220 mg by mouth 2 (two) times daily as needed (pain). Reported on 11/17/2015    [provider]  ondansetron  (ZOFRAN ODT) 8 MG disintegrating tablet Take 1 tablet (8 mg total) by mouth every 8 (eight) hours as needed for nausea or vomiting. 01/20/16   Molpus, John, MD  oxyCODONE-acetaminophen (PERCOCET) 5-325 MG tablet Take 1-2 tablets by mouth every 6 (six) hours as needed (for pain). 01/20/16   Molpus, John, MD  PARoxetine (PAXIL) 20 MG tablet Take 1 tablet (20 mg total) by mouth daily. 11/17/15 11/16/16  Charlcie Cradle, MD    Family History Family History  Problem Relation Age of Onset  . Depression Mother   . Asthma Other   . Hypertension Other   . Hyperlipidemia Other   . Alcohol abuse Neg Hx   . Anxiety disorder Neg Hx   . Bipolar disorder Neg Hx   . Dementia Neg Hx   . Drug abuse Neg Hx   . Schizophrenia Neg Hx     Social History Social History   Tobacco Use  . Smoking status: Former Smoker    Last attempt to quit: 11/17/2007    Years since quitting: 10.1  . Smokeless tobacco: Never Used  Substance Use Topics  . Alcohol use: Yes    Comment: socially- a few times a year  . Drug use: No     Allergies   Sulfa antibiotics   Review of Systems Review of Systems  Constitutional: Negative for chills and fever.  HENT: Negative for congestion and rhinorrhea.   Eyes: Negative for redness and visual disturbance.  Respiratory: Negative for shortness of breath and wheezing.   Cardiovascular: Positive for chest pain. Negative for palpitations.  Gastrointestinal: Negative for nausea and vomiting.  Genitourinary: Negative for dysuria and urgency.  Musculoskeletal: Negative for arthralgias and myalgias.  Skin: Negative for pallor and wound.  Neurological: Negative for dizziness and headaches.     Physical Exam Updated Vital Signs BP (!) 155/86 (BP Location: Right Arm)   Pulse (!) 57   Temp 98.2 F (36.8 C) (Oral)   Resp 20   Ht 5\' 4"  (1.626 m)   Wt 93.9 kg (207 lb)   SpO2 100%   BMI 35.53 kg/m   Physical Exam  Constitutional: She is oriented to person, place, and time.  She appears well-developed and well-nourished. No distress.  HENT:  Head: Normocephalic and atraumatic.  Eyes: Pupils are equal, round, and reactive to light. EOM are normal.  Neck: Normal range of motion. Neck supple.  Cardiovascular: Normal rate and regular rhythm. Exam reveals no gallop and no friction rub.  No murmur heard. Pulmonary/Chest: Effort normal. She has no wheezes. She has no rales.  Abdominal: Soft. She exhibits no distension. There is no tenderness.  Musculoskeletal: She exhibits no edema or tenderness.  Mild mid thoracic back pain about t8   Neurological: She is alert and oriented to person, place, and time.  Skin: Skin is warm and dry. She is not diaphoretic.  Psychiatric: She has  a normal mood and affect. Her behavior is normal.  Nursing note and vitals reviewed.    ED Treatments / Results  Labs (all labs ordered are listed, but only abnormal results are displayed) Labs Reviewed  CBC WITH DIFFERENTIAL/PLATELET - Abnormal; Notable for the following components:      Result Value   Hemoglobin 9.0 (*)    HCT 28.4 (*)    MCV 67.9 (*)    MCH 21.5 (*)    RDW 17.3 (*)    All other components within normal limits  COMPREHENSIVE METABOLIC PANEL - Abnormal; Notable for the following components:   Calcium 8.7 (*)    Total Protein 8.5 (*)    Total Bilirubin 0.2 (*)    All other components within normal limits  D-DIMER, QUANTITATIVE (NOT AT [facility])  TROPONIN I  LIPASE, BLOOD  TROPONIN I    EKG  EKG Interpretation  Date/Time:  Friday December 22 2017 17:58:32 EDT Ventricular Rate:  63 PR Interval:    QRS Duration: 104 QT Interval:  427 QTC Calculation: 438 R Axis:   32 Text Interpretation:  Sinus rhythm Baseline wander in lead(s) V6 No significant change since last tracing Confirmed by Deno Etienne [phone]) on 12/22/2017 6:01:08 PM        Radiology Dg Chest 2 View  Result Date: 12/22/2017 CLINICAL DATA:  Intermittent and worsening chest pain for 10 days.  Occasional smoker. EXAM: CHEST - 2 VIEW COMPARISON:  Radiographs 01/20/2016 and 07/29/2015. FINDINGS: Improved pulmonary aeration. The heart is mildly enlarged. The mediastinal contours are normal. The lungs are clear. There is no pleural effusion or pneumothorax. No acute osseous findings are demonstrated. IMPRESSION: Mild cardiomegaly.  No acute cardiopulmonary process. Electronically Signed   By: Richardean Sale M.D.   On: 12/22/2017 18:52    Procedures Procedures (including critical care time)  Medications Ordered in ED Medications  aspirin chewable tablet 324 mg (324 mg Oral Given 12/22/17 1822)     Initial Impression / Assessment and Plan / ED Course  I have reviewed the triage vital signs and the nursing notes.  Pertinent labs & imaging results that were available during my care of the patient were reviewed by me and considered in my medical decision making (see chart for details).     52 yo F with a significant past medical history of lupus comes with a chief complaint of chest pain.  Sounds most likely muscular skeletal by history and physical.  With her history of lupus will obtain a delta troponin as well as a d-dimer.  Delta negative, Ddimer negative.   The patients results and plan were reviewed and discussed.   Any x-rays performed were independently reviewed by myself.   Differential diagnosis were considered with the presenting HPI.  Medications  aspirin chewable tablet 324 mg (324 mg Oral Given 12/22/17 1822)    Vitals:   12/22/17 1900 12/22/17 1930 12/22/17 2000 12/22/17 2217  BP: (!) 146/94 (!) 156/100 (!) 162/100 (!) 155/86  Pulse: 65 65  (!) 57  Resp: 20 (!) 23 (!) 23 20  Temp:    98.2 F (36.8 C)  TempSrc:    Oral  SpO2:  100%  100%  Weight:      Height:        Final diagnoses:  Atypical chest pain    Admission/ observation were discussed with the admitting physician, patient and/or family and they are comfortable with the plan.    Final  Clinical Impressions(s) / ED Diagnoses  Final diagnoses:  Atypical chest pain    ED Discharge Orders    None       Deno Etienne, DO 12/22/17 2328

## 2017-12-22 NOTE — ED Notes (Signed)
Patient states intermittent chest pain radiating to the back.  Pain gets better with rest.  Patient currently denies chest pain but back pain is present and worsens when she changes position.

## 2018-03-15 ENCOUNTER — Emergency Department (HOSPITAL_BASED_OUTPATIENT_CLINIC_OR_DEPARTMENT_OTHER)
Admission: EM | Admit: 2018-03-15 | Discharge: 2018-03-15 | Disposition: A | Discharge status: Home / Self Care | Payer: Medicaid Other | Attending: Emergency Medicine | Admitting: Emergency Medicine

## 2018-03-15 ENCOUNTER — Other Ambulatory Visit: Payer: Self-pay

## 2018-03-15 ENCOUNTER — Emergency Department (HOSPITAL_BASED_OUTPATIENT_CLINIC_OR_DEPARTMENT_OTHER): Payer: Medicaid Other

## 2018-03-15 ENCOUNTER — Encounter (HOSPITAL_BASED_OUTPATIENT_CLINIC_OR_DEPARTMENT_OTHER): Payer: Self-pay | Admitting: *Deleted

## 2018-03-15 DIAGNOSIS — E039 Hypothyroidism, unspecified: Secondary | ICD-10-CM | POA: Insufficient documentation

## 2018-03-15 DIAGNOSIS — R05 Cough: Secondary | ICD-10-CM | POA: Insufficient documentation

## 2018-03-15 DIAGNOSIS — F17200 Nicotine dependence, unspecified, uncomplicated: Secondary | ICD-10-CM | POA: Insufficient documentation

## 2018-03-15 DIAGNOSIS — Z79899 Other long term (current) drug therapy: Secondary | ICD-10-CM | POA: Insufficient documentation

## 2018-03-15 DIAGNOSIS — R059 Cough, unspecified: Secondary | ICD-10-CM

## 2018-03-15 DIAGNOSIS — I10 Essential (primary) hypertension: Secondary | ICD-10-CM | POA: Insufficient documentation

## 2018-03-15 MED ORDER — AEROCHAMBER PLUS FLO-VU SMALL MISC
1.0000 | Freq: Once | Status: AC
  Administered 2018-03-15: 1
  Filled 2018-03-15: qty 1

## 2018-03-15 MED ORDER — PROMETHAZINE-DM 6.25-15 MG/5ML PO SYRP: 5.0000 mL | ORAL_SOLUTION | Freq: Four times a day (QID) | ORAL | 0 refills | Status: DC | PRN

## 2018-03-15 MED ORDER — IPRATROPIUM-ALBUTEROL 0.5-2.5 (3) MG/3ML IN SOLN
3.0000 mL | Freq: Four times a day (QID) | RESPIRATORY_TRACT | Status: DC
  Administered 2018-03-15: 3 mL via RESPIRATORY_TRACT
  Filled 2018-03-15: qty 3

## 2018-03-15 MED ORDER — ALBUTEROL SULFATE HFA 108 (90 BASE) MCG/ACT IN AERS
2.0000 | INHALATION_SPRAY | RESPIRATORY_TRACT | Status: DC
  Administered 2018-03-15: 2 via RESPIRATORY_TRACT
  Filled 2018-03-15: qty 6.7

## 2018-03-15 MED FILL — predniSONE 10 MG TABS: 10 | 5 days supply | Qty: 5 | Fill #0

## 2018-03-15 MED FILL — PROMETHAZINE W/DM SYRUP: 6.25-15 | 6 days supply | Qty: 118 | Fill #0

## 2018-03-15 NOTE — Discharge Instructions (Signed)
Thank you for allowing me to provide your care today in the emergency department.  Your chest x-ray did not show any signs of infection or pneumonia.  Sometimes after an illness, the cough can be the last thing that goes away.  Sometimes this can last up to 8 weeks after an illness.  Try swallowing a teaspoon of honey to see if it improves your cough.  You can also swallow 5 mL of promethazine DM every 6 hours as needed for cough.  A coolmist vaporizer may also help to improve your symptoms.  You can take 2 puffs of the albuterol inhaler with a spacer every 4 hours as needed for shortness of breath or severe coughing.  If your cough does not improve in the next few weeks, please follow-up with your primary care provider.  Return to the emergency department if you develop thick sputum with your cough, severe back pain, high fever, or other new concerning symptoms.

## 2018-03-15 NOTE — ED Notes (Signed)
ED Provider at bedside. 

## 2018-03-15 NOTE — ED Triage Notes (Signed)
Cough for a month. Heaviness in her chest x 2 months. She has seen an MD about the chest pain and treated for costal chondritis. She had a normal EKG after that diagnosis.

## 2018-03-15 NOTE — ED Provider Notes (Signed)
[facility] Provider Note   CSN: [id number] Arrival date & time: 03/15/18  1139     History   Chief Complaint Chief Complaint  Patient presents with  . Cough    HPI Michelle Huffman is a 52 y.o. female with a h/o of Lupus, anemia, arthritis, and alpha thalassemia major who presents to the emergency department with a chief complaint of cough.  The patient endorses a nonproductive cough for the last month. She was diagnosed with conjunctivitis, bilateral otitis media, and and sinusitis 1 month ago.  He was initially treated with Augmentin, followed by a course of Ceftin ear.  She fully completed the Augmentin but did not complete the Ceftin ear because she was feeling better.  She reports her cough improved after course of cough syrup with codeine provided by her PCP.  She ran out of it a week ago.  She reports that she has seen her PCP almost weekly for the last month because her symptoms have not improved.  She was advised to come to the ED today for chest x-ray by her PCP to rule out pneumonia.  The patient denies dyspnea, chest pain, fever, chills, back pain, sputum production, URI symptoms, abdominal pain, nausea, vomiting, diarrhea, rash.  She reports that she is previously tried [employer] with no improvement.  She denies a history of GERD or chronic pulmonary pathology.  Current medications include Plaquenil and NSAIDs.   The history is provided by the patient. No language interpreter was used.  Cough  Pertinent negatives include no chest pain, no chills, no headaches, no rhinorrhea, no shortness of breath and no wheezing.    Past Medical History:  Diagnosis Date  . Alpha thalassemia major ([place])   . Anemia   . Arthritis   . Asthma   . Autoimmune disease ([place])   . Blood transfusion   . Chest pain   . Complication of anesthesia   . Hypertension   . Hypothyroidism   . Lupus ([place])   . PONV (postoperative nausea and vomiting)      Patient Active Problem List   Diagnosis Date Noted  . Insomnia 11/17/2015  . GAD (generalized anxiety disorder) 11/17/2015  . Severe episode of recurrent major depressive disorder, without psychotic features ([place]) 11/17/2015  . Morbid obesity ([place]) 03/27/2014  . Right shoulder pain 03/06/2014  . Biliary dyskinesia 07/11/2011  . PAROTIDITIS 10/11/2007  . PITUITARY ADENOMA, BENIGN 07/25/2007  . HYPOTHYROIDISM 05/03/2007  . ANEMIA-IRON DEFICIENCY 05/03/2007    Past Surgical History:  Procedure Laterality Date  . ABDOMINAL HYSTERECTOMY  2009  . CHOLECYSTECTOMY  07/21/11  . KNEE SURGERY  2010   laproscopic right knee  . LAPAROSCOPIC GASTRIC SLEEVE RESECTION N/A 08/19/2014   Procedure: LAPAROSCOPIC GASTRIC SLEEVE RESECTION;  Surgeon: Excell Seltzer, MD;  Location: [facility];  Service: General;  Laterality: N/A;  . UPPER GI ENDOSCOPY  08/19/2014   Procedure: UPPER GI ENDOSCOPY;  Surgeon: Excell Seltzer, MD;  Location: [facility];  Service: General;;     OB History   None      Home Medications    Prior to Admission medications   Medication Sig Start Date End Date Taking? Authorizing Provider  hydroxychloroquine (PLAQUENIL) 200 MG tablet Take 200 mg by mouth 2 (two) times daily.  01/28/14   [provider]  hydrOXYzine (VISTARIL) 25 MG capsule Take 25-50mg  po qHS prn insomnia Patient taking differently: Take 25-50 mg by mouth at bedtime as needed (insomnia).  11/17/15  Charlcie Cradle, MD  IBUPROFEN PO Take by mouth.    [provider]  naproxen (NAPROSYN) 500 MG tablet Take 500 mg by mouth 2 (two) times daily. 01/27/18   [provider]  naproxen sodium (ANAPROX) 220 MG tablet Take 220 mg by mouth 2 (two) times daily as needed (pain). Reported on 11/17/2015    [provider]  ondansetron (ZOFRAN ODT) 8 MG disintegrating tablet Take 1 tablet (8 mg total) by mouth every 8 (eight) hours as needed for nausea or vomiting. 01/20/16   Molpus, John, MD   oxyCODONE-acetaminophen (PERCOCET) 5-325 MG tablet Take 1-2 tablets by mouth every 6 (six) hours as needed (for pain). 01/20/16   Molpus, John, MD  PARoxetine (PAXIL) 20 MG tablet Take 1 tablet (20 mg total) by mouth daily. 11/17/15 11/16/16  Charlcie Cradle, MD  promethazine-dextromethorphan (PROMETHAZINE-DM) 6.25-15 MG/5ML syrup Take 5 mLs by mouth 4 (four) times daily as needed for cough. 03/15/18   Loie Jahr A, PA-C    Family History Family History  Problem Relation Age of Onset  . Depression Mother   . Asthma Other   . Hypertension Other   . Hyperlipidemia Other   . Alcohol abuse Neg Hx   . Anxiety disorder Neg Hx   . Bipolar disorder Neg Hx   . Dementia Neg Hx   . Drug abuse Neg Hx   . Schizophrenia Neg Hx     Social History Social History   Tobacco Use  . Smoking status: Former Smoker    Last attempt to quit: 11/17/2007    Years since quitting: 10.3  . Smokeless tobacco: Never Used  Substance Use Topics  . Alcohol use: Yes    Comment: socially- a few times a year  . Drug use: No     Allergies   Sulfa antibiotics   Review of Systems Review of Systems  Constitutional: Negative for activity change, chills and fever.  HENT: Negative for congestion, rhinorrhea and sinus pain.   Eyes: Negative for visual disturbance.  Respiratory: Positive for cough. Negative for shortness of breath and wheezing.   Cardiovascular: Negative for chest pain, palpitations and leg swelling.  Gastrointestinal: Negative for abdominal pain, diarrhea and vomiting.  Genitourinary: Negative for dysuria.  Musculoskeletal: Negative for back pain.  Skin: Negative for rash.  Allergic/Immunologic: Negative for immunocompromised state.  Neurological: Negative for headaches.  Psychiatric/Behavioral: Negative for confusion.     Physical Exam Updated Vital Signs BP 132/81 (BP Location: Right Arm)   Pulse 66   Temp 98.4 F (36.9 C) (Oral)   Resp 14   Ht 5' 4.5" (1.638 m)   Wt 89.4 kg (197  lb)   SpO2 100%   BMI 33.29 kg/m   Physical Exam  Constitutional: No distress.  HENT:  Head: Normocephalic and atraumatic.  Right Ear: Hearing, tympanic membrane and ear canal normal.  Left Ear: Hearing, tympanic membrane and ear canal normal.  Nose: Nose normal. Right sinus exhibits no maxillary sinus tenderness and no frontal sinus tenderness. Left sinus exhibits no maxillary sinus tenderness and no frontal sinus tenderness.  Mouth/Throat: Uvula is midline, oropharynx is clear and moist and mucous membranes are normal.  Eyes: Conjunctivae are normal.  Neck: Neck supple.  Cardiovascular: Normal rate, regular rhythm, normal heart sounds and intact distal pulses. Exam reveals no gallop and no friction rub.  No murmur heard. Pulmonary/Chest: Effort normal. No stridor. No respiratory distress. She has no wheezes. She has no rales. She exhibits no tenderness.  Dry cough.  Speaks in complete fluent sentences.  Abdominal: Soft. She exhibits no distension.  Neurological: She is alert.  Skin: Skin is warm. No rash noted. She is not diaphoretic.  Psychiatric: Her behavior is normal.  Nursing note and vitals reviewed.    ED Treatments / Results  Labs (all labs ordered are listed, but only abnormal results are displayed) Labs Reviewed - No data to display  EKG None  Radiology Dg Chest 2 View  Result Date: 03/15/2018 CLINICAL DATA:  Cough for 1 month EXAM: CHEST - 2 VIEW COMPARISON:  12/22/2017 FINDINGS: Cardiac shadow is within normal limits. The lungs are well aerated bilaterally. No focal infiltrate or sizable effusion is seen. Degenerative changes of the thoracic spine are noted. IMPRESSION: No acute abnormality noted. Electronically Signed   By: Inez Catalina M.D.   On: 03/15/2018 13:12    Procedures Procedures (including critical care time)  Medications Ordered in ED Medications  AEROCHAMBER PLUS FLO-VU SMALL device MISC 1 each (1 each Other Given 03/15/18 1427)     Initial  Impression / Assessment and Plan / ED Course  I have reviewed the triage vital signs and the nursing notes.  Pertinent labs & imaging results that were available during my care of the patient were reviewed by me and considered in my medical decision making (see chart for details).     52 y.o. female with a h/o of Lupus, anemia, arthritis, and alpha thalassemia major who presents to the emergency department with a chief complaint of cough for 1 month.  She was treated for several infections approximately 1 month ago, which have all resolved.  She has no other associated symptoms today.  DuoNeb treatment given in ED.  She reports some improvement in her symptoms.  Will provide the patient with an albuterol inhaler with a spacer.  Doubt GERD, medication side effects, or asthma as a source of her cough.  I suspect that this is a lingering symptom from her recent URI.  I have recommended honey and Promethazine DM.  Also discussed with the patient that cough can be the last symptom to resolve after an infection and may take up to 8 weeks or longer.  All questions answered.  She is hemodynamically stable in no acute distress.  Strict return precautions given.  She is safe for discharge to home with outpatient follow-up at this time.  Final Clinical Impressions(s) / ED Diagnoses   Final diagnoses:  Cough    ED Discharge Orders        Ordered    promethazine-dextromethorphan (PROMETHAZINE-DM) 6.25-15 MG/5ML syrup  4 times daily PRN     03/15/18 1420       Marquise Wicke A, PA-C 03/15/18 2046    Tanna Furry, MD 03/24/18 [phone]

## 2018-04-03 DIAGNOSIS — M359 Systemic involvement of connective tissue, unspecified: Secondary | ICD-10-CM | POA: Diagnosis not present

## 2018-04-03 DIAGNOSIS — G4700 Insomnia, unspecified: Secondary | ICD-10-CM | POA: Diagnosis not present

## 2018-04-03 DIAGNOSIS — R69 Illness, unspecified: Secondary | ICD-10-CM | POA: Diagnosis not present

## 2018-04-03 DIAGNOSIS — I10 Essential (primary) hypertension: Secondary | ICD-10-CM | POA: Diagnosis not present

## 2018-04-03 DIAGNOSIS — D508 Other iron deficiency anemias: Secondary | ICD-10-CM | POA: Diagnosis not present

## 2018-04-03 DIAGNOSIS — J45909 Unspecified asthma, uncomplicated: Secondary | ICD-10-CM | POA: Diagnosis not present

## 2018-04-09 DIAGNOSIS — K219 Gastro-esophageal reflux disease without esophagitis: Secondary | ICD-10-CM | POA: Diagnosis not present

## 2018-04-09 DIAGNOSIS — Z9884 Bariatric surgery status: Secondary | ICD-10-CM | POA: Diagnosis not present

## 2018-04-11 DIAGNOSIS — I10 Essential (primary) hypertension: Secondary | ICD-10-CM | POA: Diagnosis not present

## 2018-04-11 DIAGNOSIS — Z713 Dietary counseling and surveillance: Secondary | ICD-10-CM | POA: Diagnosis not present

## 2018-04-11 DIAGNOSIS — Z6832 Body mass index (BMI) 32.0-32.9, adult: Secondary | ICD-10-CM | POA: Diagnosis not present

## 2018-04-17 DIAGNOSIS — N390 Urinary tract infection, site not specified: Secondary | ICD-10-CM | POA: Diagnosis not present

## 2018-04-17 DIAGNOSIS — I7300 Raynaud's syndrome without gangrene: Secondary | ICD-10-CM | POA: Diagnosis not present

## 2018-04-17 DIAGNOSIS — Z79899 Other long term (current) drug therapy: Secondary | ICD-10-CM | POA: Diagnosis not present

## 2018-04-17 DIAGNOSIS — E669 Obesity, unspecified: Secondary | ICD-10-CM | POA: Diagnosis not present

## 2018-04-17 DIAGNOSIS — M329 Systemic lupus erythematosus, unspecified: Secondary | ICD-10-CM | POA: Diagnosis not present

## 2018-05-08 DIAGNOSIS — H40019 Open angle with borderline findings, low risk, unspecified eye: Secondary | ICD-10-CM | POA: Diagnosis not present

## 2018-05-10 DIAGNOSIS — Z6832 Body mass index (BMI) 32.0-32.9, adult: Secondary | ICD-10-CM | POA: Diagnosis not present

## 2018-05-10 DIAGNOSIS — I10 Essential (primary) hypertension: Secondary | ICD-10-CM | POA: Diagnosis not present

## 2018-05-10 DIAGNOSIS — Z713 Dietary counseling and surveillance: Secondary | ICD-10-CM | POA: Diagnosis not present

## 2018-11-18 ENCOUNTER — Ambulatory Visit (HOSPITAL_COMMUNITY)
Admission: EM | Admit: 2018-11-18 | Discharge: 2018-11-18 | Disposition: A | Discharge status: Home / Self Care | Payer: 59 | Attending: Family Medicine | Admitting: Family Medicine

## 2018-11-18 ENCOUNTER — Encounter (HOSPITAL_COMMUNITY): Payer: Self-pay | Admitting: Emergency Medicine

## 2018-11-18 DIAGNOSIS — K529 Noninfective gastroenteritis and colitis, unspecified: Secondary | ICD-10-CM

## 2018-11-18 DIAGNOSIS — I10 Essential (primary) hypertension: Secondary | ICD-10-CM | POA: Diagnosis not present

## 2018-11-18 MED ORDER — ONDANSETRON 4 MG PO TBDP
4.0000 mg | ORAL_TABLET | Freq: Once | ORAL | Status: AC
  Administered 2018-11-18: 4 mg via ORAL

## 2018-11-18 MED ORDER — ONDANSETRON HCL 4 MG PO TABS: 4.0000 mg | ORAL_TABLET | Freq: Three times a day (TID) | ORAL | 0 refills | Status: AC | PRN

## 2018-11-18 MED ORDER — KETOROLAC TROMETHAMINE 60 MG/2ML IM SOLN
INTRAMUSCULAR | Status: AC
  Filled 2018-11-18: qty 2

## 2018-11-18 MED ORDER — ONDANSETRON 4 MG PO TBDP
ORAL_TABLET | ORAL | Status: AC
  Filled 2018-11-18: qty 1

## 2018-11-18 MED ORDER — KETOROLAC TROMETHAMINE 60 MG/2ML IM SOLN
60.0000 mg | Freq: Once | INTRAMUSCULAR | Status: AC
  Administered 2018-11-18: 60 mg via INTRAMUSCULAR

## 2018-11-18 NOTE — Discharge Instructions (Signed)
Drink lots of fluids Take Zofran for the nausea and vomiting When you can tolerate it you may start bland diet Avoid fried foods and spicy foods for at least a week Go to ER if unable to keep down fluids

## 2018-11-18 NOTE — ED Triage Notes (Addendum)
Pt c/o n/v/d body aches chills, pt states she has hx of lupus. Symptoms since yesterday.

## 2018-11-18 NOTE — ED Provider Notes (Signed)
[place]    CSN: [id number] Arrival date & time: 11/18/18  1509     History   Chief Complaint Chief Complaint  Patient presents with  . Diarrhea  . Emesis    HPI ANALY BASSFORD is a 53 y.o. female.   HPI  Patient and her sister are both sick with gastroenteritis.  Dezra has had nausea vomiting diarrhea since yesterday.  She feels very weak.  She cannot keep down even water.  No solid food since yesterday.  Watery diarrhea and stomach cramping.  She is having some chills.  She does not think there was food poisoning.  No recent travel.  No recent antibiotics.  Past Medical History:  Diagnosis Date  . Alpha thalassemia major ([place])   . Anemia   . Arthritis   . Asthma   . Autoimmune disease ([place])   . Blood transfusion   . Chest pain   . Complication of anesthesia   . Hypertension   . Hypothyroidism   . Lupus ([place])   . PONV (postoperative nausea and vomiting)     Patient Active Problem List   Diagnosis Date Noted  . Insomnia 11/17/2015  . GAD (generalized anxiety disorder) 11/17/2015  . Severe episode of recurrent major depressive disorder, without psychotic features ([place]) 11/17/2015  . Morbid obesity ([place]) 03/27/2014  . Right shoulder pain 03/06/2014  . Biliary dyskinesia 07/11/2011  . PAROTIDITIS 10/11/2007  . PITUITARY ADENOMA, BENIGN 07/25/2007  . HYPOTHYROIDISM 05/03/2007  . ANEMIA-IRON DEFICIENCY 05/03/2007    Past Surgical History:  Procedure Laterality Date  . ABDOMINAL HYSTERECTOMY  2009  . CHOLECYSTECTOMY  07/21/11  . KNEE SURGERY  2010   laproscopic right knee  . LAPAROSCOPIC GASTRIC SLEEVE RESECTION N/A 08/19/2014   Procedure: LAPAROSCOPIC GASTRIC SLEEVE RESECTION;  Surgeon: Excell Seltzer, MD;  Location: [facility];  Service: General;  Laterality: N/A;  . UPPER GI ENDOSCOPY  08/19/2014   Procedure: UPPER GI ENDOSCOPY;  Surgeon: Excell Seltzer, MD;  Location: [facility];  Service: General;;    OB History   No obstetric history  on file.      Home Medications    Prior to Admission medications   Medication Sig Start Date End Date Taking? Authorizing Provider  hydroxychloroquine (PLAQUENIL) 200 MG tablet Take 200 mg by mouth 2 (two) times daily.  01/28/14   [provider]  hydrOXYzine (VISTARIL) 25 MG capsule Take 25-50mg  po qHS prn insomnia Patient taking differently: Take 25-50 mg by mouth at bedtime as needed (insomnia).  11/17/15   Charlcie Cradle, MD  IBUPROFEN PO Take by mouth.    [provider]  naproxen (NAPROSYN) 500 MG tablet Take 500 mg by mouth 2 (two) times daily. 01/27/18   [provider]  naproxen sodium (ANAPROX) 220 MG tablet Take 220 mg by mouth 2 (two) times daily as needed (pain). Reported on 11/17/2015    [provider]  ondansetron (ZOFRAN) 4 MG tablet Take 1-2 tablets (4-8 mg total) by mouth every 8 (eight) hours as needed for nausea or vomiting. 11/18/18   Raylene Everts, MD    Family History Family History  Problem Relation Age of Onset  . Depression Mother   . Asthma Other   . Hypertension Other   . Hyperlipidemia Other   . Alcohol abuse Neg Hx   . Anxiety disorder Neg Hx   . Bipolar disorder Neg Hx   . Dementia Neg Hx   . Drug abuse Neg Hx   . Schizophrenia  Neg Hx     Social History Social History   Tobacco Use  . Smoking status: Former Smoker    Last attempt to quit: 11/17/2007    Years since quitting: 11.0  . Smokeless tobacco: Never Used  Substance Use Topics  . Alcohol use: Yes    Comment: socially- a few times a year  . Drug use: No     Allergies   Sulfa antibiotics   Review of Systems Review of Systems  Constitutional: Positive for chills and fatigue. Negative for fever.  HENT: Negative for ear pain and sore throat.   Eyes: Negative for pain and visual disturbance.  Respiratory: Negative for cough and shortness of breath.   Cardiovascular: Negative for chest pain and palpitations.  Gastrointestinal: Positive for  diarrhea, nausea and vomiting. Negative for abdominal pain and blood in stool.  Genitourinary: Negative for dysuria and hematuria.  Musculoskeletal: Negative for arthralgias and back pain.  Skin: Negative for color change and rash.  Neurological: Negative for seizures and syncope.  All other systems reviewed and are negative.    Physical Exam Triage Vital Signs ED Triage Vitals  Enc Vitals Group     BP 11/18/18 1652 (!) 155/122     Pulse Rate 11/18/18 1652 97     Resp 11/18/18 1652 18     Temp 11/18/18 1652 98.5 F (36.9 C)     Temp src --      SpO2 11/18/18 1652 100 %     Weight --      Height --      Head Circumference --      Peak Flow --      Pain Score 11/18/18 1653 9     Pain Loc --      Pain Edu? --      Excl. in [place]? --    No data found.  Updated Vital Signs BP (!) 155/122   Pulse 97   Temp 98.5 F (36.9 C)   Resp 18   SpO2 100%    Physical Exam Constitutional:      General: She is not in acute distress.    Appearance: She is well-developed. She is obese. She is ill-appearing.     Comments: Laying on exam table.  Acts as if is very weak.  Halitosis.  Talks in a whisper  HENT:     Head: Normocephalic and atraumatic.     Mouth/Throat:     Mouth: Mucous membranes are moist.  Eyes:     Conjunctiva/sclera: Conjunctivae normal.     Pupils: Pupils are equal, round, and reactive to light.  Neck:     Musculoskeletal: Normal range of motion and neck supple.  Cardiovascular:     Rate and Rhythm: Normal rate and regular rhythm.     Heart sounds: Normal heart sounds.  Pulmonary:     Effort: Pulmonary effort is normal. No respiratory distress.     Breath sounds: No rhonchi.  Abdominal:     General: Bowel sounds are normal. There is no distension.     Palpations: Abdomen is soft.     Tenderness: There is abdominal tenderness.     Comments: Mild to moderate diffuse tenderness.  No guarding or rebound  Musculoskeletal: Normal range of motion.  Lymphadenopathy:       Cervical: No cervical adenopathy.  Skin:    General: Skin is warm and dry.  Neurological:     General: No focal deficit present.     Mental Status: She  is alert. Mental status is at baseline.      UC Treatments / Results  Labs (all labs ordered are listed, but only abnormal results are displayed) Labs Reviewed - No data to display  EKG None  Radiology No results found.  Procedures Procedures (including critical care time)  Medications Ordered in UC Medications  ondansetron (ZOFRAN-ODT) disintegrating tablet 4 mg (4 mg Oral Given 11/18/18 1656)  ketorolac (TORADOL) injection 60 mg (60 mg Intramuscular Given 11/18/18 1707)    Initial Impression / Assessment and Plan / UC Course  I have reviewed the triage vital signs and the nursing notes.  Pertinent labs & imaging results that were available during my care of the patient were reviewed by me and considered in my medical decision making (see chart for details).     In spite of dramatic appearance, once the Toradol and the Zofran were helpful to patient she sat up and wished to be discharged home.  Discharge instructions were given to patient. Final Clinical Impressions(s) / UC Diagnoses   Final diagnoses:  Gastroenteritis     Discharge Instructions     Drink lots of fluids Take Zofran for the nausea and vomiting When you can tolerate it you may start bland diet Avoid fried foods and spicy foods for at least a week Go to ER if unable to keep down fluids    ED Prescriptions    Medication Sig Dispense Auth. Provider   ondansetron (ZOFRAN) 4 MG tablet Take 1-2 tablets (4-8 mg total) by mouth every 8 (eight) hours as needed for nausea or vomiting. 12 tablet Raylene Everts, MD     Controlled Substance Prescriptions [place] Controlled Substance Registry consulted? Not Applicable   Raylene Everts, MD 11/18/18 [place]

## 2018-11-21 ENCOUNTER — Encounter (HOSPITAL_COMMUNITY): Payer: Self-pay | Admitting: Family Medicine

## 2018-11-21 ENCOUNTER — Emergency Department (HOSPITAL_COMMUNITY)
Admission: EM | Admit: 2018-11-21 | Discharge: 2018-11-21 | Disposition: A | Discharge status: Home / Self Care | Payer: 59 | Attending: Emergency Medicine | Admitting: Emergency Medicine

## 2018-11-21 ENCOUNTER — Emergency Department (HOSPITAL_COMMUNITY): Payer: 59

## 2018-11-21 DIAGNOSIS — I10 Essential (primary) hypertension: Secondary | ICD-10-CM | POA: Diagnosis not present

## 2018-11-21 DIAGNOSIS — K529 Noninfective gastroenteritis and colitis, unspecified: Secondary | ICD-10-CM | POA: Diagnosis not present

## 2018-11-21 DIAGNOSIS — Z79899 Other long term (current) drug therapy: Secondary | ICD-10-CM | POA: Insufficient documentation

## 2018-11-21 DIAGNOSIS — R0602 Shortness of breath: Secondary | ICD-10-CM | POA: Diagnosis not present

## 2018-11-21 DIAGNOSIS — Z87891 Personal history of nicotine dependence: Secondary | ICD-10-CM | POA: Insufficient documentation

## 2018-11-21 DIAGNOSIS — R5383 Other fatigue: Secondary | ICD-10-CM | POA: Diagnosis not present

## 2018-11-21 DIAGNOSIS — R0600 Dyspnea, unspecified: Secondary | ICD-10-CM | POA: Diagnosis not present

## 2018-11-21 DIAGNOSIS — J45909 Unspecified asthma, uncomplicated: Secondary | ICD-10-CM | POA: Insufficient documentation

## 2018-11-21 LAB — I-STAT BETA HCG BLOOD, ED (NOT ORDERABLE): HCG, QUANTITATIVE: 9.3 m[IU]/mL — AB (ref <5)

## 2018-11-21 LAB — COMPREHENSIVE METABOLIC PANEL
ALK PHOS: 75 U/L (ref 38–126)
ALT: 18 U/L (ref 0–44)
ANION GAP: 8 (ref 5–15)
AST: 27 U/L (ref 15–41)
Albumin: 3.9 g/dL (ref 3.5–5.0)
BILIRUBIN TOTAL: 0.6 mg/dL (ref 0.3–1.2)
BUN: 23 mg/dL — ABNORMAL HIGH (ref 6–20)
CALCIUM: 8.5 mg/dL — AB (ref 8.9–10.3)
CO2: 22 mmol/L (ref 22–32)
Chloride: 110 mmol/L (ref 98–111)
Creatinine, Ser: 1 mg/dL (ref 0.44–1.00)
Glucose, Bld: 94 mg/dL (ref 70–99)
POTASSIUM: 3.6 mmol/L (ref 3.5–5.1)
Sodium: 140 mmol/L (ref 135–145)
TOTAL PROTEIN: 8.5 g/dL — AB (ref 6.5–8.1)

## 2018-11-21 LAB — CBC WITH DIFFERENTIAL/PLATELET
Abs Immature Granulocytes: 0.01 10*3/uL (ref 0.00–0.07)
BASOS PCT: 0 %
Basophils Absolute: 0 10*3/uL (ref 0.0–0.1)
EOS ABS: 0.2 10*3/uL (ref 0.0–0.5)
Eosinophils Relative: 3 %
HEMATOCRIT: 35 % — AB (ref 36.0–46.0)
HEMOGLOBIN: 10.3 g/dL — AB (ref 12.0–15.0)
Immature Granulocytes: 0 %
Lymphocytes Relative: 24 %
Lymphs Abs: 1.3 10*3/uL (ref 0.7–4.0)
MCH: 20.9 pg — AB (ref 26.0–34.0)
MCHC: 29.4 g/dL — ABNORMAL LOW (ref 30.0–36.0)
MCV: 71 fL — AB (ref 80.0–100.0)
Monocytes Absolute: 0.5 10*3/uL (ref 0.1–1.0)
Monocytes Relative: 8 %
NEUTROS ABS: 3.6 10*3/uL (ref 1.7–7.7)
NEUTROS PCT: 65 %
NRBC: 0 % (ref 0.0–0.2)
PLATELETS: 186 10*3/uL (ref 150–400)
RBC: 4.93 MIL/uL (ref 3.87–5.11)
RDW: 18 % — ABNORMAL HIGH (ref 11.5–15.5)
WBC: 5.5 10*3/uL (ref 4.0–10.5)

## 2018-11-21 LAB — D-DIMER, QUANTITATIVE: D-Dimer, Quant: 0.4 ug/mL-FEU (ref 0.00–0.50)

## 2018-11-21 LAB — POCT I-STAT TROPONIN I: Troponin i, poc: 0 ng/mL (ref 0.00–0.08)

## 2018-11-21 MED ORDER — LACTATED RINGERS IV BOLUS
1000.0000 mL | Freq: Once | INTRAVENOUS | Status: AC
  Administered 2018-11-21: 1000 mL via INTRAVENOUS

## 2018-11-21 NOTE — ED Provider Notes (Signed)
[facility] DEPT Provider Note   CSN: [id number] Arrival date & time: 11/21/18  1237    History   Chief Complaint Chief Complaint  Patient presents with  . Shortness of Breath    HPI Michelle Huffman is a 53 y.o. female.     52 year old female with past medical history below including alpha thalassemia major, lupus, hypertension, hypothyroidism, asthma who presents with shortness of breath.  Yesterday afternoon while at rest, she began feeling short of breath and her shortness of breath gradually worsened.  She noted that it was worse when she tried to get up and go to the bathroom.  Since that time, she has been feeling short of breath and generally weak.  She does note that several days ago she got sick with a gastrointestinal illness and had vomiting and diarrhea and she is not sure whether her symptoms are related to this illness.  The diarrhea has improved and last episode of vomiting was 2 days ago.  She denies any associated cough, chest pain, fevers, or leg swelling.  She endorses orthopnea.  She denies any recent travel, estrogen use, history of blood clots, history of cancer, or family history of heart disease.  She denies any tobacco, alcohol, or drug use.Nothing makes symptoms better or worse.  The history is provided by the patient.  Shortness of Breath    Past Medical History:  Diagnosis Date  . Alpha thalassemia major ([place])   . Anemia   . Arthritis   . Asthma   . Autoimmune disease ([place])   . Blood transfusion   . Chest pain   . Complication of anesthesia   . Hypertension   . Hypothyroidism   . Lupus ([place])   . PONV (postoperative nausea and vomiting)     Patient Active Problem List   Diagnosis Date Noted  . Insomnia 11/17/2015  . GAD (generalized anxiety disorder) 11/17/2015  . Severe episode of recurrent major depressive disorder, without psychotic features ([place]) 11/17/2015  . Morbid obesity ([place]) 03/27/2014  . Right  shoulder pain 03/06/2014  . Biliary dyskinesia 07/11/2011  . PAROTIDITIS 10/11/2007  . PITUITARY ADENOMA, BENIGN 07/25/2007  . HYPOTHYROIDISM 05/03/2007  . ANEMIA-IRON DEFICIENCY 05/03/2007    Past Surgical History:  Procedure Laterality Date  . ABDOMINAL HYSTERECTOMY  2009  . CHOLECYSTECTOMY  07/21/11  . KNEE SURGERY  2010   laproscopic right knee  . LAPAROSCOPIC GASTRIC SLEEVE RESECTION N/A 08/19/2014   Procedure: LAPAROSCOPIC GASTRIC SLEEVE RESECTION;  Surgeon: Excell Seltzer, MD;  [facility];  Service: General;  Laterality: N/A;  . UPPER GI ENDOSCOPY  08/19/2014   Procedure: UPPER GI ENDOSCOPY;  Surgeon: Excell Seltzer, MD;  Location: [facility];  Service: General;;     OB History   No obstetric history on file.      Home Medications    Prior to Admission medications   Medication Sig Start Date End Date Taking? Authorizing Provider  albuterol (PROVENTIL HFA;VENTOLIN HFA) 108 (90 Base) MCG/ACT inhaler Inhale 2 puffs into the lungs every 6 (six) hours as needed for shortness of breath.   Yes [provider]  hydroxychloroquine (PLAQUENIL) 200 MG tablet Take 200 mg by mouth 2 (two) times daily.  01/28/14  Yes [provider]  hydrOXYzine (VISTARIL) 25 MG capsule Take 25-50mg  po qHS prn insomnia Patient taking differently: Take 25-50 mg by mouth at bedtime as needed (insomnia).  11/17/15  Yes Charlcie Cradle, MD  ibuprofen (ADVIL,MOTRIN) 800 MG tablet Take 800 mg  by mouth every 8 (eight) hours as needed for headache or moderate pain.   Yes [provider]  mometasone-formoterol (DULERA) 100-5 MCG/ACT AERO Inhale 2 puffs into the lungs 2 (two) times daily. 04/03/18  Yes [provider]  naproxen sodium (ANAPROX) 220 MG tablet Take 220 mg by mouth 2 (two) times daily as needed (pain). Reported on 11/17/2015   Yes [provider]  ondansetron (ZOFRAN) 4 MG tablet Take 1-2 tablets (4-8 mg total) by mouth every 8 (eight) hours as needed  for nausea or vomiting. 11/18/18  Yes Raylene Everts, MD    Family History Family History  Problem Relation Age of Onset  . Depression Mother   . Asthma Other   . Hypertension Other   . Hyperlipidemia Other   . Alcohol abuse Neg Hx   . Anxiety disorder Neg Hx   . Bipolar disorder Neg Hx   . Dementia Neg Hx   . Drug abuse Neg Hx   . Schizophrenia Neg Hx     Social History Social History   Tobacco Use  . Smoking status: Former Smoker    Last attempt to quit: 11/17/2007    Years since quitting: 11.0  . Smokeless tobacco: Never Used  Substance Use Topics  . Alcohol use: Yes    Comment: socially- a few times a year  . Drug use: No     Allergies   Sulfa antibiotics   Review of Systems Review of Systems  Respiratory: Positive for shortness of breath.    All other systems reviewed and are negative except that which was mentioned in HPI   Physical Exam Updated Vital Signs BP (!) 112/94   Pulse 89   Temp 98.9 F (37.2 C)   Resp (!) 27   Ht 5\' 4"  (1.626 m)   Wt 97.5 kg   SpO2 97%   BMI 36.90 kg/m   Physical Exam Vitals signs and nursing note reviewed.  Constitutional:      General: She is not in acute distress.    Appearance: She is well-developed.  HENT:     Head: Normocephalic and atraumatic.  Eyes:     Conjunctiva/sclera: Conjunctivae normal.  Neck:     Musculoskeletal: Neck supple.  Cardiovascular:     Rate and Rhythm: Normal rate and regular rhythm.     Heart sounds: Normal heart sounds. No murmur.  Pulmonary:     Effort: Pulmonary effort is normal. No tachypnea.     Breath sounds: Normal breath sounds. No wheezing.  Abdominal:     General: Bowel sounds are normal. There is no distension.     Palpations: Abdomen is soft.     Tenderness: There is no abdominal tenderness.  Musculoskeletal:     Right lower leg: No edema.     Left lower leg: No edema.  Skin:    General: Skin is warm and dry.  Neurological:     Mental Status: She is alert and  oriented to person, place, and time.     Comments: Fluent speech  Psychiatric:        Judgment: Judgment normal.      ED Treatments / Results  Labs (all labs ordered are listed, but only abnormal results are displayed) Labs Reviewed  COMPREHENSIVE METABOLIC PANEL - Abnormal; Notable for the following components:      Result Value   BUN 23 (*)    Calcium 8.5 (*)    Total Protein 8.5 (*)    All other components  within normal limits  CBC WITH DIFFERENTIAL/PLATELET - Abnormal; Notable for the following components:   Hemoglobin 10.3 (*)    HCT 35.0 (*)    MCV 71.0 (*)    MCH 20.9 (*)    MCHC 29.4 (*)    RDW 18.0 (*)    All other components within normal limits  I-STAT BETA HCG BLOOD, ED (NOT ORDERABLE) - Abnormal; Notable for the following components:   I-stat hCG, quantitative 9.3 (*)    All other components within normal limits  D-DIMER, QUANTITATIVE (NOT AT [facility])  I-STAT BETA HCG BLOOD, ED ([facility], [facility], [facility] ONLY)  I-STAT TROPONIN, ED  POCT I-STAT TROPONIN I    EKG EKG Interpretation  Date/Time:  Wednesday November 21 2018 12:55:08 EST Ventricular Rate:  94 PR Interval:    QRS Duration: 108 QT Interval:  386 QTC Calculation: 483 R Axis:   25 Text Interpretation:  Sinus rhythm Borderline T abnormalities, diffuse leads similar to previous Confirmed by Theotis Burrow [phone]) on 11/21/2018 1:34:21 PM   Radiology Dg Chest 2 View  Result Date: 11/21/2018 CLINICAL DATA:  Generalized weakness recent gastroenteritis EXAM: CHEST - 2 VIEW COMPARISON:  03/15/2018 FINDINGS: The heart size and mediastinal contours are within normal limits. Both lungs are clear. The visualized skeletal structures are unremarkable. IMPRESSION: Normal chest radiograph. Electronically Signed   By: Suzy Bouchard M.D.   On: 11/21/2018 13:43    Procedures Procedures (including critical care time)  Medications Ordered in ED Medications  lactated ringers bolus 1,000 mL (1,000 mLs Intravenous New  Bag/Given 11/21/18 1458)     Initial Impression / Assessment and Plan / ED Course  I have reviewed the triage vital signs and the nursing notes.  Pertinent labs & imaging results that were available during my care of the patient were reviewed by me and considered in my medical decision making (see chart for details).        Nontoxic on exam, normal vital signs, clear breath sounds.  Chest x-ray normal.  No history of heart disease or symptoms to suggest CHF.  Because of relatively sudden onset and history of lupus, obtained d-dimer to evaluate for PE. D-dimer negative. CMP and CBC reassuring. Hbg 10.3 but no acute bleeding and normal VS, I do not feel this is explanation for her symptoms. Trop normal and EKG reassuring. Sx not suggestive of ACS.  I suspect she may have some fatigue and deconditioning from her recent GI illness.  I have discussed supportive measures and instructed her to follow closely with her PCP.  Extensively reviewed return precautions and she voiced understanding. Final Clinical Impressions(s) / ED Diagnoses   Final diagnoses:  Dyspnea, unspecified type  Fatigue, unspecified type    ED Discharge Orders    None       Little, Wenda Overland, MD 11/21/18 1539

## 2018-11-21 NOTE — ED Triage Notes (Signed)
Patient is complaining of shortness of breath that started all of a sudden yesterday. Describes it as consist despite her respirations are even, regular, and unlabored. Denies chest pain. Patient reports she was at [facility] on Saturday, dx with gastroenteritis. Reports the nausea, vomiting, and diarrhea have improved.

## 2019-01-17 DIAGNOSIS — M549 Dorsalgia, unspecified: Secondary | ICD-10-CM | POA: Diagnosis not present

## 2019-01-17 DIAGNOSIS — J45909 Unspecified asthma, uncomplicated: Secondary | ICD-10-CM | POA: Diagnosis not present

## 2019-01-17 DIAGNOSIS — I10 Essential (primary) hypertension: Secondary | ICD-10-CM | POA: Diagnosis not present

## 2019-01-17 DIAGNOSIS — M359 Systemic involvement of connective tissue, unspecified: Secondary | ICD-10-CM | POA: Diagnosis not present

## 2019-01-17 DIAGNOSIS — M329 Systemic lupus erythematosus, unspecified: Secondary | ICD-10-CM | POA: Diagnosis not present

## 2019-02-11 DIAGNOSIS — I10 Essential (primary) hypertension: Secondary | ICD-10-CM | POA: Diagnosis not present

## 2019-02-11 DIAGNOSIS — R05 Cough: Secondary | ICD-10-CM | POA: Diagnosis not present

## 2019-02-11 DIAGNOSIS — Z03818 Encounter for observation for suspected exposure to other biological agents ruled out: Secondary | ICD-10-CM | POA: Diagnosis not present

## 2019-03-19 DIAGNOSIS — M549 Dorsalgia, unspecified: Secondary | ICD-10-CM | POA: Diagnosis not present

## 2019-03-19 DIAGNOSIS — E669 Obesity, unspecified: Secondary | ICD-10-CM | POA: Diagnosis not present

## 2019-03-19 DIAGNOSIS — I7300 Raynaud's syndrome without gangrene: Secondary | ICD-10-CM | POA: Diagnosis not present

## 2019-03-19 DIAGNOSIS — Z79899 Other long term (current) drug therapy: Secondary | ICD-10-CM | POA: Diagnosis not present

## 2019-03-19 DIAGNOSIS — M329 Systemic lupus erythematosus, unspecified: Secondary | ICD-10-CM | POA: Diagnosis not present

## 2019-04-10 DIAGNOSIS — M47814 Spondylosis without myelopathy or radiculopathy, thoracic region: Secondary | ICD-10-CM | POA: Diagnosis not present

## 2019-04-10 DIAGNOSIS — M546 Pain in thoracic spine: Secondary | ICD-10-CM | POA: Diagnosis not present

## 2019-06-18 DIAGNOSIS — M545 Low back pain: Secondary | ICD-10-CM | POA: Diagnosis not present

## 2019-06-18 DIAGNOSIS — M799 Soft tissue disorder, unspecified: Secondary | ICD-10-CM | POA: Diagnosis not present

## 2019-06-18 DIAGNOSIS — M6281 Muscle weakness (generalized): Secondary | ICD-10-CM | POA: Diagnosis not present

## 2019-06-18 DIAGNOSIS — M2560 Stiffness of unspecified joint, not elsewhere classified: Secondary | ICD-10-CM | POA: Diagnosis not present

## 2019-06-26 DIAGNOSIS — Z1239 Encounter for other screening for malignant neoplasm of breast: Secondary | ICD-10-CM | POA: Diagnosis not present

## 2019-06-26 DIAGNOSIS — Z1231 Encounter for screening mammogram for malignant neoplasm of breast: Secondary | ICD-10-CM | POA: Diagnosis not present

## 2019-11-14 IMAGING — CR DG CHEST 2V
2 series · 2 of 2 positions shown · non-contrast
Comparison: 03/15/2018

CLINICAL DATA: Generalized weakness recent gastroenteritis

EXAM:
CHEST - 2 VIEW

[w chest pa]
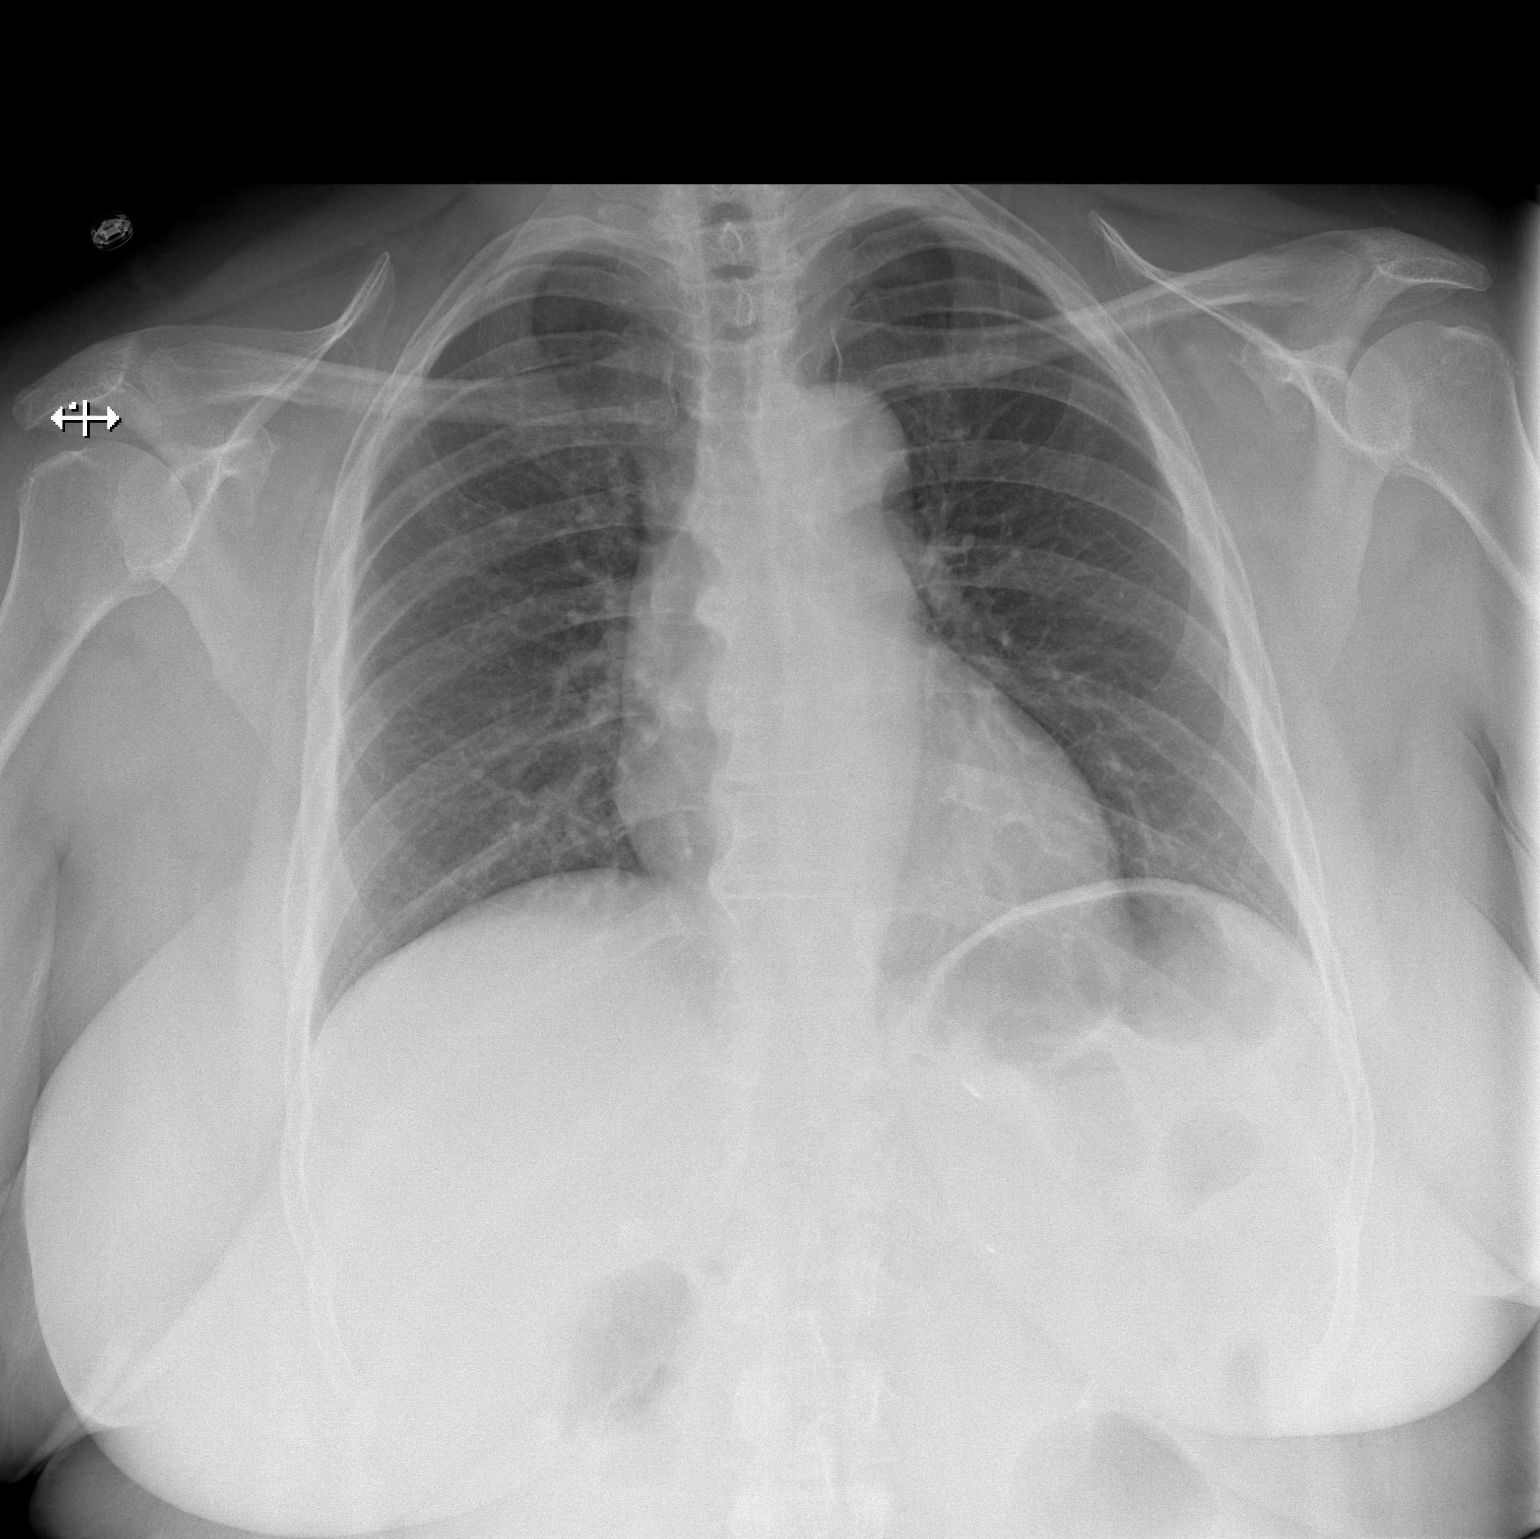

[w chest lat]
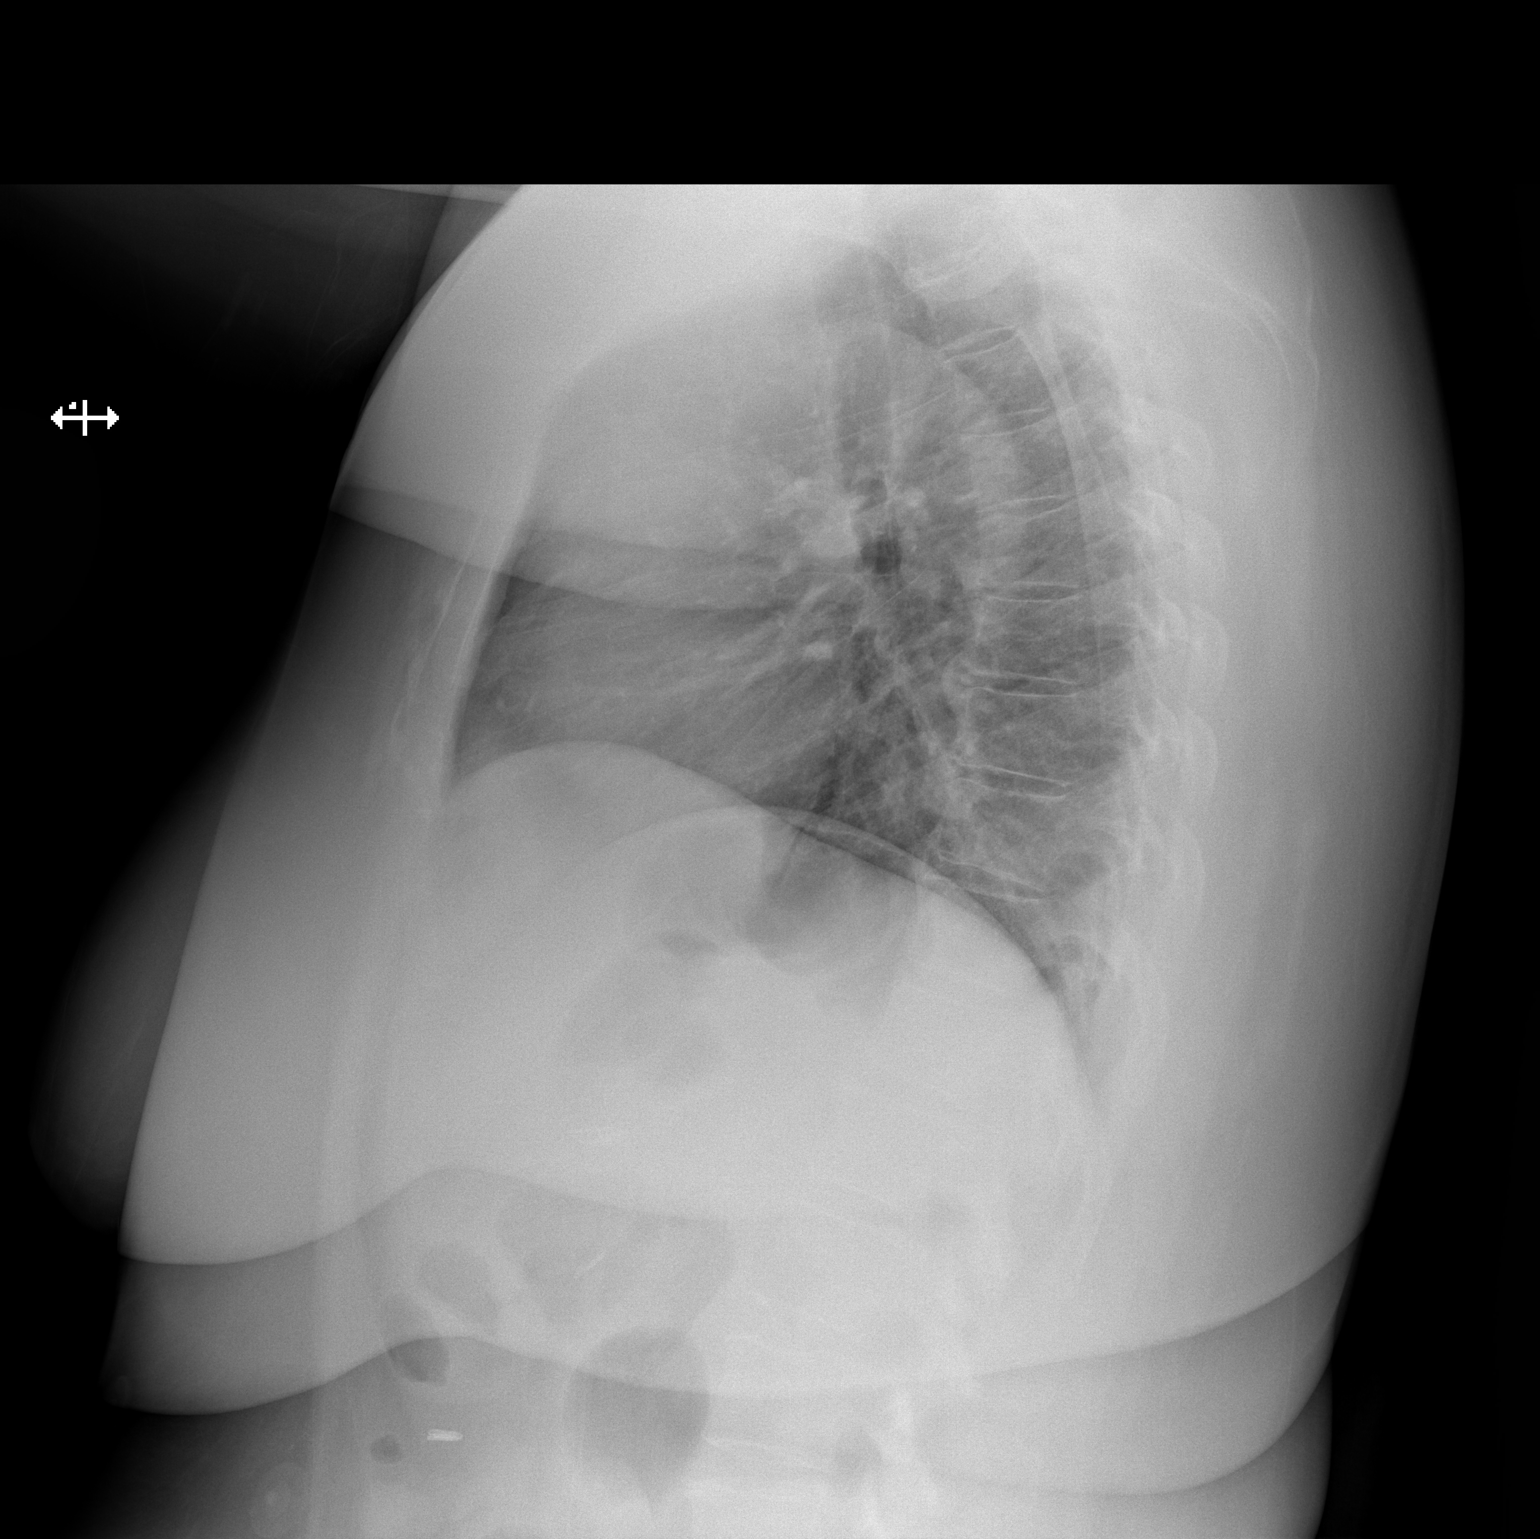

[2 of 2 positions shown; findings below may reference images not displayed]

FINDINGS: The heart size and mediastinal contours are within normal limits.
Both lungs are clear. The visualized skeletal structures are
unremarkable.
IMPRESSION: Normal chest radiograph.

## 2020-06-03 ENCOUNTER — Other Ambulatory Visit: Payer: Self-pay

## 2021-02-11 DIAGNOSIS — R609 Edema, unspecified: Secondary | ICD-10-CM | POA: Diagnosis not present

## 2021-02-11 DIAGNOSIS — E785 Hyperlipidemia, unspecified: Secondary | ICD-10-CM | POA: Diagnosis not present

## 2021-02-11 DIAGNOSIS — G4700 Insomnia, unspecified: Secondary | ICD-10-CM | POA: Diagnosis not present

## 2021-02-11 DIAGNOSIS — I10 Essential (primary) hypertension: Secondary | ICD-10-CM | POA: Diagnosis not present

## 2021-02-11 DIAGNOSIS — F4321 Adjustment disorder with depressed mood: Secondary | ICD-10-CM | POA: Diagnosis not present

## 2021-02-11 DIAGNOSIS — R600 Localized edema: Secondary | ICD-10-CM | POA: Diagnosis not present

## 2021-02-11 DIAGNOSIS — E039 Hypothyroidism, unspecified: Secondary | ICD-10-CM | POA: Diagnosis not present

## 2021-02-11 DIAGNOSIS — M359 Systemic involvement of connective tissue, unspecified: Secondary | ICD-10-CM | POA: Diagnosis not present

## 2021-02-11 DIAGNOSIS — M329 Systemic lupus erythematosus, unspecified: Secondary | ICD-10-CM | POA: Diagnosis not present

## 2021-02-24 DIAGNOSIS — M329 Systemic lupus erythematosus, unspecified: Secondary | ICD-10-CM | POA: Diagnosis not present

## 2021-02-24 DIAGNOSIS — Z79899 Other long term (current) drug therapy: Secondary | ICD-10-CM | POA: Diagnosis not present

## 2021-02-24 DIAGNOSIS — I7300 Raynaud's syndrome without gangrene: Secondary | ICD-10-CM | POA: Diagnosis not present

## 2021-02-24 DIAGNOSIS — E669 Obesity, unspecified: Secondary | ICD-10-CM | POA: Diagnosis not present

## 2021-03-09 DIAGNOSIS — H40019 Open angle with borderline findings, low risk, unspecified eye: Secondary | ICD-10-CM | POA: Diagnosis not present

## 2021-03-31 DIAGNOSIS — M329 Systemic lupus erythematosus, unspecified: Secondary | ICD-10-CM | POA: Diagnosis not present

## 2021-03-31 DIAGNOSIS — J45909 Unspecified asthma, uncomplicated: Secondary | ICD-10-CM | POA: Diagnosis not present

## 2021-03-31 DIAGNOSIS — Z0289 Encounter for other administrative examinations: Secondary | ICD-10-CM | POA: Diagnosis not present

## 2021-03-31 DIAGNOSIS — E782 Mixed hyperlipidemia: Secondary | ICD-10-CM | POA: Diagnosis not present

## 2021-03-31 DIAGNOSIS — M069 Rheumatoid arthritis, unspecified: Secondary | ICD-10-CM | POA: Diagnosis not present

## 2021-03-31 DIAGNOSIS — Z0000 Encounter for general adult medical examination without abnormal findings: Secondary | ICD-10-CM | POA: Diagnosis not present

## 2021-04-09 DIAGNOSIS — H40019 Open angle with borderline findings, low risk, unspecified eye: Secondary | ICD-10-CM | POA: Diagnosis not present

## 2021-04-26 DIAGNOSIS — B079 Viral wart, unspecified: Secondary | ICD-10-CM | POA: Diagnosis not present

## 2021-04-26 DIAGNOSIS — L989 Disorder of the skin and subcutaneous tissue, unspecified: Secondary | ICD-10-CM | POA: Diagnosis not present

## 2021-05-07 DIAGNOSIS — U071 COVID-19: Secondary | ICD-10-CM | POA: Diagnosis not present

## 2021-06-01 ENCOUNTER — Ambulatory Visit
Admission: EM | Admit: 2021-06-01 | Discharge: 2021-06-01 | Disposition: A | Discharge status: Home / Self Care | Payer: 59

## 2021-06-01 ENCOUNTER — Other Ambulatory Visit (HOSPITAL_COMMUNITY): Payer: Self-pay

## 2021-06-01 ENCOUNTER — Other Ambulatory Visit: Payer: Self-pay

## 2021-06-01 DIAGNOSIS — R42 Dizziness and giddiness: Secondary | ICD-10-CM | POA: Diagnosis not present

## 2021-06-01 DIAGNOSIS — Z760 Encounter for issue of repeat prescription: Secondary | ICD-10-CM

## 2021-06-01 DIAGNOSIS — I10 Essential (primary) hypertension: Secondary | ICD-10-CM | POA: Diagnosis not present

## 2021-06-01 MED ORDER — HYDROCHLOROTHIAZIDE 25 MG PO TABS
25.0000 mg | ORAL_TABLET | Freq: Every day | ORAL | 0 refills | Status: DC
  Filled 2021-06-01: qty 30, 30d supply, fill #0

## 2021-06-01 MED ORDER — METOPROLOL SUCCINATE ER 25 MG PO TB24
25.0000 mg | ORAL_TABLET | Freq: Every day | ORAL | 0 refills | Status: DC
  Filled 2021-06-01: qty 30, 30d supply, fill #0

## 2021-06-01 NOTE — ED Triage Notes (Addendum)
Pt c/o blurry vision yesterday, unsteady gait (believes she got over heated). At home BP was 160/114 yesterday per patient. Denies headache, ear pain/ changes in hearing. Pt states she needs a refill of HCTZ and Metoprolol. Started: yesterday

## 2021-06-01 NOTE — Discharge Instructions (Addendum)
Please be sure to drink plenty of fluids and stay hydrated Restart taking hydrochlorothiazide and metoprolol as prescribed Continue to monitor blood pressure, heart rate as well as blood sugar especially when you are having symptoms Please go to emergency room if developing severe headache, vision changes, worsening dizziness, one-sided weakness, difficulty speaking

## 2021-06-01 NOTE — ED Provider Notes (Signed)
[facility]    CSN: [id number] Arrival date & time: 06/01/21  0957      History   Chief Complaint Chief Complaint  Patient presents with   Dizziness    HPI Michelle Huffman is a 55 y.o. female history of hypertension, lupus, presenting today for evaluation of dizziness.  Reports that yesterday afternoon she began to feel hot as well as slight blurred vision and feeling off balance.  Reports that after sitting down and with air blowing on her she felt slightly improved but continued to feel off later that evening.  While at work she attempted to eat food and still feels slightly improved, but not 100%.  Checked her blood pressure at home was noted to be 180/116.  Has been off of HCTZ and metoprolol for approximately 2 months.  Took Lasix last night.  Today she denies any headache vision changes difficulty speaking.  Denies any dizziness or lightheadedness today.  Denies any chest pain.  HPI  Past Medical History:  Diagnosis Date   Alpha thalassemia major ([place])    Anemia    Arthritis    Asthma    Autoimmune disease ([place])    Blood transfusion    Chest pain    Complication of anesthesia    Hypertension    Hypothyroidism    Lupus ([place])    PONV (postoperative nausea and vomiting)     Patient Active Problem List   Diagnosis Date Noted   Insomnia 11/17/2015   GAD (generalized anxiety disorder) 11/17/2015   Severe episode of recurrent major depressive disorder, without psychotic features ([place]) 11/17/2015   Morbid obesity ([place]) 03/27/2014   Right shoulder pain 03/06/2014   Biliary dyskinesia 07/11/2011   PAROTIDITIS 10/11/2007   PITUITARY ADENOMA, BENIGN 07/25/2007   HYPOTHYROIDISM 05/03/2007   ANEMIA-IRON DEFICIENCY 05/03/2007    Past Surgical History:  Procedure Laterality Date   ABDOMINAL HYSTERECTOMY  2009   CHOLECYSTECTOMY  07/21/11   KNEE SURGERY  2010   laproscopic right knee   LAPAROSCOPIC GASTRIC SLEEVE RESECTION N/A 08/19/2014   Procedure:  LAPAROSCOPIC GASTRIC SLEEVE RESECTION;  Surgeon: Excell Seltzer, MD;  Location: [facility];  Service: General;  Laterality: N/A;   UPPER GI ENDOSCOPY  08/19/2014   Procedure: UPPER GI ENDOSCOPY;  Surgeon: Excell Seltzer, MD;  Location: [facility];  Service: General;;    OB History   No obstetric history on file.      Home Medications    Prior to Admission medications   Medication Sig Start Date End Date Taking? Authorizing Provider  furosemide (LASIX) 20 MG tablet Take 20 mg by mouth daily. 02/11/21  Yes [provider]  pravastatin (PRAVACHOL) 20 MG tablet Take by mouth. 02/11/21  Yes [provider]  albuterol (PROVENTIL HFA;VENTOLIN HFA) 108 (90 Base) MCG/ACT inhaler Inhale 2 puffs into the lungs every 6 (six) hours as needed for shortness of breath.    [provider]  hydrochlorothiazide (HYDRODIURIL) 25 MG tablet Take 1 tablet (25 mg total) by mouth daily. 06/01/21   Eustacio Ellen C, PA-C  hydroxychloroquine (PLAQUENIL) 200 MG tablet Take 200 mg by mouth 2 (two) times daily.  01/28/14   [provider]  hydrOXYzine (VISTARIL) 25 MG capsule Take 25-'50mg'$  po qHS prn insomnia Patient taking differently: Take 25-50 mg by mouth at bedtime as needed (insomnia).  11/17/15   Charlcie Cradle, MD  ibuprofen (ADVIL,MOTRIN) 800 MG tablet Take 800 mg by mouth every 8 (eight) hours as needed for headache or moderate pain.  [provider]  metoprolol succinate (TOPROL-XL) 25 MG 24 hr tablet Take 1 tablet (25 mg total) by mouth daily. 06/01/21   Tiffaney Heimann C, PA-C  mometasone-formoterol (DULERA) 100-5 MCG/ACT AERO Inhale 2 puffs into the lungs 2 (two) times daily. 04/03/18   [provider]  naproxen sodium (ANAPROX) 220 MG tablet Take 220 mg by mouth 2 (two) times daily as needed (pain). Reported on 11/17/2015    [provider]  ondansetron (ZOFRAN) 4 MG tablet Take 1-2 tablets (4-8 mg total) by mouth every 8 (eight) hours as needed for  nausea or vomiting. 11/18/18   Raylene Everts, MD    Family History Family History  Problem Relation Age of Onset   Depression Mother    Asthma Other    Hypertension Other    Hyperlipidemia Other    Alcohol abuse Neg Hx    Anxiety disorder Neg Hx    Bipolar disorder Neg Hx    Dementia Neg Hx    Drug abuse Neg Hx    Schizophrenia Neg Hx     Social History Social History   Tobacco Use   Smoking status: Former    Types: Cigarettes    Quit date: 11/17/2007    Years since quitting: 13.5   Smokeless tobacco: Never  Vaping Use   Vaping Use: Never used  Substance Use Topics   Alcohol use: Yes    Comment: socially- a few times a year   Drug use: No     Allergies   Sulfa antibiotics   Review of Systems Review of Systems  Constitutional:  Negative for fatigue and fever.  HENT:  Negative for congestion, sinus pressure and sore throat.   Eyes:  Negative for photophobia, pain and visual disturbance.  Respiratory:  Negative for cough and shortness of breath.   Cardiovascular:  Negative for chest pain.  Gastrointestinal:  Negative for abdominal pain, nausea and vomiting.  Genitourinary:  Negative for decreased urine volume and hematuria.  Musculoskeletal:  Negative for myalgias, neck pain and neck stiffness.  Neurological:  Positive for dizziness. Negative for syncope, facial asymmetry, speech difficulty, weakness, light-headedness, numbness and headaches.    Physical Exam Triage Vital Signs ED Triage Vitals  Enc Vitals Group     BP      Pulse      Resp      Temp      Temp src      SpO2      Weight      Height      Head Circumference      Peak Flow      Pain Score      Pain Loc      Pain Edu?      Excl. in [place]?    No data found.  Updated Vital Signs BP 125/80 (BP Location: Right Arm)   Pulse 66   Temp 98.3 F (36.8 C) (Oral)   Resp 20   SpO2 98%   Visual Acuity Right Eye Distance:   Left Eye Distance:   Bilateral Distance:    Right Eye Near:    Left Eye Near:    Bilateral Near:     Physical Exam Vitals and nursing note reviewed.  Constitutional:      Appearance: She is well-developed.     Comments: No acute distress  HENT:     Head: Normocephalic and atraumatic.     Nose: Nose normal.     Mouth/Throat:  Comments: Oral mucosa pink and moist, no tonsillar enlargement or exudate. Posterior pharynx patent and nonerythematous, no uvula deviation or swelling. Normal phonation.  Eyes:     Extraocular Movements: Extraocular movements intact.     Conjunctiva/sclera: Conjunctivae normal.     Pupils: Pupils are equal, round, and reactive to light.  Cardiovascular:     Rate and Rhythm: Normal rate and regular rhythm.  Pulmonary:     Effort: Pulmonary effort is normal. No respiratory distress.     Comments: Breathing comfortably at rest, CTABL, no wheezing, rales or other adventitious sounds auscultated  Abdominal:     General: There is no distension.  Musculoskeletal:        General: Normal range of motion.     Cervical back: Neck supple.  Skin:    General: Skin is warm and dry.  Neurological:     General: No focal deficit present.     Mental Status: She is alert and oriented to person, place, and time. Mental status is at baseline.     Cranial Nerves: No cranial nerve deficit.     Motor: No weakness.     Gait: Gait normal.     Comments: negative romberg     UC Treatments / Results  Labs (all labs ordered are listed, but only abnormal results are displayed) Labs Reviewed - No data to display  EKG   Radiology No results found.  Procedures Procedures (including critical care time)  Medications Ordered in UC Medications - No data to display  Initial Impression / Assessment and Plan / UC Course  I have reviewed the triage vital signs and the nursing notes.  Pertinent labs & imaging results that were available during my care of the patient were reviewed by me and considered in my medical decision making (see  chart for details).     Dizziness-symptoms yesterday overall subsided, blood pressure improved today, no neurodeficits on exam, recommended continuing to stay hydrated, will refill HCTZ and metoprolol while closely monitoring pressure and heart rate, advised patient to monitor to make sure pressure not on the lower end prior to taking medicines.  Follow-up with PCP for further refills of medicines.  Monitor for complete resolution of symptoms.  Discussed warning signs to go to emergency room for.  Discussed strict return precautions. Patient verbalized understanding and is agreeable with plan.  Final Clinical Impressions(s) / UC Diagnoses   Final diagnoses:  Dizziness  Essential hypertension  Medication refill     Discharge Instructions      Please be sure to drink plenty of fluids and stay hydrated Restart taking hydrochlorothiazide and metoprolol as prescribed Continue to monitor blood pressure, heart rate as well as blood sugar especially when you are having symptoms Please go to emergency room if developing severe headache, vision changes, worsening dizziness, one-sided weakness, difficulty speaking     ED Prescriptions     Medication Sig Dispense Auth. Provider   hydrochlorothiazide (HYDRODIURIL) 25 MG tablet Take 1 tablet (25 mg total) by mouth daily. 30 tablet Corah Willeford C, PA-C   metoprolol succinate (TOPROL-XL) 25 MG 24 hr tablet Take 1 tablet (25 mg total) by mouth daily. 30 tablet Herchel Hopkin, [place] C, PA-C      PDMP not reviewed this encounter.   Janith Lima, PA-C 06/01/21 1056

## 2021-06-17 DIAGNOSIS — U071 COVID-19: Secondary | ICD-10-CM | POA: Diagnosis not present

## 2021-06-21 DIAGNOSIS — U071 COVID-19: Secondary | ICD-10-CM | POA: Diagnosis not present

## 2021-06-22 DIAGNOSIS — M069 Rheumatoid arthritis, unspecified: Secondary | ICD-10-CM | POA: Diagnosis not present

## 2021-06-22 DIAGNOSIS — M329 Systemic lupus erythematosus, unspecified: Secondary | ICD-10-CM | POA: Diagnosis not present

## 2021-06-22 DIAGNOSIS — U071 COVID-19: Secondary | ICD-10-CM | POA: Diagnosis not present

## 2021-06-22 DIAGNOSIS — R059 Cough, unspecified: Secondary | ICD-10-CM | POA: Diagnosis not present

## 2021-12-04 LAB — HM MAMMOGRAPHY

## 2022-02-21 LAB — COMPREHENSIVE METABOLIC PANEL
Albumin: 4.2 (ref 3.5–5.0)
Calcium: 9.1 (ref 8.7–10.7)
Globulin: 3.7
eGFR: 92

## 2022-02-21 LAB — CBC AND DIFFERENTIAL
HCT: 32 — AB (ref 36–46)
Hemoglobin: 9.1 — AB (ref 12.0–16.0)
Neutrophils Absolute: 2.2
Platelets: 270 10*3/uL (ref 150–400)
WBC: 3.9

## 2022-02-21 LAB — BASIC METABOLIC PANEL
BUN: 13 (ref 4–21)
CO2: 20 (ref 13–22)
Chloride: 108 (ref 99–108)
Creatinine: 0.8 (ref 0.5–1.1)
Glucose: 87
Potassium: 4.3 mEq/L (ref 3.5–5.1)
Sodium: 142 (ref 137–147)

## 2022-02-21 LAB — HEPATIC FUNCTION PANEL
ALT: 13 U/L (ref 7–35)
AST: 18 (ref 13–35)
Alkaline Phosphatase: 92 (ref 25–125)
Bilirubin, Total: 0.3

## 2022-02-21 LAB — HEMOGLOBIN A1C: Hemoglobin A1C: 5.8

## 2022-02-21 LAB — IRON,TIBC AND FERRITIN PANEL
%SAT: 18
Ferritin: 84
Iron: 55
TIBC: 298
UIBC: 243

## 2022-02-21 LAB — LIPID PANEL
Cholesterol: 220 — AB (ref 0–200)
HDL: 36 (ref 35–70)
LDL Cholesterol: 162
Triglycerides: 123 (ref 40–160)

## 2022-02-21 LAB — CBC: RBC: 4.61 (ref 3.87–5.11)

## 2022-02-21 LAB — VITAMIN B12: Vitamin B-12: 400

## 2022-02-21 LAB — LAB REPORT - SCANNED: Folate: 4.3

## 2022-07-20 ENCOUNTER — Ambulatory Visit (INDEPENDENT_AMBULATORY_CARE_PROVIDER_SITE_OTHER): Payer: 59 | Admitting: Sports Medicine

## 2022-07-20 DIAGNOSIS — M329 Systemic lupus erythematosus, unspecified: Secondary | ICD-10-CM | POA: Diagnosis not present

## 2022-07-20 DIAGNOSIS — M25361 Other instability, right knee: Secondary | ICD-10-CM

## 2022-07-20 DIAGNOSIS — M722 Plantar fascial fibromatosis: Secondary | ICD-10-CM

## 2022-07-20 DIAGNOSIS — M1711 Unilateral primary osteoarthritis, right knee: Secondary | ICD-10-CM | POA: Insufficient documentation

## 2022-07-20 MED ORDER — MELOXICAM 15 MG PO TABS: ORAL_TABLET | ORAL | 3 refills | Status: DC

## 2022-07-20 NOTE — Progress Notes (Signed)
    Procedures performed today:    None.  Independent interpretation of notes and tests performed by another provider:   None.  Brief History, Exam, Impression, and Recommendations:    Plantar fascial fibromatosis of right foot Several weeks of a painful knot plantar aspect of the foot adjacent to the mid plantar fascia. Suspect small hematoma versus plantar fascial fibromatosis, she will wear supportive shoes throughout the day and night, no barefoot walking. Meloxicam as above, x-rays, plantar fascial rehab exercises, return to see me in 4 to 6 weeks for this.  Knee instability, right Also has noted several months of increasing pain right knee, anterior aspect.  She does have a history of a surgery but is unsure of what type. On exam she does not have any discrete areas of tenderness to palpation but she does have a very positive Lachman sign. I do have a concern for chronic ACL tear, there is also likely some patellofemoral chondromalacia/arthritis. Adding x-rays, meloxicam, due to knee instability I would like an MRI. Return to see me in 6 weeks.  Chronic process with exacerbation and pharmacologic intervention  ____________________________________________ Gwen Her. Dianah Field, M.D., ABFM., CAQSM., AME. Primary Care and Sports Medicine  [facility]  Adjunct Professor of [place] of [facility] of Medicine  Risk manager

## 2022-07-20 NOTE — Assessment & Plan Note (Signed)
Also has noted several months of increasing pain right knee, anterior aspect.  She does have a history of a surgery but is unsure of what type. On exam she does not have any discrete areas of tenderness to palpation but she does have a very positive Lachman sign. I do have a concern for chronic ACL tear, there is also likely some patellofemoral chondromalacia/arthritis. Adding x-rays, meloxicam, due to knee instability I would like an MRI. Return to see me in 6 weeks.

## 2022-07-20 NOTE — Assessment & Plan Note (Signed)
Several weeks of a painful knot plantar aspect of the foot adjacent to the mid plantar fascia. Suspect small hematoma versus plantar fascial fibromatosis, she will wear supportive shoes throughout the day and night, no barefoot walking. Meloxicam as above, x-rays, plantar fascial rehab exercises, return to see me in 4 to 6 weeks for this.

## 2022-07-21 ENCOUNTER — Ambulatory Visit (INDEPENDENT_AMBULATORY_CARE_PROVIDER_SITE_OTHER): Payer: 59

## 2022-07-21 DIAGNOSIS — M722 Plantar fascial fibromatosis: Secondary | ICD-10-CM

## 2022-07-21 DIAGNOSIS — M25562 Pain in left knee: Secondary | ICD-10-CM | POA: Diagnosis not present

## 2022-07-21 DIAGNOSIS — Z09 Encounter for follow-up examination after completed treatment for conditions other than malignant neoplasm: Secondary | ICD-10-CM

## 2022-07-21 DIAGNOSIS — M25361 Other instability, right knee: Secondary | ICD-10-CM

## 2022-07-21 DIAGNOSIS — G8929 Other chronic pain: Secondary | ICD-10-CM | POA: Diagnosis not present

## 2022-07-21 DIAGNOSIS — M79671 Pain in right foot: Secondary | ICD-10-CM | POA: Diagnosis not present

## 2022-07-21 DIAGNOSIS — M25561 Pain in right knee: Secondary | ICD-10-CM | POA: Diagnosis not present

## 2022-07-24 ENCOUNTER — Ambulatory Visit (INDEPENDENT_AMBULATORY_CARE_PROVIDER_SITE_OTHER): Payer: 59

## 2022-07-24 DIAGNOSIS — M25361 Other instability, right knee: Secondary | ICD-10-CM | POA: Diagnosis not present

## 2022-07-24 DIAGNOSIS — M25561 Pain in right knee: Secondary | ICD-10-CM

## 2022-07-24 DIAGNOSIS — G8929 Other chronic pain: Secondary | ICD-10-CM

## 2022-07-24 DIAGNOSIS — M25461 Effusion, right knee: Secondary | ICD-10-CM | POA: Diagnosis not present

## 2022-07-25 ENCOUNTER — Other Ambulatory Visit: Payer: Self-pay

## 2022-07-25 DIAGNOSIS — M722 Plantar fascial fibromatosis: Secondary | ICD-10-CM

## 2022-07-29 ENCOUNTER — Ambulatory Visit (INDEPENDENT_AMBULATORY_CARE_PROVIDER_SITE_OTHER): Payer: 59 | Admitting: Sports Medicine

## 2022-07-29 ENCOUNTER — Ambulatory Visit (INDEPENDENT_AMBULATORY_CARE_PROVIDER_SITE_OTHER): Payer: 59

## 2022-07-29 DIAGNOSIS — M25361 Other instability, right knee: Secondary | ICD-10-CM | POA: Diagnosis not present

## 2022-07-29 NOTE — Progress Notes (Signed)
    Procedures performed today:    Procedure: Real-time Ultrasound Guided injection of the right knee Device: Samsung HS60  Verbal informed consent obtained.  Time-out conducted.  Noted no overlying erythema, induration, or other signs of local infection.  Skin prepped in a sterile fashion.  Local anesthesia: Topical Ethyl chloride.  With sterile technique and under real time ultrasound guidance: Mild effusion noted, 1 cc Kenalog 40, 2 cc lidocaine, 2 cc bupivacaine injected easily Completed without difficulty  Advised to call if fevers/chills, erythema, induration, drainage, or persistent bleeding.  Images permanently stored and available for review in PACS.  Impression: Technically successful ultrasound guided injection.  Independent interpretation of notes and tests performed by another provider:   None.  Brief History, Exam, Impression, and Recommendations:    Knee instability, right MRI showed only arthritis, all internal structures were intact otherwise, today we did a knee joint injection with ultrasound guidance, return to see me in 6 weeks.    ____________________________________________ Gwen Her. Dianah Field, M.D., ABFM., CAQSM., AME. Primary Care and Sports Medicine [place] [facility]  Adjunct Professor of [place] of [facility] of Medicine  Risk manager

## 2022-07-29 NOTE — Assessment & Plan Note (Signed)
MRI showed only arthritis, all internal structures were intact otherwise, today we did a knee joint injection with ultrasound guidance, return to see me in 6 weeks.

## 2022-08-16 ENCOUNTER — Other Ambulatory Visit: Payer: Self-pay | Admitting: Physician Assistant

## 2022-08-16 ENCOUNTER — Encounter: Payer: Self-pay | Admitting: Physician Assistant

## 2022-08-16 ENCOUNTER — Ambulatory Visit: Payer: 59 | Admitting: Physician Assistant

## 2022-08-16 VITALS — BP 159/91 | HR 78 | Ht 64.0 in | Wt 218.0 lb

## 2022-08-16 DIAGNOSIS — D509 Iron deficiency anemia, unspecified: Secondary | ICD-10-CM | POA: Insufficient documentation

## 2022-08-16 DIAGNOSIS — M329 Systemic lupus erythematosus, unspecified: Secondary | ICD-10-CM | POA: Diagnosis not present

## 2022-08-16 DIAGNOSIS — I10 Essential (primary) hypertension: Secondary | ICD-10-CM | POA: Insufficient documentation

## 2022-08-16 DIAGNOSIS — R7303 Prediabetes: Secondary | ICD-10-CM

## 2022-08-16 DIAGNOSIS — Z23 Encounter for immunization: Secondary | ICD-10-CM

## 2022-08-16 DIAGNOSIS — Z9884 Bariatric surgery status: Secondary | ICD-10-CM | POA: Diagnosis not present

## 2022-08-16 DIAGNOSIS — E7800 Pure hypercholesterolemia, unspecified: Secondary | ICD-10-CM | POA: Diagnosis not present

## 2022-08-16 DIAGNOSIS — E6609 Other obesity due to excess calories: Secondary | ICD-10-CM

## 2022-08-16 DIAGNOSIS — F411 Generalized anxiety disorder: Secondary | ICD-10-CM | POA: Diagnosis not present

## 2022-08-16 DIAGNOSIS — Z6837 Body mass index (BMI) 37.0-37.9, adult: Secondary | ICD-10-CM | POA: Insufficient documentation

## 2022-08-16 DIAGNOSIS — D352 Benign neoplasm of pituitary gland: Secondary | ICD-10-CM | POA: Insufficient documentation

## 2022-08-16 MED ORDER — HYDROXYCHLOROQUINE SULFATE 200 MG PO TABS: 200.0000 mg | ORAL_TABLET | Freq: Two times a day (BID) | ORAL | 1 refills | Status: DC

## 2022-08-16 MED ORDER — BUPROPION HCL ER (SR) 100 MG PO TB12: 100.0000 mg | ORAL_TABLET | Freq: Two times a day (BID) | ORAL | 2 refills | Status: DC

## 2022-08-16 MED ORDER — MOUNJARO 2.5 MG/0.5ML ~~LOC~~ SOAJ: 2.5000 mg | SUBCUTANEOUS | 0 refills | Status: DC

## 2022-08-16 MED ORDER — HYDROCHLOROTHIAZIDE 25 MG PO TABS: 25.0000 mg | ORAL_TABLET | Freq: Every day | ORAL | 3 refills | Status: DC

## 2022-08-16 NOTE — Patient Instructions (Signed)
Get labs Start mounjaro once weekly and wellbutrin twice a day Recheck BP in office

## 2022-08-16 NOTE — Progress Notes (Unsigned)
New Patient Office Visit  Subjective    Patient ID: Michelle Huffman, female    DOB: 1966-02-09  Age: 56 y.o. MRN: [id number]  CC:  Chief Complaint  Patient presents with   Establish Care    HPI Michelle Huffman presents to establish care. Her PCP left the clinic and needed to re-established.   She needs medications refilled and would like to discuss help with weight loss. She had gastric sleeve surgery years ago and has slowly gained weight back. She does not exercise as much as she should.   Outpatient Encounter Medications as of 08/16/2022  Medication Sig   albuterol (PROVENTIL HFA;VENTOLIN HFA) 108 (90 Base) MCG/ACT inhaler Inhale 2 puffs into the lungs every 6 (six) hours as needed for shortness of breath.   buPROPion ER (WELLBUTRIN SR) 100 MG 12 hr tablet Take 1 tablet (100 mg total) by mouth 2 (two) times daily.   ondansetron (ZOFRAN) 4 MG tablet Take 1-2 tablets (4-8 mg total) by mouth every 8 (eight) hours as needed for nausea or vomiting.   pravastatin (PRAVACHOL) 20 MG tablet Take by mouth.   tirzepatide (MOUNJARO) 2.5 MG/0.5ML Pen Inject 2.5 mg into the skin once a week.   [DISCONTINUED] furosemide (LASIX) 20 MG tablet Take 20 mg by mouth daily.   [DISCONTINUED] hydrochlorothiazide (HYDRODIURIL) 25 MG tablet Take 1 tablet (25 mg total) by mouth daily.   [DISCONTINUED] hydroxychloroquine (PLAQUENIL) 200 MG tablet Take 200 mg by mouth 2 (two) times daily.    [DISCONTINUED] hydrOXYzine (VISTARIL) 25 MG capsule Take 25-'50mg'$  po qHS prn insomnia (Patient taking differently: Take 25-50 mg by mouth at bedtime as needed (insomnia).)   [DISCONTINUED] meloxicam (MOBIC) 15 MG tablet One tab PO every 24 hours with a meal for 2 weeks, then once every 24 hours prn pain.   [DISCONTINUED] metoprolol succinate (TOPROL-XL) 25 MG 24 hr tablet Take 1 tablet (25 mg total) by mouth daily.   [DISCONTINUED] mometasone-formoterol (DULERA) 100-5 MCG/ACT AERO Inhale 2 puffs into the lungs 2 (two)  times daily.   hydrochlorothiazide (HYDRODIURIL) 25 MG tablet Take 1 tablet (25 mg total) by mouth daily.   hydroxychloroquine (PLAQUENIL) 200 MG tablet Take 1 tablet (200 mg total) by mouth 2 (two) times daily.   No facility-administered encounter medications on file as of 08/16/2022.    Past Medical History:  Diagnosis Date   Alpha thalassemia major ([place])    Anemia    Arthritis    Asthma    Autoimmune disease ([place])    Blood transfusion    Chest pain    Complication of anesthesia    Hypertension    Hypothyroidism    Lupus ([place])    PONV (postoperative nausea and vomiting)     Past Surgical History:  Procedure Laterality Date   ABDOMINAL HYSTERECTOMY  2009   CHOLECYSTECTOMY  07/21/11   KNEE SURGERY  2010   laproscopic right knee   LAPAROSCOPIC GASTRIC SLEEVE RESECTION N/A 08/19/2014   Procedure: LAPAROSCOPIC GASTRIC SLEEVE RESECTION;  Surgeon: Excell Seltzer, MD;  Location[facility];  Service: General;  Laterality: N/A;   UPPER GI ENDOSCOPY  08/19/2014   Procedure: UPPER GI ENDOSCOPY;  Surgeon: Excell Seltzer, MD;  Location: [facility];  Service: General;;    Family History  Problem Relation Age of Onset   Depression Mother    Hypertension Father    Hyperlipidemia Father    Asthma Other    Hypertension Other    Hyperlipidemia Other    Alcohol abuse  Neg Hx    Anxiety disorder Neg Hx    Bipolar disorder Neg Hx    Dementia Neg Hx    Drug abuse Neg Hx    Schizophrenia Neg Hx     Social History   Socioeconomic History   Marital status: Single    Spouse name: Not on file   Number of children: 1   Years of education: 54   Highest education level: Not on file  Occupational History   Not on file  Tobacco Use   Smoking status: Former    Types: Cigarettes    Quit date: 11/17/2007    Years since quitting: 14.7   Smokeless tobacco: Never  Vaping Use   Vaping Use: Never used  Substance and Sexual Activity   Alcohol use: Yes    Comment: socially- a few times a  year   Drug use: No   Sexual activity: Not on file  Other Topics Concern   Not on file  Social History Narrative   Pt lives in [place] with her 16yo daughter. Born and raised in [place] by her parents. Pt has 2 bro and pt is the youngest. States it was a good childhood. Pt has completed one year of college. Pt works at [employer] in [employer] and [employer] for last 3 yrs. Pt has never been married.    Social Determinants of Health   Financial Resource Strain: Not on file  Food Insecurity: Not on file  Transportation Needs: Not on file  Physical Activity: Not on file  Stress: Not on file  Social Connections: Not on file  Intimate Partner Violence: Not on file    Review of Systems  All other systems reviewed and are negative.       Objective    BP (!) 159/91   Pulse 78   Ht '5\' 4"'$  (1.626 m)   Wt 218 lb (98.9 kg)   SpO2 100%   BMI 37.42 kg/m   Physical Exam Constitutional:      Appearance: Normal appearance. She is obese.  Cardiovascular:     Rate and Rhythm: Normal rate and regular rhythm.  Pulmonary:     Effort: Pulmonary effort is normal.  Neurological:     General: No focal deficit present.     Mental Status: She is alert and oriented to person, place, and time.  Psychiatric:        Mood and Affect: Mood normal.       Assessment & Plan:  Michelle KitchenMarland KitchenDenita was seen today for establish care.  Diagnoses and all orders for this visit:  Class 2 obesity due to excess calories without serious comorbidity with body mass index (BMI) of 37.0 to 37.9 in adult -     buPROPion ER (WELLBUTRIN SR) 100 MG 12 hr tablet; Take 1 tablet (100 mg total) by mouth 2 (two) times daily. -     TSH -     tirzepatide (MOUNJARO) 2.5 MG/0.5ML Pen; Inject 2.5 mg into the skin once a week.  Systemic lupus erythematosus, unspecified SLE type, unspecified organ involvement status (HCC) -     hydroxychloroquine (PLAQUENIL) 200 MG tablet; Take 1 tablet (200 mg total) by mouth 2 (two) times  daily.  GAD (generalized anxiety disorder)  S/P laparoscopic sleeve gastrectomy  Primary hypertension -     hydrochlorothiazide (HYDRODIURIL) 25 MG tablet; Take 1 tablet (25 mg total) by mouth daily.  Elevated LDL cholesterol level -     Lipid Panel w/reflex Direct LDL  Pre-diabetes -     Lipid Panel w/reflex Direct LDL -     COMPLETE METABOLIC PANEL WITH GFR -     Hemoglobin A1c -     tirzepatide (MOUNJARO) 2.5 MG/0.5ML Pen; Inject 2.5 mg into the skin once a week.  Need for shingles vaccine -     Varicella-zoster vaccine IM   Fasting labs ordered today.  BP is not to goal but pt did not take medication this morning Refilled HCTZ and will start checking at home and report readings.   Michelle Huffman.Discussed low carb diet with 1500 calories and 80g of protein.  Exercising at least 150 minutes a week.  My Fitness Pal could be a Microbiologist.  Start mounjaro for weight and pre-diabetes Discussed SE and how to taper up Start wellbutrin for craving control Follow up in 1 month for BP and medication recheck  Shingles vaccine #1 given today.   Return in about 4 weeks (around 09/13/2022) for Follow up.   Iran Planas, PA-C

## 2022-08-16 NOTE — Telephone Encounter (Signed)
Needs PA 

## 2022-08-17 ENCOUNTER — Encounter: Payer: Self-pay | Admitting: Physician Assistant

## 2022-08-17 DIAGNOSIS — E7800 Pure hypercholesterolemia, unspecified: Secondary | ICD-10-CM | POA: Insufficient documentation

## 2022-08-17 DIAGNOSIS — R7303 Prediabetes: Secondary | ICD-10-CM | POA: Insufficient documentation

## 2022-08-17 LAB — LIPID PANEL W/REFLEX DIRECT LDL
Cholesterol: 236 mg/dL — ABNORMAL HIGH (ref <200)
HDL: 44 mg/dL — ABNORMAL LOW (ref >=50)
LDL Cholesterol (Calc): 158 mg/dL (calc) — ABNORMAL HIGH
Non-HDL Cholesterol (Calc): 192 mg/dL (calc) — ABNORMAL HIGH (ref <130)
Total CHOL/HDL Ratio: 5.4 (calc) — ABNORMAL HIGH (ref <5.0)
Triglycerides: 181 mg/dL — ABNORMAL HIGH (ref <150)

## 2022-08-17 LAB — COMPLETE METABOLIC PANEL WITH GFR
AG Ratio: 1.1 (calc) (ref 1.0–2.5)
ALT: 23 U/L (ref 6–29)
AST: 19 U/L (ref 10–35)
Albumin: 4.1 g/dL (ref 3.6–5.1)
Alkaline phosphatase (APISO): 79 U/L (ref 37–153)
BUN: 14 mg/dL (ref 7–25)
CO2: 26 mmol/L (ref 20–32)
Calcium: 9.4 mg/dL (ref 8.6–10.4)
Chloride: 107 mmol/L (ref 98–110)
Creat: 0.74 mg/dL (ref 0.50–1.03)
Globulin: 3.9 g/dL (calc) — ABNORMAL HIGH (ref 1.9–3.7)
Glucose, Bld: 88 mg/dL (ref 65–99)
Potassium: 4.1 mmol/L (ref 3.5–5.3)
Sodium: 142 mmol/L (ref 135–146)
Total Bilirubin: 0.3 mg/dL (ref 0.2–1.2)
Total Protein: 8 g/dL (ref 6.1–8.1)
eGFR: 95 mL/min/{1.73_m2} (ref >=60)

## 2022-08-17 LAB — TSH: TSH: 2.28 mIU/L

## 2022-08-17 LAB — HEMOGLOBIN A1C
Hgb A1c MFr Bld: 6.3 % of total Hgb — ABNORMAL HIGH (ref <5.7)
Mean Plasma Glucose: 134 mg/dL
eAG (mmol/L): 7.4 mmol/L

## 2022-08-17 NOTE — Progress Notes (Signed)
Kimbrely,   Cholesterol is elevated and your overall CV risk is elevated. You are on pravachol but I would like to switch you to a better statin. Are you ok with switching to crestor?   Marland KitchenMarland KitchenThe 10-year ASCVD risk score (Arnett DK, et al., 2019) is: 14.6%   Values used to calculate the score:     Age: 56 years     Sex: Female     Is Non-Hispanic African American: Yes     Diabetic: No     Tobacco smoker: No     Systolic Blood Pressure: 623 mmHg     Is BP treated: Yes     HDL Cholesterol: 44 mg/dL     Total Cholesterol: 236 mg/dL  A1C in pre-diabetes range and very close to diabetes range. Working on weight loss and low sugar/low carb diet is very important.   Thyroid looks great.

## 2022-08-18 ENCOUNTER — Telehealth: Payer: Self-pay

## 2022-08-18 ENCOUNTER — Ambulatory Visit: Payer: 59 | Admitting: Bariatrics

## 2022-08-18 ENCOUNTER — Encounter: Payer: Self-pay | Admitting: Physician Assistant

## 2022-08-18 NOTE — Telephone Encounter (Addendum)
Initiated Prior authorization ACG:[id number] 2.'5MG'$ /0.5ML pen-injectors Via: Covermymeds Case/Key:[id number] Status: approved  as of 08/18/22 Reason:valid through 08/19/23 Notified Pt via: Mychart

## 2022-08-19 MED ORDER — ROSUVASTATIN CALCIUM 10 MG PO TABS: 10.0000 mg | ORAL_TABLET | Freq: Every day | ORAL | 3 refills | Status: DC

## 2022-08-19 NOTE — Addendum Note (Signed)
Addended by: Donella Stade on: 08/19/2022 06:19 AM   Modules accepted: Orders

## 2022-08-22 ENCOUNTER — Other Ambulatory Visit (HOSPITAL_BASED_OUTPATIENT_CLINIC_OR_DEPARTMENT_OTHER): Payer: Self-pay

## 2022-08-22 ENCOUNTER — Other Ambulatory Visit: Payer: Self-pay | Admitting: Physician Assistant

## 2022-09-01 ENCOUNTER — Telehealth: Payer: Self-pay | Admitting: Sports Medicine

## 2022-09-01 ENCOUNTER — Ambulatory Visit (INDEPENDENT_AMBULATORY_CARE_PROVIDER_SITE_OTHER): Payer: 59 | Admitting: Sports Medicine

## 2022-09-01 DIAGNOSIS — M1711 Unilateral primary osteoarthritis, right knee: Secondary | ICD-10-CM

## 2022-09-01 DIAGNOSIS — M722 Plantar fascial fibromatosis: Secondary | ICD-10-CM

## 2022-09-01 NOTE — Progress Notes (Signed)
    Procedures performed today:    None.  Independent interpretation of notes and tests performed by another provider:   None.  Brief History, Exam, Impression, and Recommendations:    Plantar fascial fibromatosis of right foot Plantar fascial fibromatosis pain has now resolved, continue home conditioning for plantar fasciitis and supportive footwear day and night, no barefoot walking, return as needed.  Primary osteoarthritis of right knee This is a very pleasant 56 year old female, she has MRI confirmed osteoarthritis without other internal derangement, she had a knee joint injection about a month ago and returns today doing significantly better, she still has a bit of discomfort so we will go ahead and get her approved for viscosupplementation. For insurance approval purposes she has failed greater than 6 weeks of conservative treatment, she has had home physical therapy, x-rays, injections, NSAIDs.    ____________________________________________ Gwen Her. Dianah Field, M.D., ABFM., CAQSM., AME. Primary Care and Sports Medicine [place] [facility]  Adjunct Professor of [place] of [facility] of Medicine  Risk manager

## 2022-09-01 NOTE — Telephone Encounter (Signed)
[employer] approval please, right knee osteoarthritis.

## 2022-09-01 NOTE — Assessment & Plan Note (Signed)
Plantar fascial fibromatosis pain has now resolved, continue home conditioning for plantar fasciitis and supportive footwear day and night, no barefoot walking, return as needed.

## 2022-09-01 NOTE — Assessment & Plan Note (Signed)
This is a very pleasant 56 year old female, she has MRI confirmed osteoarthritis without other internal derangement, she had a knee joint injection about a month ago and returns today doing significantly better, she still has a bit of discomfort so we will go ahead and get her approved for viscosupplementation. For insurance approval purposes she has failed greater than 6 weeks of conservative treatment, she has had home physical therapy, x-rays, injections, NSAIDs.

## 2022-09-02 NOTE — Telephone Encounter (Signed)
[employer] paperwork faxed to [employer] at [phone] Request is for [facility] Pt's insurance prefers [employer] Fax confirmation receipt received  

## 2022-09-05 NOTE — Telephone Encounter (Signed)
Benefits Investigation Details received from [employer]: Orthovisc  Medical: Deductible applies. Since the deductible has been met, pt is responsible for coinsurance. Once the OOP has been met, pt is covered at 100%.  PA required: Yes PA initiated on [email or web address].   Pharmacy: Benefits undisclosed until PA has been obtained.   Specialty Pharmacy: [facility]  May fill through: [place] Copay/Coinsurance:  Product Copay: 20% ($24) Administration Coinsurance: 20% ($140)  Administration Copay:  Deductible: $1500 (met: $1500) Out of Pocket Max: $4000 (met: [id number])    Awaiting a responses on PA from [facility].

## 2022-09-06 NOTE — Telephone Encounter (Signed)
Fax rec'd from [facility] that Orthovisc is not a preferred product. PA cancelled and a new one was submitted for Euflexxa (which is a preferred product).  Records faxed to support the PA; confirmation rec'd.

## 2022-09-08 NOTE — Telephone Encounter (Signed)
Task completed and order has been placed.  Euflexxa  Order #[id number]

## 2022-09-08 NOTE — Telephone Encounter (Signed)
Sarah from [facility] called to let us know Euflexxa doesn't need authorization.  Euflexxa has to be ordered and we will contact patient when it comes in.

## 2022-09-09 ENCOUNTER — Ambulatory Visit: Payer: 59 | Admitting: Sports Medicine

## 2022-09-09 NOTE — Telephone Encounter (Addendum)
We have shots in clinic and will get patient scheduled.

## 2022-09-13 ENCOUNTER — Other Ambulatory Visit: Payer: Self-pay | Admitting: Physician Assistant

## 2022-09-13 ENCOUNTER — Ambulatory Visit (INDEPENDENT_AMBULATORY_CARE_PROVIDER_SITE_OTHER): Payer: 59 | Admitting: Physician Assistant

## 2022-09-13 ENCOUNTER — Ambulatory Visit (INDEPENDENT_AMBULATORY_CARE_PROVIDER_SITE_OTHER): Payer: 59

## 2022-09-13 VITALS — BP 156/87 | HR 67 | Ht 64.0 in | Wt 215.0 lb

## 2022-09-13 DIAGNOSIS — R7303 Prediabetes: Secondary | ICD-10-CM | POA: Diagnosis not present

## 2022-09-13 DIAGNOSIS — E6609 Other obesity due to excess calories: Secondary | ICD-10-CM

## 2022-09-13 DIAGNOSIS — M546 Pain in thoracic spine: Secondary | ICD-10-CM

## 2022-09-13 DIAGNOSIS — I10 Essential (primary) hypertension: Secondary | ICD-10-CM | POA: Diagnosis not present

## 2022-09-13 DIAGNOSIS — M549 Dorsalgia, unspecified: Secondary | ICD-10-CM

## 2022-09-13 DIAGNOSIS — M5134 Other intervertebral disc degeneration, thoracic region: Secondary | ICD-10-CM

## 2022-09-13 DIAGNOSIS — Z6837 Body mass index (BMI) 37.0-37.9, adult: Secondary | ICD-10-CM | POA: Diagnosis not present

## 2022-09-13 MED ORDER — CYCLOBENZAPRINE HCL 10 MG PO TABS: 10.0000 mg | ORAL_TABLET | Freq: Three times a day (TID) | ORAL | 0 refills | Status: DC | PRN

## 2022-09-13 MED ORDER — LISINOPRIL-HYDROCHLOROTHIAZIDE 10-12.5 MG PO TABS: 1.0000 | ORAL_TABLET | Freq: Every day | ORAL | 0 refills | Status: DC

## 2022-09-13 MED ORDER — TIRZEPATIDE 5 MG/0.5ML ~~LOC~~ SOAJ: 5.0000 mg | SUBCUTANEOUS | 0 refills | Status: DC

## 2022-09-13 MED ORDER — TIRZEPATIDE 7.5 MG/0.5ML ~~LOC~~ SOAJ: 7.5000 mg | SUBCUTANEOUS | 0 refills | Status: DC

## 2022-09-13 NOTE — Progress Notes (Signed)
Established Patient Office Visit  Subjective   Patient ID: Michelle Huffman, female    DOB: 1966-01-02  Age: 56 y.o. MRN: [id number]  Chief Complaint  Patient presents with   Follow-up   Hypertension    HPI Patient is a 56 year old obese female with prediabetes, hypertension, rheumatoid arthritis, MDD, hypothyroidism who presents to the clinic to follow-up on blood pressure.  At last visit her blood pressure was elevated.  She continues to check in and is elevated at home as well.  She denies any chest pain, palpitations, vision changes, headaches or dizziness.  She is taking her HCTZ daily.  She is doing well on Mounjaro 2.5 mg.  She would like to increase the dose if she could.  She has lost 6 pounds in the last month.  She denies any significant side effects or concerns.  She has started to eat less and being more active.  Patient is well-controlled on rheumatoid arthritis with Plaquenil.  Patient is having some intermittent mid back pain that radiates into the anterior chest.  She has had this before and had a cardiac workup which was negative.  This pain does happen more when she uses her arms, twist and is more active.  She denies any of these issues with exertion.  Pain does not last very long but is intense.  She denies any known injury.  .. Active Ambulatory Problems    Diagnosis Date Noted   PITUITARY ADENOMA, BENIGN 07/25/2007   HYPOTHYROIDISM 05/03/2007   ANEMIA-IRON DEFICIENCY 05/03/2007   PAROTIDITIS 10/11/2007   Biliary dyskinesia 07/11/2011   Right shoulder pain 03/06/2014   Morbid obesity ([place]) 03/27/2014   Insomnia 11/17/2015   GAD (generalized anxiety disorder) 11/17/2015   Severe episode of recurrent major depressive disorder, without psychotic features ([place]) 11/17/2015   Systemic lupus erythematosus ([place]) 07/20/2022   Plantar fascial fibromatosis of right foot 07/20/2022   Primary osteoarthritis of right knee 07/20/2022   Class 2 obesity due to excess  calories without serious comorbidity with body mass index (BMI) of 37.0 to 37.9 in adult 08/16/2022   S/P laparoscopic sleeve gastrectomy 08/16/2022   Primary hypertension 08/16/2022   Alpha thalassemia trait 03/31/2015   ANA positive 10/17/2013   Asthma 10/17/2013   Elevated sed rate 10/17/2013   Iron deficiency 01/14/2016   Mixed hyperlipidemia 03/31/2021   Prolactinoma ([place]) 08/16/2022   Recurrent bronchospasm 07/23/2013   Rheumatoid arthritis ([place]) 10/29/2013   Microcytic anemia 08/16/2022   Elevated LDL cholesterol level 08/17/2022   Pre-diabetes 08/17/2022   Mid back pain 09/13/2022   DDD (degenerative disc disease), thoracic 09/14/2022   Resolved Ambulatory Problems    Diagnosis Date Noted   No Resolved Ambulatory Problems   Past Medical History:  Diagnosis Date   Alpha thalassemia major ([place])    Anemia    Arthritis    Autoimmune disease ([place])    Blood transfusion    Chest pain    Complication of anesthesia    Hypertension    Hypothyroidism    Lupus ([place])    PONV (postoperative nausea and vomiting)     ROS   See HPI.  Objective:     BP (!) 156/87   Pulse 67   Ht '5\' 4"'$  (1.626 m)   Wt 215 lb (97.5 kg)   SpO2 100%   BMI 36.90 kg/m  BP Readings from Last 3 Encounters:  09/15/22 132/88  09/13/22 (!) 156/87  08/16/22 (!) 159/91   Wt Readings from Last 3 Encounters:  09/15/22 210 lb (95.3 kg)  09/13/22 215 lb (97.5 kg)  08/16/22 218 lb (98.9 kg)      Physical Exam Vitals reviewed.  Constitutional:      Appearance: Normal appearance. She is obese.  HENT:     Head: Normocephalic.  Cardiovascular:     Rate and Rhythm: Normal rate and regular rhythm.     Pulses: Normal pulses.     Heart sounds: Normal heart sounds.  Pulmonary:     Effort: Pulmonary effort is normal.     Breath sounds: Normal breath sounds.  Musculoskeletal:     Right lower leg: No edema.     Left lower leg: No edema.  Neurological:     General: No focal deficit present.      Mental Status: She is alert and oriented to person, place, and time.  Psychiatric:        Mood and Affect: Mood normal.       The 10-year ASCVD risk score (Arnett DK, et al., 2019) is: 7.9%    Assessment & Plan:  Marland KitchenMarland KitchenDenita was seen today for follow-up and hypertension.  Diagnoses and all orders for this visit:  Mid back pain -     cyclobenzaprine (FLEXERIL) 10 MG tablet; Take 1 tablet (10 mg total) by mouth 3 (three) times daily as needed for muscle spasms. -     DG Thoracic Spine W/Swimmers; Future  Class 2 obesity due to excess calories without serious comorbidity with body mass index (BMI) of 37.0 to 37.9 in adult -     tirzepatide (MOUNJARO) 7.5 MG/0.5ML Pen; Inject 7.5 mg into the skin once a week. (Patient not taking: Reported on 09/15/2022) -     tirzepatide [facility]) 5 MG/0.5ML Pen; Inject 5 mg into the skin once a week.  Pre-diabetes -     tirzepatide (MOUNJARO) 7.5 MG/0.5ML Pen; Inject 7.5 mg into the skin once a week. (Patient not taking: Reported on 09/15/2022) -     tirzepatide [facility]) 5 MG/0.5ML Pen; Inject 5 mg into the skin once a week.  Primary hypertension -     tirzepatide (MOUNJARO) 7.5 MG/0.5ML Pen; Inject 7.5 mg into the skin once a week. (Patient not taking: Reported on 09/15/2022) -     tirzepatide [facility]) 5 MG/0.5ML Pen; Inject 5 mg into the skin once a week. -     lisinopril-hydrochlorothiazide (ZESTORETIC) 10-12.5 MG tablet; Take 1 tablet by mouth daily.  DDD (degenerative disc disease), thoracic   BP elevated today Start zestoretic. Pt was on HcTZ which she can stop now.  BP recheck in 2 weeks nurse visit Discussed side effects  Pt is tolerating mounjaro well. She is down 6lbs. Increased to '5mg'$  and then 7.'5mg'$  dosing Follow up in 3 months  Discussed some mid back pain Xray ordered Does not sound cardiac Flexeril given to use as needed Suggested massage therapy Follow up if symptoms worsen or change   Iran Planas, PA-C

## 2022-09-14 ENCOUNTER — Encounter: Payer: Self-pay | Admitting: Physician Assistant

## 2022-09-14 ENCOUNTER — Encounter: Payer: 59 | Admitting: Bariatrics

## 2022-09-14 DIAGNOSIS — M5134 Other intervertebral disc degeneration, thoracic region: Secondary | ICD-10-CM | POA: Insufficient documentation

## 2022-09-14 NOTE — Progress Notes (Signed)
You do have some degenerative disc disease of thoracic spine. This can cause the muscles to react and spasms like they are.

## 2022-09-14 NOTE — Telephone Encounter (Signed)
PA

## 2022-09-15 ENCOUNTER — Ambulatory Visit (INDEPENDENT_AMBULATORY_CARE_PROVIDER_SITE_OTHER): Payer: 59 | Admitting: Nurse Practitioner

## 2022-09-15 ENCOUNTER — Encounter: Payer: Self-pay | Admitting: Nurse Practitioner

## 2022-09-15 VITALS — BP 132/88 | HR 69 | Temp 98.5°F | Ht 64.0 in | Wt 210.0 lb

## 2022-09-15 DIAGNOSIS — E785 Hyperlipidemia, unspecified: Secondary | ICD-10-CM | POA: Diagnosis not present

## 2022-09-15 DIAGNOSIS — Z9884 Bariatric surgery status: Secondary | ICD-10-CM

## 2022-09-15 DIAGNOSIS — Z6836 Body mass index (BMI) 36.0-36.9, adult: Secondary | ICD-10-CM

## 2022-09-15 DIAGNOSIS — E669 Obesity, unspecified: Secondary | ICD-10-CM

## 2022-09-15 DIAGNOSIS — E6609 Other obesity due to excess calories: Secondary | ICD-10-CM

## 2022-09-16 ENCOUNTER — Telehealth: Payer: Self-pay | Admitting: Physician Assistant

## 2022-09-16 DIAGNOSIS — M5134 Other intervertebral disc degeneration, thoracic region: Secondary | ICD-10-CM

## 2022-09-16 DIAGNOSIS — M549 Dorsalgia, unspecified: Secondary | ICD-10-CM

## 2022-09-16 NOTE — Telephone Encounter (Signed)
Michelle Huffman received the results of Degenerative Disc Disease and would like to know if she can proceed with Physical Therapy to help with the pain and treatment? Please advise

## 2022-09-19 ENCOUNTER — Other Ambulatory Visit: Payer: Self-pay | Admitting: Physician Assistant

## 2022-09-19 ENCOUNTER — Encounter: Payer: Self-pay | Admitting: Physician Assistant

## 2022-09-19 MED ORDER — MELOXICAM 15 MG PO TABS: 15.0000 mg | ORAL_TABLET | Freq: Every day | ORAL | 2 refills | Status: DC

## 2022-09-19 NOTE — Addendum Note (Signed)
Addended byLoma Boston, Luvenia Starch L on: 09/19/2022 10:00 AM   Modules accepted: Orders

## 2022-09-19 NOTE — Telephone Encounter (Signed)
Patient made aware. She would like to try Mobic. Pended 15 mg, please sign if that is the appropriate dosage.  Verified pharmacy with patient.

## 2022-09-22 ENCOUNTER — Telehealth: Payer: Self-pay

## 2022-09-22 NOTE — Telephone Encounter (Addendum)
Initiated Prior authorization ZJQ:[id number] '7MG'$ /0.5ML pen-injectors Via: Covermymeds Case/Key:[id number] Status: approved  as of 09/22/22 Reason:Member has an active PA on file which is expiring on 08/19/2023.  Notified Pt via: Mychart, notfied pt verbally   Initiated Prior authorization MMC:RFVOHKG '18MG'$ /3ML pen-injectors Via: Covermymeds Case/Key:[id number] Status: approved    as of 12/21  Reason:Member should be able to get the drug/product without a PA at this time. Notified Pt via: Mychart, notified pt verbally   Pt has been having some difficulty getting [facility] is currently on back order, pt was advised to call other pharmacies to see what store has stock available   Called med impact to clarify: According to med impact a processed claim for 2.5 was paid out on 09/19/22  Pt has refill too soon pt not due to refill till 10/14/22 However pt's insurance will change leaving the valid on file to expire 10/02/22.

## 2022-09-23 ENCOUNTER — Other Ambulatory Visit (HOSPITAL_BASED_OUTPATIENT_CLINIC_OR_DEPARTMENT_OTHER): Payer: Self-pay

## 2022-09-23 ENCOUNTER — Other Ambulatory Visit: Payer: Self-pay

## 2022-09-23 DIAGNOSIS — E66812 Obesity, class 2: Secondary | ICD-10-CM

## 2022-09-23 DIAGNOSIS — E6609 Other obesity due to excess calories: Secondary | ICD-10-CM

## 2022-09-23 DIAGNOSIS — R7303 Prediabetes: Secondary | ICD-10-CM

## 2022-09-23 DIAGNOSIS — I10 Essential (primary) hypertension: Secondary | ICD-10-CM

## 2022-09-23 MED ORDER — TIRZEPATIDE 5 MG/0.5ML ~~LOC~~ SOAJ
5.0000 mg | SUBCUTANEOUS | 0 refills | Status: DC
  Filled 2022-09-23: qty 2, 28d supply, fill #0

## 2022-09-23 MED ORDER — TIRZEPATIDE 7.5 MG/0.5ML ~~LOC~~ SOAJ
7.5000 mg | SUBCUTANEOUS | 0 refills | Status: DC
  Filled 2022-09-23 – 2022-10-24 (×2): qty 2, 28d supply, fill #0

## 2022-09-27 ENCOUNTER — Other Ambulatory Visit: Payer: Self-pay

## 2022-09-27 ENCOUNTER — Other Ambulatory Visit (HOSPITAL_COMMUNITY): Payer: Self-pay

## 2022-09-27 DIAGNOSIS — M329 Systemic lupus erythematosus, unspecified: Secondary | ICD-10-CM

## 2022-09-27 DIAGNOSIS — E6609 Other obesity due to excess calories: Secondary | ICD-10-CM

## 2022-09-27 DIAGNOSIS — I10 Essential (primary) hypertension: Secondary | ICD-10-CM

## 2022-09-27 MED ORDER — ROSUVASTATIN CALCIUM 10 MG PO TABS
10.0000 mg | ORAL_TABLET | Freq: Every day | ORAL | 3 refills | Status: DC
  Filled 2022-09-27 – 2022-12-14 (×3): qty 90, 90d supply, fill #0
  Filled 2023-04-17: qty 90, 90d supply, fill #1

## 2022-09-27 MED ORDER — HYDROXYCHLOROQUINE SULFATE 200 MG PO TABS
200.0000 mg | ORAL_TABLET | Freq: Two times a day (BID) | ORAL | 1 refills | Status: DC
  Filled 2022-09-27 – 2022-10-24 (×2): qty 180, 90d supply, fill #0
  Filled 2023-02-20: qty 180, 90d supply, fill #1

## 2022-09-27 MED ORDER — BUPROPION HCL ER (SR) 100 MG PO TB12
100.0000 mg | ORAL_TABLET | Freq: Two times a day (BID) | ORAL | 2 refills | Status: DC
  Filled 2022-09-27 – 2022-12-14 (×3): qty 60, 30d supply, fill #0

## 2022-09-27 MED ORDER — LISINOPRIL-HYDROCHLOROTHIAZIDE 10-12.5 MG PO TABS
1.0000 | ORAL_TABLET | Freq: Every day | ORAL | 0 refills | Status: DC
  Filled 2022-09-27 – 2022-12-14 (×3): qty 30, 30d supply, fill #0

## 2022-09-27 NOTE — Progress Notes (Unsigned)
Office: [phone]  /  Fax: [phone]   Initial Visit  Michelle Huffman was seen in clinic today to evaluate for obesity. She is interested in losing weight to improve overall health and reduce the risk of weight related complications. She presents today to review program treatment options, initial physical assessment, and evaluation.     She was referred by: Self-Referral  When asked what else they would like to accomplish? She states: Improve existing medical conditions and Lose a target amount of weight : 160 lbs  When asked how has your weight affected you? She states: Has affected self-esteem, Contributed to medical problems, and Contributed to orthopedic problems or mobility issues  Some associated conditions: Hypertension, Hyperlipidemia, and Prediabetes  Contributing factors: Other: during COVID  Weight promoting medications identified: Beta-blockers and Steroids  Current nutrition plan: Portion control / smart choices  Current level of physical activity: Walking  Current or previous pharmacotherapy: Metformin and Phentermine  Response to medication: Ineffective so it was discontinued   Past medical history includes:   Past Medical History:  Diagnosis Date   Alpha thalassemia major ([place])    Anemia    Arthritis    Asthma    Autoimmune disease ([place])    Blood transfusion    Chest pain    Complication of anesthesia    Hypertension    Hypothyroidism    Lupus ([facility])    PONV (postoperative nausea and vomiting)      Objective:   BP 132/88   Pulse 69   Temp 98.5 F (36.9 C) (Oral)   Ht '5\' 4"'$  (1.626 m)   Wt 210 lb (95.3 kg)   SpO2 100%   BMI 36.05 kg/m  She was weighed on the bioimpedance scale: Body mass index is 36.05 kg/m.  Peak Weight:210 lbs. General:  Alert, oriented and cooperative. Patient is in no acute distress.  Respiratory: Normal respiratory effort, no problems with respiration noted  Extremities: Normal range of motion.    Mental  Status: Normal mood and affect. Normal behavior. Normal judgment and thought content.   Assessment and Plan:  1. S/P laparoscopic sleeve gastrectomy Unsure of the year had surgert. Highest weight prior to surgery 370 lbs. Not currently on MV, Vit D, iron or B12.   2. Hyperlipidemia, unspecified hyperlipidemia type Taking Crestor 10 mg. Denies any side effects.  3. Class 2 obesity with current BMI 36.3 We reviewed weight, biometrics, associated medical conditions and contributing factors with patient. She would benefit from weight loss therapy via a modified calorie, low-carb, high-protein nutritional plan tailored to their REE (resting energy expenditure) which will be determined by indirect calorimetry.  We will also assess for cardiometabolic risk and nutritional derangements via fasting serologies at her next appointment.      Obesity Treatment / Action Plan:  Counseled on the health benefits of losing 5%-15% of total body weight. Was counseled on nutritional approaches to weight loss and benefits of complex carbs and high quality protein as part of nutritional weight management. Will work on increasing water intake with a goal of 125 ounces for men and 91 ounces for women.   Obesity Education Performed Today:  She was weighed on the bioimpedance scale and results were discussed and documented in the synopsis.  We discussed obesity as a disease and the importance of a more detailed evaluation of all the factors contributing to the disease.  We discussed the importance of long term lifestyle changes which include nutrition, exercise and behavioral modifications as well  as the importance of customizing this to her specific health and social needs.  We discussed the benefits of reaching a healthier weight to alleviate the symptoms of existing conditions and reduce the risks of the biomechanical, metabolic and psychological effects of obesity.  Michelle Huffman appears to be in the  action stage of change and states they are ready to start intensive lifestyle modifications and behavioral modifications.  30 minutes was spent today on this visit including the above counseling, pre-visit chart review, and post-visit documentation.  Reviewed by clinician on day of visit: allergies, medications, problem list, medical history, surgical history, family history, social history, and previous encounter notes.  I spent 30 minutes with the patient and reviewing her chart before and after her visit.   I, Brendell Tyus, RMA, am acting as transcriptionist for Everardo Pacific, FNP.   I have reviewed the above documentation for accuracy and completeness, and I agree with the above. Everardo Pacific, FNP

## 2022-10-04 NOTE — Therapy (Signed)
OUTPATIENT PHYSICAL THERAPY THORACOLUMBAR EVALUATION   Patient Name: Michelle Huffman MRN: [id number] DOB:January 10, 1966, 57 y.o., female Today's Date: 10/05/2022  END OF SESSION:  PT End of Session - 10/05/22 0718     Visit Number 1    Number of Visits 16    Date for PT Re-Evaluation 11/30/22    PT Start Time 0710    PT Stop Time 0800    PT Time Calculation (min) 50 min    Activity Tolerance Patient tolerated treatment well             Past Medical History:  Diagnosis Date   Alpha thalassemia major ([facility])    Anemia    Arthritis    Asthma    Autoimmune disease ([facility])    Blood transfusion    Chest pain    Complication of anesthesia    Hypertension    Hypothyroidism    Lupus ([facility])    PONV (postoperative nausea and vomiting)    Past Surgical History:  Procedure Laterality Date   ABDOMINAL HYSTERECTOMY  2009   CHOLECYSTECTOMY  07/21/11   KNEE SURGERY  2010   laproscopic right knee   LAPAROSCOPIC GASTRIC SLEEVE RESECTION N/A 08/19/2014   Procedure: LAPAROSCOPIC GASTRIC SLEEVE RESECTION;  Surgeon: Glenna Fellows, MD;  Location: [facility];  Service: General;  Laterality: N/A;   UPPER GI ENDOSCOPY  08/19/2014   Procedure: UPPER GI ENDOSCOPY;  Surgeon: Glenna Fellows, MD;  Location: [facility];  Service: General;;   Patient Active Problem List   Diagnosis Date Noted   DDD (degenerative disc disease), thoracic 09/14/2022   Mid back pain 09/13/2022   Elevated LDL cholesterol level 08/17/2022   Pre-diabetes 08/17/2022   Class 2 obesity due to excess calories without serious comorbidity with body mass index (BMI) of 37.0 to 37.9 in adult 08/16/2022   S/P laparoscopic sleeve gastrectomy 08/16/2022   Primary hypertension 08/16/2022   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   Microcytic anemia 08/16/2022   Systemic lupus erythematosus ([facility]) 07/20/2022   Plantar fascial fibromatosis of right foot 07/20/2022   Primary osteoarthritis of right knee 07/20/2022   Mixed hyperlipidemia  03/31/2021   Iron deficiency 01/14/2016   Insomnia 11/17/2015   GAD (generalized anxiety disorder) 11/17/2015   Severe episode of recurrent major depressive disorder, without psychotic features ([facility]) 11/17/2015   Alpha thalassemia trait 03/31/2015   Morbid obesity ([facility]) 03/27/2014   Right shoulder pain 03/06/2014   Rheumatoid arthritis ([facility]) 10/29/2013   ANA positive 10/17/2013   Asthma 10/17/2013   Elevated sed rate 10/17/2013   Recurrent bronchospasm 07/23/2013   Biliary dyskinesia 07/11/2011   PAROTIDITIS 10/11/2007   PITUITARY ADENOMA, BENIGN 07/25/2007   HYPOTHYROIDISM 05/03/2007   ANEMIA-IRON DEFICIENCY 05/03/2007    PCP: Jomarie Longs, PA-C  REFERRING PROVIDER: Jomarie Longs, PA-C  REFERRING DIAG: DDD thoracic spine   Rationale for Evaluation and Treatment: Rehabilitation  THERAPY DIAG:  DDD (degenerative disc disease), thoracic  Mid back pain  Abnormal posture  Muscle weakness (generalized)  ONSET DATE: 03/03/22  SUBJECTIVE:  SUBJECTIVE STATEMENT: Patient reports that she was having back pain that she would sometimes feel in her chest over the past 6 months or more. Xrays show degenerative changes in the thoracic spine. muscle relaxer has helped with discomfort.   PERTINENT HISTORY:  Denies any medical problems other than HTN  PAIN:  Are you having pain? Yes: NPRS scale: 2/10 Pain location: middle back  Pain description: nagging  Aggravating factors: pulling shoulders back; use of UE's  Relieving factors: muscle relaxant; being still   PRECAUTIONS: None  WEIGHT BEARING RESTRICTIONS: No  FALLS:  Has patient fallen in last 6 months? No  LIVING ENVIRONMENT: Lives with: lives with their family Lives in: House/apartment Stairs: No   OCCUPATION: office at medical  provider   PLOF: Independent  PATIENT GOALS: get rid of pain   NEXT MD VISIT:   OBJECTIVE:   DIAGNOSTIC FINDINGS:  Xrays 09/13/22 - Minimal degenerative joint changes of thoracic spine   PATIENT SURVEYS:  FOTO 50   COGNITION: Overall cognitive status: Within functional limits for tasks assessed     SENSATION: WFL   POSTURE: Patient presents with head forward posture with increased thoracic kyphosis; shoulders rounded and elevated; scapulae abducted and rotated along the thoracic spine; head of the humerus anterior in orientation.   PALPATION: Muscular tightness Rt > Lt thoracic paraspinals   LUMBAR ROM:   AROM eval  Flexion 70% pull with return to stand   Extension 20%  Right lateral flexion 85% discomfort   Left lateral flexion 85% discomfort   Right rotation 60% discomfort   Left rotation 60% mild discomfort    (Blank rows = not tested)  LOWER EXTREMITY ROM:     Tight hamstrings and piriformis Rt > Lt   LOWER EXTREMITY MMT:    UE/LE strength WFL's   LUMBAR SPECIAL TESTS:  Straight leg raise test: Negative and Slump test: muscular tightness and pain in the thoracic spine in area of tightness    TODAY'S TREATMENT:                                                                                                                              [facility] Adult PT Treatment:                                                DATE: 10/05/22 Therapeutic Exercise: Chin tuck 10 sec x 5 sitting with noodle along spine  Scap squeeze 10 sec x 5 sitting with noodle along spine  Doorway stretch 3 positions 30 sec x 1  Manual Therapy: To add as needed  Neuromuscular re-ed: Postural correction/education - use of noodle for sitting  Therapeutic Activity: Modification of sitting postures and ergonomics  Modalities: TENS bilat thoracic paraspinals x 15 min (pt sitting) Moist heat thoracic spine x 15 min (pt sitting) Self  Care: Ergonomics for sitting     PATIENT EDUCATION:   Education details: POC; HEP  Person educated: Patient Education method: Programmer, multimedia, Facilities manager, Actor cues, Verbal cues, and Handouts Education comprehension: verbalized understanding, returned demonstration, verbal cues required, tactile cues required, and needs further education  HOME EXERCISE PROGRAM: Access Code: [email or web address] URL: [email or web address] Date: 10/05/2022 Prepared by: Corlis Leak  Exercises - Prone Press Up  - 2 x daily - 7 x weekly - 1 sets - 10 reps - 2-3 sec  hold - Prone Press Up On Elbows  - 2 x daily - 7 x weekly - 1 sets - 3 reps - 30 sec  hold - Prone Press Up On Elbows  - 2 x daily - 7 x weekly - 1 sets - 3 reps - 30 sec  hold - Standing Back Extension  - 2 x daily - 7 x weekly - 1 sets - 3 reps - 30 sec  hold - Doorway Pec Stretch at 60 Degrees Abduction  - 3 x daily - 7 x weekly - 1 sets - 3 reps - Doorway Pec Stretch at 90 Degrees Abduction  - 3 x daily - 7 x weekly - 1 sets - 3 reps - 30 seconds  hold - Doorway Pec Stretch at 120 Degrees Abduction  - 3 x daily - 7 x weekly - 1 sets - 3 reps - 30 second hold  hold - Seated Cervical Retraction  - 3 x daily - 7 x weekly - 1 sets - 10 reps - Standing Scapular Retraction  - 3 x daily - 7 x weekly - 1 sets - 10 reps - 10 hold - Shoulder External Rotation and Scapular Retraction  - 3 x daily - 7 x weekly - 1 sets - 10 reps -   hold  Patient Education - Office Posture  ASSESSMENT:  CLINICAL IMPRESSION: Patient is a 57 y.o. female who was seen today for physical therapy evaluation and treatment for DDD thoracic spine; midback pain.   OBJECTIVE IMPAIRMENTS: decreased mobility, decreased ROM, decreased strength, hypomobility, increased fascial restrictions, increased muscle spasms, impaired UE functional use, improper body mechanics, postural dysfunction, obesity, and pain.   ACTIVITY LIMITATIONS: carrying, lifting, bending, sitting, squatting, stairs, toileting, and dressing  PARTICIPATION  LIMITATIONS: cleaning and occupation  PERSONAL FACTORS: Past/current experiences and comorbidities: HTN, obesity, sedentary lifestyle are also affecting patient's functional outcome.   REHAB POTENTIAL: Good  CLINICAL DECISION MAKING: Stable/uncomplicated  EVALUATION COMPLEXITY: Low   GOALS: Goals reviewed with patient? Yes  SHORT TERM GOALS: Target date: 11/02/2022   Independent in initial HEP  Baseline: Goal status: INITIAL  2.  Initiate postural education and correction  Baseline:  Goal status: INITIAL   LONG TERM GOALS: Target date: 11/30/2022   Improve posture and alignment with patient to demonstrate improved thoracic extension with posterior shoulder girdle musculature engaged  Baseline:  Goal status: INITIAL  2.  Increase lumbar and thoracic mobility to WFL's and pain free  Baseline:  Goal status: INITIAL  3.  Decrease pain by 75-100% allowing patient to return to all normal functional activities with minimal to no pain  Baseline:  Goal status: INITIAL  4.  Patient to demonstrate and/or verbalize appropriate changes for ergonomics for work and home activities  Baseline:  Goal status: INITIAL  5.  Independent in HEP including aquatic program as indicated  Baseline:  Goal status: INITIAL  6.  Improve functional limitation score to 66 Baseline:  Goal status: INITIAL  PLAN:  PT  FREQUENCY: 2x/week  PT DURATION: 8 weeks  PLANNED INTERVENTIONS: Therapeutic exercises, Therapeutic activity, Neuromuscular re-education, Patient/Family education, Self Care, Joint mobilization, Aquatic Therapy, Dry Needling, Electrical stimulation, Spinal mobilization, Cryotherapy, Moist heat, Taping, Traction, Ultrasound, Ionotophoresis 4mg /ml Dexamethasone, Manual therapy, and Re-evaluation.  PLAN FOR NEXT SESSION: review and progress exercises; continue with postural education and correction; manual work, DN, modalities as indicated    Ruqayyah Lute Rober Minion, PT 10/05/2022, 9:16 AM

## 2022-10-05 ENCOUNTER — Other Ambulatory Visit: Payer: Self-pay

## 2022-10-05 ENCOUNTER — Encounter: Payer: Self-pay | Admitting: Rehabilitative and Restorative Service Providers"

## 2022-10-05 ENCOUNTER — Ambulatory Visit: Payer: 59 | Attending: Physician Assistant | Admitting: Rehabilitative and Restorative Service Providers"

## 2022-10-05 DIAGNOSIS — R293 Abnormal posture: Secondary | ICD-10-CM

## 2022-10-05 DIAGNOSIS — M549 Dorsalgia, unspecified: Secondary | ICD-10-CM | POA: Diagnosis not present

## 2022-10-05 DIAGNOSIS — M5134 Other intervertebral disc degeneration, thoracic region: Secondary | ICD-10-CM

## 2022-10-05 DIAGNOSIS — M6281 Muscle weakness (generalized): Secondary | ICD-10-CM | POA: Diagnosis not present

## 2022-10-10 ENCOUNTER — Telehealth: Payer: Self-pay | Admitting: Rehabilitative and Restorative Service Providers"

## 2022-10-10 ENCOUNTER — Encounter: Payer: 59 | Admitting: Rehabilitative and Restorative Service Providers"

## 2022-10-10 NOTE — Telephone Encounter (Signed)
Michelle Huffman was scheduled for Physical Therapy appointment today at 7:15 and failed to show for appointment. Left message asking that she call to re-schedule appointment as needed.  Nastasha Reising P. Helene Kelp PT, MPH 10/10/22 7:47 AM

## 2022-10-12 ENCOUNTER — Encounter: Payer: 59 | Admitting: Rehabilitative and Restorative Service Providers"

## 2022-10-16 ENCOUNTER — Other Ambulatory Visit (HOSPITAL_COMMUNITY): Payer: Self-pay

## 2022-10-19 ENCOUNTER — Ambulatory Visit: Payer: 59

## 2022-10-19 DIAGNOSIS — M549 Dorsalgia, unspecified: Secondary | ICD-10-CM | POA: Diagnosis not present

## 2022-10-19 DIAGNOSIS — M6281 Muscle weakness (generalized): Secondary | ICD-10-CM

## 2022-10-19 DIAGNOSIS — R293 Abnormal posture: Secondary | ICD-10-CM | POA: Diagnosis not present

## 2022-10-19 DIAGNOSIS — M5134 Other intervertebral disc degeneration, thoracic region: Secondary | ICD-10-CM

## 2022-10-19 NOTE — Therapy (Addendum)
OUTPATIENT PHYSICAL THERAPY THORACOLUMBAR TREATMENT AND DISCHARGE SUMMARY   PHYSICAL THERAPY DISCHARGE SUMMARY  Visits from Start of Care: 2  Current functional level related to goals / functional outcomes: See progress note for discharge status - patient reported slight improvement    Remaining deficits: Unknown     Education / Equipment: HEP    Patient agrees to discharge. Patient goals were not met. Patient is being discharged due to not returning since the last visit.  Celyn P. Helene Kelp PT, MPH 12/06/22 3:56 PM   Patient Name: Michelle Huffman MRN: [id number] DOB:12/10/1965, 57 y.o., female Today's Date: 10/19/2022  END OF SESSION:  PT End of Session - 10/19/22 0716     PT Start Time 0709    PT Stop Time 0750    PT Time Calculation (min) 41 min             Past Medical History:  Diagnosis Date   Alpha thalassemia major ([place])    Anemia    Arthritis    Asthma    Autoimmune disease ([place])    Blood transfusion    Chest pain    Complication of anesthesia    Hypertension    Hypothyroidism    Lupus ([place])    PONV (postoperative nausea and vomiting)    Past Surgical History:  Procedure Laterality Date   ABDOMINAL HYSTERECTOMY  2009   CHOLECYSTECTOMY  07/21/11   KNEE SURGERY  2010   laproscopic right knee   LAPAROSCOPIC GASTRIC SLEEVE RESECTION N/A 08/19/2014   Procedure: LAPAROSCOPIC GASTRIC SLEEVE RESECTION;  Surgeon: Excell Seltzer, MD;  Location: [facility];  Service: General;  Laterality: N/A;   UPPER GI ENDOSCOPY  08/19/2014   Procedure: UPPER GI ENDOSCOPY;  Surgeon: Excell Seltzer, MD;  Location: [facility];  Service: General;;   Patient Active Problem List   Diagnosis Date Noted   DDD (degenerative disc disease), thoracic 09/14/2022   Mid back pain 09/13/2022   Elevated LDL cholesterol level 08/17/2022   Pre-diabetes 08/17/2022   Class 2 obesity due to excess calories without serious comorbidity with body mass index (BMI) of 37.0 to 37.9 in adult  08/16/2022   S/P laparoscopic sleeve gastrectomy 08/16/2022   Primary hypertension 08/16/2022   Prolactinoma ([place]) 08/16/2022   Microcytic anemia 08/16/2022   Systemic lupus erythematosus ([place]) 07/20/2022   Plantar fascial fibromatosis of right foot 07/20/2022   Primary osteoarthritis of right knee 07/20/2022   Mixed hyperlipidemia 03/31/2021   Iron deficiency 01/14/2016   Insomnia 11/17/2015   GAD (generalized anxiety disorder) 11/17/2015   Severe episode of recurrent major depressive disorder, without psychotic features ([place]) 11/17/2015   Alpha thalassemia trait 03/31/2015   Morbid obesity ([place]) 03/27/2014   Right shoulder pain 03/06/2014   Rheumatoid arthritis ([place]) 10/29/2013   ANA positive 10/17/2013   Asthma 10/17/2013   Elevated sed rate 10/17/2013   Recurrent bronchospasm 07/23/2013   Biliary dyskinesia 07/11/2011   PAROTIDITIS 10/11/2007   PITUITARY ADENOMA, BENIGN 07/25/2007   HYPOTHYROIDISM 05/03/2007   ANEMIA-IRON DEFICIENCY 05/03/2007    PCP: Donella Stade, PA-C  REFERRING PROVIDER: Donella Stade, PA-C  REFERRING DIAG: DDD thoracic spine   Rationale for Evaluation and Treatment: Rehabilitation  THERAPY DIAG:  DDD (degenerative disc disease), thoracic  Abnormal posture  Mid back pain  Muscle weakness (generalized)  ONSET DATE: 03/03/22  SUBJECTIVE:  SUBJECTIVE STATEMENT: Patient reports slight decrease in back pain since last visit. Patient is compliant with HEP.  PERTINENT HISTORY:  Denies any medical problems other than HTN  PAIN:  Are you having pain? Yes: NPRS scale: 4/10 Pain location: middle back  Pain description: nagging  Aggravating factors: pulling shoulders back; use of UE's  Relieving factors: muscle relaxant; being still   PRECAUTIONS:  None  WEIGHT BEARING RESTRICTIONS: No  FALLS:  Has patient fallen in last 6 months? No  LIVING ENVIRONMENT: Lives with: lives with their family Lives in: House/apartment Stairs: No   OCCUPATION: office at medical provider   PLOF: Independent  PATIENT GOALS: get rid of pain   NEXT MD VISIT:   OBJECTIVE:   DIAGNOSTIC FINDINGS:  Xrays 09/13/22 - Minimal degenerative joint changes of thoracic spine   PATIENT SURVEYS:  FOTO 50   COGNITION: Overall cognitive status: Within functional limits for tasks assessed     SENSATION: WFL   POSTURE: Patient presents with head forward posture with increased thoracic kyphosis; shoulders rounded and elevated; scapulae abducted and rotated along the thoracic spine; head of the humerus anterior in orientation.   PALPATION: Muscular tightness Rt > Lt thoracic paraspinals   LUMBAR ROM:   AROM eval  Flexion 70% pull with return to stand   Extension 20%  Right lateral flexion 85% discomfort   Left lateral flexion 85% discomfort   Right rotation 60% discomfort   Left rotation 60% mild discomfort    (Blank rows = not tested)  LOWER EXTREMITY ROM:     Tight hamstrings and piriformis Rt > Lt   LOWER EXTREMITY MMT:    UE/LE strength WFL's   LUMBAR SPECIAL TESTS:  Straight leg raise test: Negative and Slump test: muscular tightness and pain in the thoracic spine in area of tightness    TODAY'S TREATMENT:  [facility] Adult PT Treatment:                                                DATE: 10/19/2022 Therapeutic Exercise: LTR x10 S/L open books 10x3" Prone press up on elbows 10x3" Seated thoracic extension w/coregeous ball 5x5" Seated no monies RTB 2x10 (pool noodle at back) Door way pec stretch 3x30" Overhead doorway reach lat stretch 5x10" Supine + towel roll at mid back: 2#dowel: chest press, scap punches, shoulder flexion Seated rows RTB x10 Standing shoulder extension RTB x10 Seated UT & side bend stretches                                                                                                                                 [facility] Adult PT Treatment:  DATE: 10/05/22 Therapeutic Exercise: Chin tuck 10 sec x 5 sitting with noodle along spine  Scap squeeze 10 sec x 5 sitting with noodle along spine  Doorway stretch 3 positions 30 sec x 1  Manual Therapy: To add as needed  Neuromuscular re-ed: Postural correction/education - use of noodle for sitting  Therapeutic Activity: Modification of sitting postures and ergonomics  Modalities: TENS bilat thoracic paraspinals x 15 min (pt sitting) Moist heat thoracic spine x 15 min (pt sitting) Self Care: Ergonomics for sitting     PATIENT EDUCATION:  Education details: Review HEP; towel roll substitute for pool noodle ergonomics Person educated: Patient Education method: Explanation, Demonstration, Tactile cues, Verbal cues, and Handouts Education comprehension: verbalized understanding, returned demonstration, verbal cues required, tactile cues required, and needs further education  HOME EXERCISE PROGRAM: Access Code: [email or web address] URL: [email or web address] Date: 10/05/2022 Prepared by: Gillermo Murdoch  Exercises - Prone Press Up  - 2 x daily - 7 x weekly - 1 sets - 10 reps - 2-3 sec  hold - Prone Press Up On Elbows  - 2 x daily - 7 x weekly - 1 sets - 3 reps - 30 sec  hold - Prone Press Up On Elbows  - 2 x daily - 7 x weekly - 1 sets - 3 reps - 30 sec  hold - Standing Back Extension  - 2 x daily - 7 x weekly - 1 sets - 3 reps - 30 sec  hold - Doorway Pec Stretch at 60 Degrees Abduction  - 3 x daily - 7 x weekly - 1 sets - 3 reps - Doorway Pec Stretch at 90 Degrees Abduction  - 3 x daily - 7 x weekly - 1 sets - 3 reps - 30 seconds  hold - Doorway Pec Stretch at 120 Degrees Abduction  - 3 x daily - 7 x weekly - 1 sets - 3 reps - 30 second hold  hold - Seated Cervical Retraction  - 3 x daily - 7 x weekly -  1 sets - 10 reps - Standing Scapular Retraction  - 3 x daily - 7 x weekly - 1 sets - 10 reps - 10 hold - Shoulder External Rotation and Scapular Retraction  - 3 x daily - 7 x weekly - 1 sets - 10 reps -   hold  Patient Education - Office Posture  ASSESSMENT:  CLINICAL IMPRESSION: Interventions continued focusing on thoracic mobility and postural strengthening. Discussion with patient substituting towel roll in place of pool noodle for back support while at work. Verbal cues improved TA activation and decreased upper trapezius compensation during resisted shoulder extension and rows.    OBJECTIVE IMPAIRMENTS: decreased mobility, decreased ROM, decreased strength, hypomobility, increased fascial restrictions, increased muscle spasms, impaired UE functional use, improper body mechanics, postural dysfunction, obesity, and pain.   ACTIVITY LIMITATIONS: carrying, lifting, bending, sitting, squatting, stairs, toileting, and dressing  PARTICIPATION LIMITATIONS: cleaning and occupation  PERSONAL FACTORS: Past/current experiences and comorbidities: HTN, obesity, sedentary lifestyle are also affecting patient's functional outcome.   REHAB POTENTIAL: Good  CLINICAL DECISION MAKING: Stable/uncomplicated  EVALUATION COMPLEXITY: Low   GOALS: Goals reviewed with patient? Yes  SHORT TERM GOALS: Target date: 11/02/2022   Independent in initial HEP  Baseline: Goal status: INITIAL  2.  Initiate postural education and correction  Baseline:  Goal status: INITIAL   LONG TERM GOALS: Target date: 11/30/2022   Improve posture and alignment with patient to demonstrate improved thoracic extension with posterior shoulder girdle musculature engaged  Baseline:  Goal status: INITIAL  2.  Increase lumbar and thoracic mobility to WFL's and pain free  Baseline:  Goal status: INITIAL  3.  Decrease pain by 75-100% allowing patient to return to all normal functional activities with minimal to no pain   Baseline:  Goal status: INITIAL  4.  Patient to demonstrate and/or verbalize appropriate changes for ergonomics for work and home activities  Baseline:  Goal status: INITIAL  5.  Independent in HEP including aquatic program as indicated  Baseline:  Goal status: INITIAL  6.  Improve functional limitation score to 66 Baseline:  Goal status: INITIAL  PLAN:  PT FREQUENCY: 2x/week  PT DURATION: 8 weeks  PLANNED INTERVENTIONS: Therapeutic exercises, Therapeutic activity, Neuromuscular re-education, Patient/Family education, Self Care, Joint mobilization, Aquatic Therapy, Dry Needling, Electrical stimulation, Spinal mobilization, Cryotherapy, Moist heat, Taping, Traction, Ultrasound, Ionotophoresis '4mg'$ /ml Dexamethasone, Manual therapy, and Re-evaluation.  PLAN FOR NEXT SESSION: Progress exercises; continue with postural education and correction; manual work, DN, modalities as indicated    Hardin Negus, PTA 10/19/2022, 7:52 AM

## 2022-10-24 ENCOUNTER — Other Ambulatory Visit (HOSPITAL_BASED_OUTPATIENT_CLINIC_OR_DEPARTMENT_OTHER): Payer: Self-pay

## 2022-10-24 ENCOUNTER — Other Ambulatory Visit (HOSPITAL_COMMUNITY): Payer: Self-pay

## 2022-10-27 ENCOUNTER — Encounter: Payer: Self-pay | Admitting: Physician Assistant

## 2022-11-09 ENCOUNTER — Other Ambulatory Visit: Payer: Self-pay

## 2022-11-09 ENCOUNTER — Encounter (HOSPITAL_BASED_OUTPATIENT_CLINIC_OR_DEPARTMENT_OTHER): Payer: Self-pay

## 2022-11-09 ENCOUNTER — Emergency Department (HOSPITAL_BASED_OUTPATIENT_CLINIC_OR_DEPARTMENT_OTHER)
Admission: EM | Admit: 2022-11-09 | Discharge: 2022-11-09 | Disposition: A | Discharge status: Home / Self Care | Payer: 59 | Attending: Emergency Medicine | Admitting: Emergency Medicine

## 2022-11-09 DIAGNOSIS — I10 Essential (primary) hypertension: Secondary | ICD-10-CM | POA: Diagnosis not present

## 2022-11-09 DIAGNOSIS — R1013 Epigastric pain: Secondary | ICD-10-CM | POA: Diagnosis not present

## 2022-11-09 DIAGNOSIS — K529 Noninfective gastroenteritis and colitis, unspecified: Secondary | ICD-10-CM

## 2022-11-09 DIAGNOSIS — Z79899 Other long term (current) drug therapy: Secondary | ICD-10-CM | POA: Diagnosis not present

## 2022-11-09 DIAGNOSIS — R1110 Vomiting, unspecified: Secondary | ICD-10-CM | POA: Insufficient documentation

## 2022-11-09 DIAGNOSIS — R109 Unspecified abdominal pain: Secondary | ICD-10-CM | POA: Diagnosis present

## 2022-11-09 DIAGNOSIS — Z20822 Contact with and (suspected) exposure to covid-19: Secondary | ICD-10-CM | POA: Insufficient documentation

## 2022-11-09 DIAGNOSIS — R5383 Other fatigue: Secondary | ICD-10-CM | POA: Diagnosis not present

## 2022-11-09 LAB — CBC WITH DIFFERENTIAL/PLATELET
Abs Immature Granulocytes: 0.02 10*3/uL (ref 0.00–0.07)
Basophils Absolute: 0 10*3/uL (ref 0.0–0.1)
Basophils Relative: 0 %
Eosinophils Absolute: 0.1 10*3/uL (ref 0.0–0.5)
Eosinophils Relative: 1 %
HCT: 32.9 % — ABNORMAL LOW (ref 36.0–46.0)
Hemoglobin: 10 g/dL — ABNORMAL LOW (ref 12.0–15.0)
Immature Granulocytes: 0 %
Lymphocytes Relative: 17 %
Lymphs Abs: 1.3 10*3/uL (ref 0.7–4.0)
MCH: 20.7 pg — ABNORMAL LOW (ref 26.0–34.0)
MCHC: 30.4 g/dL (ref 30.0–36.0)
MCV: 68 fL — ABNORMAL LOW (ref 80.0–100.0)
Monocytes Absolute: 0.6 10*3/uL (ref 0.1–1.0)
Monocytes Relative: 7 %
Neutro Abs: 5.7 10*3/uL (ref 1.7–7.7)
Neutrophils Relative %: 75 %
Platelets: 238 10*3/uL (ref 150–400)
RBC: 4.84 MIL/uL (ref 3.87–5.11)
RDW: 18.4 % — ABNORMAL HIGH (ref 11.5–15.5)
WBC: 7.7 10*3/uL (ref 4.0–10.5)
nRBC: 0 % (ref 0.0–0.2)

## 2022-11-09 LAB — URINALYSIS, ROUTINE W REFLEX MICROSCOPIC
Bilirubin Urine: NEGATIVE
Glucose, UA: NEGATIVE mg/dL
Hgb urine dipstick: NEGATIVE
Ketones, ur: NEGATIVE mg/dL
Leukocytes,Ua: NEGATIVE
Nitrite: NEGATIVE
Specific Gravity, Urine: 1.015 (ref 1.005–1.030)
pH: 6 (ref 5.0–8.0)

## 2022-11-09 LAB — COMPREHENSIVE METABOLIC PANEL
ALT: 21 U/L (ref 0–44)
AST: 27 U/L (ref 15–41)
Albumin: 4.9 g/dL (ref 3.5–5.0)
Alkaline Phosphatase: 71 U/L (ref 38–126)
Anion gap: 13 (ref 5–15)
BUN: 14 mg/dL (ref 6–20)
CO2: 24 mmol/L (ref 22–32)
Calcium: 10.5 mg/dL — ABNORMAL HIGH (ref 8.9–10.3)
Chloride: 104 mmol/L (ref 98–111)
Creatinine, Ser: 0.87 mg/dL (ref 0.44–1.00)
GFR, Estimated: >60 mL/min (ref >60)
Glucose, Bld: 104 mg/dL — ABNORMAL HIGH (ref 70–99)
Potassium: 3.3 mmol/L — ABNORMAL LOW (ref 3.5–5.1)
Sodium: 141 mmol/L (ref 135–145)
Total Bilirubin: 0.4 mg/dL (ref 0.3–1.2)
Total Protein: 9.7 g/dL — ABNORMAL HIGH (ref 6.5–8.1)

## 2022-11-09 LAB — RESP PANEL BY RT-PCR (RSV, FLU A&B, COVID)  RVPGX2
Influenza A by PCR: NEGATIVE
Influenza B by PCR: NEGATIVE
Resp Syncytial Virus by PCR: NEGATIVE
SARS Coronavirus 2 by RT PCR: NEGATIVE

## 2022-11-09 LAB — LIPASE, BLOOD: Lipase: 25 U/L (ref 11–51)

## 2022-11-09 MED ORDER — ONDANSETRON HCL 4 MG/2ML IJ SOLN
4.0000 mg | Freq: Once | INTRAMUSCULAR | Status: AC
  Administered 2022-11-09: 4 mg via INTRAVENOUS
  Filled 2022-11-09: qty 2

## 2022-11-09 MED ORDER — KETOROLAC TROMETHAMINE 30 MG/ML IJ SOLN
30.0000 mg | Freq: Once | INTRAMUSCULAR | Status: AC
  Administered 2022-11-09: 30 mg via INTRAVENOUS
  Filled 2022-11-09: qty 1

## 2022-11-09 MED ORDER — SODIUM CHLORIDE 0.9 % IV BOLUS
1000.0000 mL | Freq: Once | INTRAVENOUS | Status: AC
  Administered 2022-11-09: 1000 mL via INTRAVENOUS

## 2022-11-09 MED ORDER — ONDANSETRON 8 MG PO TBDP: ORAL_TABLET | ORAL | 0 refills | Status: DC

## 2022-11-09 NOTE — Discharge Instructions (Addendum)
Clear liquid diet for the next 12 hours, then slowly advance to normal as tolerated.  Take Zofran as prescribed as needed for nausea.  Return to the emergency department if you develop severe abdominal pain, bloody stools, or for other new and concerning symptoms.

## 2022-11-09 NOTE — ED Triage Notes (Addendum)
Pt c/o abdominal pain started this afternoon, after drinking orange juice "Feel I may be dehydrated" +nausea, no vomiting  +diarrhea No other family members have same symptoms

## 2022-11-09 NOTE — ED Provider Notes (Signed)
[facility] Provider Note   CSN: [id number] Arrival date & time: 11/09/22  0005     History  Chief Complaint  Patient presents with   Abdominal Pain    Michelle Huffman is a 57 y.o. female.  Patient is a 57 year old female with past medical history of hypertension, lupus, prior cholecystectomy, hyperlipidemia.  Patient presenting today with complaints of abdominal pain and vomiting.  This started earlier this evening after drinking a glass of orange juice.  She says she felt somewhat fatigued throughout the day, then started acutely with the above symptoms.  She denies any diarrhea.  She denies any fevers or chills.  She denies any ill contacts or having consumed any suspicious foods.  The history is provided by the patient.       Home Medications Prior to Admission medications   Medication Sig Start Date End Date Taking? Authorizing Provider  albuterol (PROVENTIL HFA;VENTOLIN HFA) 108 (90 Base) MCG/ACT inhaler Inhale 2 puffs into the lungs every 6 (six) hours as needed for shortness of breath.    [provider]  buPROPion ER (WELLBUTRIN SR) 100 MG 12 hr tablet Take 1 tablet (100 mg total) by mouth 2 (two) times daily. 09/27/22   Breeback, Royetta Car, PA-C  cyclobenzaprine (FLEXERIL) 10 MG tablet Take 1 tablet (10 mg total) by mouth 3 (three) times daily as needed for muscle spasms. 09/13/22   Breeback, Royetta Car, PA-C  hydroxychloroquine (PLAQUENIL) 200 MG tablet Take 1 tablet (200 mg total) by mouth 2 (two) times daily. 09/27/22   Breeback, Royetta Car, PA-C  lisinopril-hydrochlorothiazide (ZESTORETIC) 10-12.5 MG tablet Take 1 tablet by mouth daily. 09/27/22   Breeback, Royetta Car, PA-C  meloxicam (MOBIC) 15 MG tablet Take 1 tablet (15 mg total) by mouth daily. 09/19/22   Breeback, Jade L, PA-C  metoprolol tartrate (LOPRESSOR) 25 MG tablet Take 25 mg by mouth daily as needed.    [provider]  ondansetron (ZOFRAN) 4 MG tablet Take  1-2 tablets (4-8 mg total) by mouth every 8 (eight) hours as needed for nausea or vomiting. 11/18/18   Raylene Everts, MD  rosuvastatin (CRESTOR) 10 MG tablet Take 1 tablet (10 mg total) by mouth daily. 09/27/22   Breeback, Jade L, PA-C  tirzepatide [facility]) 5 MG/0.5ML Pen Inject 5 mg into the skin once a week. 09/23/22   Breeback, Jade L, PA-C  tirzepatide [facility]) 7.5 MG/0.5ML Pen Inject 7.5 mg into the skin once a week. 10/14/22   Donella Stade, PA-C      Allergies    Sulfa antibiotics    Review of Systems   Review of Systems  All other systems reviewed and are negative.   Physical Exam Updated Vital Signs BP (!) 138/97 (BP Location: Right Arm)   Pulse 85   Temp 97.9 F (36.6 C) (Oral)   Resp 18   Ht '5\' 4"'$  (1.626 m)   Wt 86.2 kg   SpO2 99%   BMI 32.61 kg/m  Physical Exam Vitals and nursing note reviewed.  Constitutional:      General: She is not in acute distress.    Appearance: She is well-developed. She is not diaphoretic.  HENT:     Head: Normocephalic and atraumatic.  Cardiovascular:     Rate and Rhythm: Normal rate and regular rhythm.     Heart sounds: No murmur heard.    No friction rub. No gallop.  Pulmonary:     Effort: Pulmonary effort is  normal. No respiratory distress.     Breath sounds: Normal breath sounds. No wheezing.  Abdominal:     General: Abdomen is flat. Bowel sounds are normal. There is no distension.     Palpations: Abdomen is soft.     Tenderness: There is abdominal tenderness in the epigastric area. There is no right CVA tenderness, left CVA tenderness, guarding or rebound.  Musculoskeletal:        General: Normal range of motion.     Cervical back: Normal range of motion and neck supple.  Skin:    General: Skin is warm and dry.  Neurological:     General: No focal deficit present.     Mental Status: She is alert and oriented to person, place, and time.     ED Results / Procedures / Treatments   Labs (all labs ordered are  listed, but only abnormal results are displayed) Labs Reviewed  CBC WITH DIFFERENTIAL/PLATELET - Abnormal; Notable for the following components:      Result Value   Hemoglobin 10.0 (*)    HCT 32.9 (*)    MCV 68.0 (*)    MCH 20.7 (*)    RDW 18.4 (*)    All other components within normal limits  COMPREHENSIVE METABOLIC PANEL - Abnormal; Notable for the following components:   Potassium 3.3 (*)    Glucose, Bld 104 (*)    Calcium 10.5 (*)    Total Protein 9.7 (*)    All other components within normal limits  URINALYSIS, ROUTINE W REFLEX MICROSCOPIC - Abnormal; Notable for the following components:   Protein, ur TRACE (*)    All other components within normal limits  RESP PANEL BY RT-PCR (RSV, FLU A&B, COVID)  RVPGX2  LIPASE, BLOOD    EKG None  Radiology No results found.  Procedures Procedures    Medications Ordered in ED Medications  sodium chloride 0.9 % bolus 1,000 mL (has no administration in time range)  ondansetron (ZOFRAN) injection 4 mg (has no administration in time range)  ketorolac (TORADOL) 30 MG/ML injection 30 mg (has no administration in time range)    ED Course/ Medical Decision Making/ A&P  Patient presenting with complaints of nausea, vomiting, and diarrhea as described in the HPI.  She arrives here with stable vital signs and physical examination which is basically unremarkable.  She appears well-hydrated.  She does have mild epigastric tenderness, but with no peritoneal signs.  Workup initiated including a CBC, CMP, lipase, all of which were unremarkable.  There is no leukocytosis and no elevation of her LFTs or lipase.  COVID/flu/RSV swab negative.  Urinalysis is clear.  Patient hydrated with normal saline and given Toradol for pain and Zofran for nausea.  She is now feeling significantly improved.  She has been ambulatory in the department to the bathroom without difficulty states that she feels much better.  Patient's presentation, physical  examination, and test results most consistent with a viral or foodborne gastroenteritis.  She will be discharged with Zofran and return as needed.  I highly doubt acute intra-abdominal pathology such as appendicitis or bowel obstruction.  She is status postcholecystectomy and there is no evidence for a biliary issue.  Final Clinical Impression(s) / ED Diagnoses Final diagnoses:  None    Rx / DC Orders ED Discharge Orders     None         Veryl Speak, MD 11/09/22 [phone]

## 2022-11-11 ENCOUNTER — Telehealth: Payer: Self-pay | Admitting: General Practice

## 2022-11-11 ENCOUNTER — Ambulatory Visit (INDEPENDENT_AMBULATORY_CARE_PROVIDER_SITE_OTHER): Payer: 59 | Admitting: Physician Assistant

## 2022-11-11 ENCOUNTER — Encounter: Payer: Self-pay | Admitting: Physician Assistant

## 2022-11-11 ENCOUNTER — Other Ambulatory Visit (HOSPITAL_BASED_OUTPATIENT_CLINIC_OR_DEPARTMENT_OTHER): Payer: Self-pay

## 2022-11-11 VITALS — BP 115/83 | HR 83 | Ht 64.0 in | Wt 201.0 lb

## 2022-11-11 DIAGNOSIS — R1013 Epigastric pain: Secondary | ICD-10-CM | POA: Diagnosis not present

## 2022-11-11 DIAGNOSIS — K2900 Acute gastritis without bleeding: Secondary | ICD-10-CM | POA: Diagnosis not present

## 2022-11-11 MED ORDER — OMEPRAZOLE 40 MG PO CPDR
40.0000 mg | DELAYED_RELEASE_CAPSULE | Freq: Every day | ORAL | 0 refills | Status: DC
  Filled 2022-11-11: qty 30, 30d supply, fill #0

## 2022-11-11 NOTE — Telephone Encounter (Signed)
Transition Care Management Follow-up Telephone Call Date of discharge and from where: 11/09/22 from [facility] center How have you been since you were released from the hospital? Patient states that she is still in a lot of epigastric pain. Denies any nausea and vomiting. She is not passing gas and last her BM was two days ago. Prior to this, she had diarrhea.  Any questions or concerns? No  Items Reviewed: Did the pt receive and understand the discharge instructions provided? Yes  Medications obtained and verified? Yes  Other? No  Any new allergies since your discharge? No  Dietary orders reviewed? Yes Do you have support at home? Yes   Home Care and Equipment/Supplies: Were home health services ordered? no   Functional Questionnaire: (I = Independent and D = Dependent) ADLs: I  Bathing/Dressing- I  Meal Prep- I  Eating- I  Maintaining continence- I  Transferring/Ambulation- I  Managing Meds- I  Follow up appointments reviewed:  PCP Hospital f/u appt confirmed? Yes scheduled to see PCP today at [facility] f/u appt confirmed? No   Are transportation arrangements needed? No  If their condition worsens, is the pt aware to call PCP or go to the Emergency Dept.? Yes Was the patient provided with contact information for the PCP's office or ED? Yes Was to pt encouraged to call back with questions or concerns? Yes

## 2022-11-11 NOTE — Patient Instructions (Signed)
Gastritis, Adult Gastritis is inflammation of the stomach. There are two kinds of gastritis: Acute gastritis. This kind develops suddenly. Chronic gastritis. This kind is much more common. It develops slowly and lasts for a long time. Gastritis happens when the lining of the stomach becomes weak or gets damaged. Without treatment, gastritis can lead to stomach bleeding and ulcers. What are the causes? This condition may be caused by: An infection. Drinking too much alcohol. Certain medicines. These include steroids, antibiotics, and some over-the-counter medicines, such as aspirin or ibuprofen. Having too much acid in the stomach. Having a disease of the stomach. Other causes may include: An allergic reaction. Some cancer treatments (radiation). Smoking cigarettes or the use of products that contain nicotine or tobacco. In some cases, the cause of this condition is not known. What increases the risk? Having a disease of the intestines. Having a disease in which the body's immune system attacks the body (autoimmune disease), such as Crohn's disease. Using aspirin or ibuprofen and other NSAIDs to treat other conditions, such as heart disease or chronic pain. Stress. What are the signs or symptoms? Symptoms of this condition include: Pain or a burning sensation in the upper abdomen. Nausea. Vomiting. An uncomfortable feeling of fullness after eating. Weight loss. Bad breath. Blood in your vomit or stool (feces). In some cases, there are no symptoms. How is this diagnosed? This condition may be diagnosed based on your medical history, a physical exam, and tests. Tests may include: Your medical history and a description of your symptoms. A physical exam. Tests. These can include: Blood tests. Stool tests. A test in which a thin, flexible instrument with a light and a camera is passed down the esophagus and into the stomach (upper endoscopy). A test in which a tissue sample is  removed to look at it under a microscope (biopsy). How is this treated? This condition may be treated with medicines. The medicines that are used vary depending on the cause of the gastritis. If the condition is caused by a bacterial infection, you may be given antibiotic medicines. If the condition is caused by too much acid in the stomach, you may be given medicines called H2 blockers, proton pump inhibitors, or antacids. Treatment may also involve stopping the use of certain medicines such as aspirin or ibuprofen and other NSAIDs. Follow these instructions at home: Medicines Take over-the-counter and prescription medicines only as told by your health care provider. If you were prescribed an antibiotic medicine, take it as told by your health care provider. Do not stop taking the antibiotic even if you start to feel better. Alcohol use Do not drink alcohol if: Your health care provider tells you not to drink. You are pregnant, may be pregnant, or are planning to become pregnant. If you drink alcohol: Limit your use to: 0-1 drink a day for women. 0-2 drinks a day for men. Know how much alcohol is in your drink. In the [place] one drink equals one 12 oz bottle of beer (355 mL), one 5 oz glass of wine (148 mL), or one 1 oz glass of hard liquor (44 mL). General instructions  Eat small, frequent meals instead of large meals. Avoid foods and drinks that make your symptoms worse. Talk with your health care provider about ways to manage stress, such as getting regular exercise or practicing deep breathing, meditation, or yoga. Do not use any products that contain nicotine or tobacco. These products include cigarettes, chewing tobacco, and vaping devices, such as e-cigarettes.  If you need help quitting, ask your health care provider. Drink enough fluid to keep your urine pale yellow. Keep all follow-up visits. This is important. Contact a health care provider if: Your symptoms get worse. Your  abdominal pain gets worse. Your symptoms return after treatment. You have a fever. Get help right away if: You vomit blood or a substance that looks like coffee grounds. You have black or dark red stools. You are unable to keep fluids down. These symptoms may represent a serious problem that is an emergency. Do not wait to see if the symptoms will go away. Get medical help right away. Call your local emergency services (911 in the [place] Do not drive yourself to the hospital. Summary Gastritis is inflammation of the lining of the stomach that can occur suddenly (acute) or develop slowly over time (chronic). This condition is diagnosed with a medical history, a physical exam, or tests. This condition may be treated with medicines to treat infection or medicines to reduce the amount of acid in your stomach. Follow your health care provider's instructions about taking medicines, making changes to your diet, and knowing when to call for help. This information is not intended to replace advice given to you by your health care provider. Make sure you discuss any questions you have with your health care provider. Document Revised: 01/23/2021 Document Reviewed: 01/23/2021 Elsevier Patient Education  [place].

## 2022-11-11 NOTE — Progress Notes (Signed)
Established Patient Office Visit  Subjective   Patient ID: Michelle Huffman, female    DOB: [birth year]  Age: 57 y.o. MRN: [id number]  Chief Complaint  Patient presents with   Hospitalization Follow-up    HPI Pt is a 57 yo female with PMH of HTN, Lupus, HLD who went to ED for abdominal pain and vomiting on 11/09/2022. Pain started after drinking orange juice. Hemoglobin was low at 10.0. potassium at 3.3. Trace protein in urine. Negative respiratory panel. Pt was given normal saline and toradol for pain and zofran for nausea. She started feeling better in ED. No imaging done in ED. She was suspected to have viral gastroenteritis. She continues to have some epigastric pain but not as severe. Still no change in bowel movements. She is not on PPI. No signs of acid reflux.    .. Active Ambulatory Problems    Diagnosis Date Noted   PITUITARY ADENOMA, BENIGN 07/25/2007   HYPOTHYROIDISM 05/03/2007   ANEMIA-IRON DEFICIENCY 05/03/2007   PAROTIDITIS 10/11/2007   Biliary dyskinesia 07/11/2011   Right shoulder pain 03/06/2014   Morbid obesity ([place]) 03/27/2014   Insomnia 11/17/2015   GAD (generalized anxiety disorder) 11/17/2015   Severe episode of recurrent major depressive disorder, without psychotic features ([place]) 11/17/2015   Systemic lupus erythematosus ([place]) 07/20/2022   Plantar fascial fibromatosis of right foot 07/20/2022   Primary osteoarthritis of right knee 07/20/2022   Class 2 obesity due to excess calories without serious comorbidity with body mass index (BMI) of 37.0 to 37.9 in adult 08/16/2022   S/P laparoscopic sleeve gastrectomy 08/16/2022   Primary hypertension 08/16/2022   Alpha thalassemia trait 03/31/2015   ANA positive 10/17/2013   Asthma 10/17/2013   Elevated sed rate 10/17/2013   Iron deficiency 01/14/2016   Mixed hyperlipidemia 03/31/2021   Prolactinoma ([place]) 08/16/2022   Recurrent bronchospasm 07/23/2013   Rheumatoid arthritis ([place]) 10/29/2013   Microcytic anemia  08/16/2022   Elevated LDL cholesterol level 08/17/2022   Pre-diabetes 08/17/2022   Mid back pain 09/13/2022   DDD (degenerative disc disease), thoracic 09/14/2022   Resolved Ambulatory Problems    Diagnosis Date Noted   No Resolved Ambulatory Problems   Past Medical History:  Diagnosis Date   Alpha thalassemia major ([place])    Anemia    Arthritis    Autoimmune disease ([place])    Blood transfusion    Chest pain    Complication of anesthesia    Hypertension    Hypothyroidism    Lupus ([place])    PONV (postoperative nausea and vomiting)      ROS   See HPI.  Objective:     BP 115/83   Pulse 83   Ht 5' 4"$  (1.626 m)   Wt 201 lb (91.2 kg)   SpO2 92%   BMI 34.50 kg/m  BP Readings from Last 3 Encounters:  11/11/22 115/83  11/09/22 124/82  09/15/22 132/88   Wt Readings from Last 3 Encounters:  11/11/22 201 lb (91.2 kg)  11/09/22 190 lb (86.2 kg)  09/15/22 210 lb (95.3 kg)      Physical Exam Constitutional:      Appearance: Normal appearance. She is obese.  HENT:     Head: Normocephalic.  Cardiovascular:     Rate and Rhythm: Normal rate and regular rhythm.     Pulses: Normal pulses.     Heart sounds: Normal heart sounds.  Pulmonary:     Effort: Pulmonary effort is normal.     Breath sounds: Normal breath  sounds.  Abdominal:     General: Bowel sounds are normal. There is no distension.     Palpations: Abdomen is soft. There is no mass.     Tenderness: There is abdominal tenderness. There is no right CVA tenderness, left CVA tenderness, guarding or rebound.     Hernia: No hernia is present.  Musculoskeletal:     Right lower leg: No edema.     Left lower leg: No edema.  Neurological:     General: No focal deficit present.     Mental Status: She is alert and oriented to person, place, and time.  Psychiatric:        Mood and Affect: Mood normal.        Behavior: Behavior normal.        Assessment & Plan:  Marland KitchenMarland KitchenDenita was seen today for hospitalization  follow-up.  Diagnoses and all orders for this visit:  Epigastric pain -     omeprazole (PRILOSEC) 40 MG capsule; Take 1 capsule (40 mg total) by mouth daily. -     H. pylori breath test  Acute gastritis without hemorrhage, unspecified gastritis type -     omeprazole (PRILOSEC) 40 MG capsule; Take 1 capsule (40 mg total) by mouth daily. -     H. pylori breath test   ? If gastritis not on PPI will check for h.pylori with breath test Start omeprazole daily Keep GERD diet Follow up as needed or if symptoms change or worsen Stay on PPI for at least 2 weeks   Iran Planas, PA-C

## 2022-11-15 ENCOUNTER — Other Ambulatory Visit (HOSPITAL_BASED_OUTPATIENT_CLINIC_OR_DEPARTMENT_OTHER): Payer: Self-pay

## 2022-11-15 ENCOUNTER — Other Ambulatory Visit: Payer: Self-pay | Admitting: Physician Assistant

## 2022-11-15 DIAGNOSIS — I10 Essential (primary) hypertension: Secondary | ICD-10-CM

## 2022-11-15 DIAGNOSIS — E6609 Other obesity due to excess calories: Secondary | ICD-10-CM

## 2022-11-15 DIAGNOSIS — R7303 Prediabetes: Secondary | ICD-10-CM

## 2022-11-15 MED ORDER — MOUNJARO 7.5 MG/0.5ML ~~LOC~~ SOAJ
7.5000 mg | SUBCUTANEOUS | 0 refills | Status: DC
  Filled 2022-11-15 – 2022-11-16 (×2): qty 2, 28d supply, fill #0

## 2022-11-16 ENCOUNTER — Other Ambulatory Visit (HOSPITAL_BASED_OUTPATIENT_CLINIC_OR_DEPARTMENT_OTHER): Payer: Self-pay

## 2022-11-16 ENCOUNTER — Other Ambulatory Visit: Payer: Self-pay

## 2022-11-16 LAB — H. PYLORI BREATH TEST: H. pylori Breath Test: NOT DETECTED

## 2022-11-16 NOTE — Progress Notes (Signed)
Negative for h.pylori. treatment plan with medications stays the same.

## 2022-12-06 ENCOUNTER — Ambulatory Visit (INDEPENDENT_AMBULATORY_CARE_PROVIDER_SITE_OTHER): Payer: 59 | Admitting: Physician Assistant

## 2022-12-06 DIAGNOSIS — Z23 Encounter for immunization: Secondary | ICD-10-CM | POA: Diagnosis not present

## 2022-12-06 NOTE — Progress Notes (Signed)
Agree with above plan. 

## 2022-12-06 NOTE — Progress Notes (Signed)
   Established Patient Office Visit  Subjective   Patient ID: Michelle Huffman, female    DOB: 09/10/1966  Age: 57 y.o. MRN: [id number]  Chief Complaint  Patient presents with   Immunizations    HPI  Genever L Mcmeekin is here for last shingles vaccine.   ROS    Objective:     There were no vitals taken for this visit.   Physical Exam   No results found for any visits on 12/06/22.    The 10-year ASCVD risk score (Arnett DK, et al., 2019) is: 4.9%    Assessment & Plan:  Shingles vaccine - Patient tolerated injection well without complications.    Problem List Items Addressed This Visit   None Visit Diagnoses     Need for shingles vaccine    -  Primary   Relevant Orders   Varicella-zoster vaccine IM (Completed)       No follow-ups on file.    Lavell Luster, [place]

## 2022-12-09 ENCOUNTER — Other Ambulatory Visit: Payer: Self-pay | Admitting: Physician Assistant

## 2022-12-09 DIAGNOSIS — E6609 Other obesity due to excess calories: Secondary | ICD-10-CM

## 2022-12-09 DIAGNOSIS — I10 Essential (primary) hypertension: Secondary | ICD-10-CM

## 2022-12-09 DIAGNOSIS — R7303 Prediabetes: Secondary | ICD-10-CM

## 2022-12-11 ENCOUNTER — Encounter: Payer: Self-pay | Admitting: Physician Assistant

## 2022-12-12 ENCOUNTER — Encounter: Payer: Self-pay | Admitting: Physician Assistant

## 2022-12-12 ENCOUNTER — Other Ambulatory Visit: Payer: Self-pay

## 2022-12-12 ENCOUNTER — Other Ambulatory Visit: Payer: Self-pay | Admitting: Physician Assistant

## 2022-12-12 ENCOUNTER — Other Ambulatory Visit (HOSPITAL_COMMUNITY): Payer: Self-pay

## 2022-12-12 ENCOUNTER — Other Ambulatory Visit (HOSPITAL_BASED_OUTPATIENT_CLINIC_OR_DEPARTMENT_OTHER): Payer: Self-pay

## 2022-12-12 DIAGNOSIS — R1013 Epigastric pain: Secondary | ICD-10-CM

## 2022-12-12 DIAGNOSIS — K2900 Acute gastritis without bleeding: Secondary | ICD-10-CM

## 2022-12-12 MED ORDER — MOUNJARO 7.5 MG/0.5ML ~~LOC~~ SOAJ
7.5000 mg | SUBCUTANEOUS | 0 refills | Status: DC
  Filled 2022-12-12: qty 2, 28d supply, fill #0

## 2022-12-12 MED ORDER — OMEPRAZOLE 40 MG PO CPDR
40.0000 mg | DELAYED_RELEASE_CAPSULE | Freq: Every day | ORAL | 1 refills | Status: DC
  Filled 2022-12-12 – 2022-12-15 (×2): qty 90, 90d supply, fill #0

## 2022-12-13 ENCOUNTER — Ambulatory Visit: Payer: Self-pay | Admitting: Physician Assistant

## 2022-12-14 ENCOUNTER — Other Ambulatory Visit (HOSPITAL_BASED_OUTPATIENT_CLINIC_OR_DEPARTMENT_OTHER): Payer: Self-pay

## 2022-12-15 ENCOUNTER — Other Ambulatory Visit (HOSPITAL_COMMUNITY): Payer: Self-pay

## 2022-12-16 ENCOUNTER — Ambulatory Visit (INDEPENDENT_AMBULATORY_CARE_PROVIDER_SITE_OTHER): Payer: 59 | Admitting: Physician Assistant

## 2022-12-16 ENCOUNTER — Other Ambulatory Visit (HOSPITAL_BASED_OUTPATIENT_CLINIC_OR_DEPARTMENT_OTHER): Payer: Self-pay

## 2022-12-16 ENCOUNTER — Encounter: Payer: Self-pay | Admitting: Physician Assistant

## 2022-12-16 VITALS — BP 128/82 | HR 72 | Ht 64.0 in | Wt 192.0 lb

## 2022-12-16 DIAGNOSIS — E785 Hyperlipidemia, unspecified: Secondary | ICD-10-CM

## 2022-12-16 DIAGNOSIS — R7303 Prediabetes: Secondary | ICD-10-CM

## 2022-12-16 DIAGNOSIS — Z6832 Body mass index (BMI) 32.0-32.9, adult: Secondary | ICD-10-CM

## 2022-12-16 DIAGNOSIS — I10 Essential (primary) hypertension: Secondary | ICD-10-CM

## 2022-12-16 DIAGNOSIS — E6609 Other obesity due to excess calories: Secondary | ICD-10-CM

## 2022-12-16 DIAGNOSIS — Z9884 Bariatric surgery status: Secondary | ICD-10-CM

## 2022-12-16 MED ORDER — LISINOPRIL-HYDROCHLOROTHIAZIDE 10-12.5 MG PO TABS
1.0000 | ORAL_TABLET | Freq: Every day | ORAL | 1 refills | Status: DC
  Filled 2022-12-16 – 2023-02-20 (×2): qty 90, 90d supply, fill #0

## 2022-12-16 MED ORDER — MOUNJARO 10 MG/0.5ML ~~LOC~~ SOAJ
10.0000 mg | SUBCUTANEOUS | 0 refills | Status: DC
  Filled 2022-12-16: qty 2, 28d supply, fill #0
  Filled 2022-12-21: qty 6, 84d supply, fill #0
  Filled 2023-01-04 – 2023-01-05 (×3): qty 2, 28d supply, fill #0
  Filled 2023-01-30: qty 2, 28d supply, fill #1
  Filled 2023-02-20 – 2023-02-22 (×3): qty 2, 28d supply, fill #2

## 2022-12-16 NOTE — Progress Notes (Signed)
Established Patient Office Visit  Subjective   Patient ID: KANSAS SCREEN, female    DOB: Apr 10, 1966  Age: 57 y.o. MRN: [id number]  Chief Complaint  Patient presents with   Follow-up    HPI Pt is a 57 yo obese female with pre-diabetes, HTN, HLD who presents to the clinic for medication follow up.   She is doing well on mounjaro. She is eating less and making better choices. She has started exercising more with walking. She denies any CP, palpitations, headaches. She is not having any GI symptoms. She has lost 9lbs in the last month.   .. Active Ambulatory Problems    Diagnosis Date Noted   PITUITARY ADENOMA, BENIGN 07/25/2007   HYPOTHYROIDISM 05/03/2007   ANEMIA-IRON DEFICIENCY 05/03/2007   PAROTIDITIS 10/11/2007   Biliary dyskinesia 07/11/2011   Right shoulder pain 03/06/2014   Morbid obesity ([place]) 03/27/2014   Insomnia 11/17/2015   GAD (generalized anxiety disorder) 11/17/2015   Severe episode of recurrent major depressive disorder, without psychotic features ([place]) 11/17/2015   Systemic lupus erythematosus ([place]) 07/20/2022   Plantar fascial fibromatosis of right foot 07/20/2022   Primary osteoarthritis of right knee 07/20/2022   Class 2 obesity due to excess calories without serious comorbidity with body mass index (BMI) of 37.0 to 37.9 in adult 08/16/2022   S/P laparoscopic sleeve gastrectomy 08/16/2022   Primary hypertension 08/16/2022   Alpha thalassemia trait 03/31/2015   ANA positive 10/17/2013   Asthma 10/17/2013   Elevated sed rate 10/17/2013   Iron deficiency 01/14/2016   Mixed hyperlipidemia 03/31/2021   Prolactinoma ([place]) 08/16/2022   Recurrent bronchospasm 07/23/2013   Rheumatoid arthritis ([place]) 10/29/2013   Microcytic anemia 08/16/2022   Elevated LDL cholesterol level 08/17/2022   Pre-diabetes 08/17/2022   Mid back pain 09/13/2022   DDD (degenerative disc disease), thoracic 09/14/2022   Resolved Ambulatory Problems    Diagnosis Date Noted   No  Resolved Ambulatory Problems   Past Medical History:  Diagnosis Date   Alpha thalassemia major ([place])    Anemia    Arthritis    Autoimmune disease ([place])    Blood transfusion    Chest pain    Complication of anesthesia    Hypertension    Hypothyroidism    Lupus ([place])    PONV (postoperative nausea and vomiting)      Review of Systems  All other systems reviewed and are negative.     Objective:     BP 128/82   Pulse 72   Ht 5\' 4"  (1.626 m)   Wt 192 lb (87.1 kg)   SpO2 96%   BMI 32.96 kg/m  BP Readings from Last 3 Encounters:  12/16/22 128/82  11/11/22 115/83  11/09/22 124/82   Wt Readings from Last 3 Encounters:  12/16/22 192 lb (87.1 kg)  11/11/22 201 lb (91.2 kg)  11/09/22 190 lb (86.2 kg)      Physical Exam Constitutional:      Appearance: Normal appearance. She is obese.  HENT:     Head: Normocephalic.  Cardiovascular:     Rate and Rhythm: Normal rate and regular rhythm.  Pulmonary:     Effort: Pulmonary effort is normal.  Abdominal:     General: There is no distension.     Palpations: Abdomen is soft. There is no mass.     Tenderness: There is no abdominal tenderness. There is no right CVA tenderness, left CVA tenderness, guarding or rebound.     Hernia: No hernia is present.  Neurological:     General: No focal deficit present.     Mental Status: She is alert.  Psychiatric:        Mood and Affect: Mood normal.      The 10-year ASCVD risk score (Arnett DK, et al., 2019) is: 7.1%    Assessment & Plan:  Marland KitchenMarland KitchenDenita was seen today for follow-up.  Diagnoses and all orders for this visit:  Pre-diabetes -     tirzepatide (MOUNJARO) 10 MG/0.5ML Pen; Inject 10 mg into the skin once a week.  Class 1 obesity due to excess calories without serious comorbidity with body mass index (BMI) of 32.0 to 32.9 in adult -     tirzepatide [facility]) 10 MG/0.5ML Pen; Inject 10 mg into the skin once a week.  Primary hypertension -      lisinopril-hydrochlorothiazide (ZESTORETIC) 10-12.5 MG tablet; Take 1 tablet by mouth daily. -     tirzepatide (MOUNJARO) 10 MG/0.5ML Pen; Inject 10 mg into the skin once a week.  Hyperlipidemia, unspecified hyperlipidemia type -     tirzepatide (MOUNJARO) 10 MG/0.5ML Pen; Inject 10 mg into the skin once a week.  S/P laparoscopic sleeve gastrectomy -     tirzepatide (MOUNJARO) 10 MG/0.5ML Pen; Inject 10 mg into the skin once a week.   Pt is doing well and tolerating medications well. No GI symptoms Continue diet and exercise Increased mounjaro to 10mg  weekly Continue zestoretic 2nd BP looked much better Follow up in 3 months for A1c and weight check  She brings in health maintaines to go over and update EMR.     Iran Planas, PA-C

## 2022-12-21 ENCOUNTER — Other Ambulatory Visit (HOSPITAL_BASED_OUTPATIENT_CLINIC_OR_DEPARTMENT_OTHER): Payer: Self-pay

## 2022-12-23 ENCOUNTER — Other Ambulatory Visit (HOSPITAL_COMMUNITY): Payer: Self-pay

## 2023-01-04 ENCOUNTER — Other Ambulatory Visit (HOSPITAL_BASED_OUTPATIENT_CLINIC_OR_DEPARTMENT_OTHER): Payer: Self-pay

## 2023-01-05 ENCOUNTER — Telehealth: Payer: Self-pay

## 2023-01-05 ENCOUNTER — Other Ambulatory Visit (HOSPITAL_BASED_OUTPATIENT_CLINIC_OR_DEPARTMENT_OTHER): Payer: Self-pay

## 2023-01-06 ENCOUNTER — Other Ambulatory Visit (HOSPITAL_BASED_OUTPATIENT_CLINIC_OR_DEPARTMENT_OTHER): Payer: Self-pay

## 2023-01-09 ENCOUNTER — Encounter: Payer: Self-pay | Admitting: Physician Assistant

## 2023-01-09 MED ORDER — FUSION PLUS PO CAPS
1.0000 | ORAL_CAPSULE | Freq: Every day | ORAL | 3 refills | Status: AC
  Filled 2023-01-11: qty 90, 90d supply, fill #0

## 2023-01-10 ENCOUNTER — Ambulatory Visit: Payer: 59 | Admitting: Sports Medicine

## 2023-01-10 ENCOUNTER — Other Ambulatory Visit (HOSPITAL_BASED_OUTPATIENT_CLINIC_OR_DEPARTMENT_OTHER): Payer: Self-pay

## 2023-01-10 DIAGNOSIS — M51369 Other intervertebral disc degeneration, lumbar region without mention of lumbar back pain or lower extremity pain: Secondary | ICD-10-CM | POA: Insufficient documentation

## 2023-01-10 DIAGNOSIS — M5136 Other intervertebral disc degeneration, lumbar region: Secondary | ICD-10-CM | POA: Diagnosis not present

## 2023-01-10 DIAGNOSIS — M1711 Unilateral primary osteoarthritis, right knee: Secondary | ICD-10-CM | POA: Diagnosis not present

## 2023-01-10 MED ORDER — PREDNISONE 50 MG PO TABS
ORAL_TABLET | ORAL | 0 refills | Status: DC
  Filled 2023-01-10: qty 5, 5d supply, fill #0

## 2023-01-10 NOTE — Assessment & Plan Note (Signed)
Pleasant 57 year old female, MRI confirmed osteoarthritis without other internal derangements, steroid injection done in October of last year continues to do well, we did get her approved for Euflexxa but she does not need it at this point.

## 2023-01-10 NOTE — Assessment & Plan Note (Signed)
Several days of increasing right-sided low back pain localized over the quadratus lumborum without red flag symptoms or radiculitis. Worse with sitting, flexion, Valsalva. Adding 5 days of steroids, x-rays, formal PT, return to see me in 6 weeks, MRI for interventional planning if not better.

## 2023-01-10 NOTE — Progress Notes (Signed)
    Procedures performed today:    None.  Independent interpretation of notes and tests performed by another provider:   None.  Brief History, Exam, Impression, and Recommendations:    Primary osteoarthritis of right knee Pleasant 57 year old female, MRI confirmed osteoarthritis without other internal derangements, steroid injection done in October of last year continues to do well, we did get her approved for Euflexxa but she does not need it at this point.  Lumbar degenerative disc disease Several days of increasing right-sided low back pain localized over the quadratus lumborum without red flag symptoms or radiculitis. Worse with sitting, flexion, Valsalva. Adding 5 days of steroids, x-rays, formal PT, return to see me in 6 weeks, MRI for interventional planning if not better.    ____________________________________________ Ihor Austin. Benjamin Stain, M.D., ABFM., CAQSM., AME. Primary Care and Sports Medicine [place] [facility]  Adjunct Professor of Family Medicine  [place] of [facility] [employer]  Restaurant manager, fast food

## 2023-01-11 ENCOUNTER — Other Ambulatory Visit (HOSPITAL_BASED_OUTPATIENT_CLINIC_OR_DEPARTMENT_OTHER): Payer: Self-pay

## 2023-01-12 ENCOUNTER — Other Ambulatory Visit (HOSPITAL_BASED_OUTPATIENT_CLINIC_OR_DEPARTMENT_OTHER): Payer: Self-pay

## 2023-01-12 NOTE — Telephone Encounter (Signed)
Initiated Prior authorization [id number] 7.5MG /0.5ML pen-injectors Via: Covermymeds Case/Key:[id number] Status: approved as of 01/05/23 Reason:The request has been approved. The authorization is effective from 01/05/2023 to 01/05/2024, as long as the member is enrolled in their current health plan. This has been approved for a max daily dosage of 0.07. A written notification letter will follow with additional details. Notified Pt via: Mychart  Please note the pt did not update the pharmacy on her new insurance card

## 2023-01-25 ENCOUNTER — Other Ambulatory Visit (HOSPITAL_BASED_OUTPATIENT_CLINIC_OR_DEPARTMENT_OTHER): Payer: Self-pay

## 2023-01-30 ENCOUNTER — Other Ambulatory Visit (HOSPITAL_BASED_OUTPATIENT_CLINIC_OR_DEPARTMENT_OTHER): Payer: Self-pay

## 2023-02-20 ENCOUNTER — Encounter: Payer: Self-pay | Admitting: Physician Assistant

## 2023-02-20 ENCOUNTER — Other Ambulatory Visit (HOSPITAL_BASED_OUTPATIENT_CLINIC_OR_DEPARTMENT_OTHER): Payer: Self-pay

## 2023-02-20 ENCOUNTER — Other Ambulatory Visit: Payer: Self-pay | Admitting: Physician Assistant

## 2023-02-20 DIAGNOSIS — M329 Systemic lupus erythematosus, unspecified: Secondary | ICD-10-CM

## 2023-02-21 ENCOUNTER — Other Ambulatory Visit: Payer: Self-pay

## 2023-02-21 ENCOUNTER — Other Ambulatory Visit (HOSPITAL_BASED_OUTPATIENT_CLINIC_OR_DEPARTMENT_OTHER): Payer: Self-pay

## 2023-02-21 MED ORDER — ALBUTEROL SULFATE HFA 108 (90 BASE) MCG/ACT IN AERS
2.0000 | INHALATION_SPRAY | Freq: Four times a day (QID) | RESPIRATORY_TRACT | 1 refills | Status: DC | PRN
  Filled 2023-02-21: qty 6.7, 25d supply, fill #0
  Filled 2023-04-17: qty 6.7, 25d supply, fill #1

## 2023-02-23 ENCOUNTER — Encounter: Payer: Self-pay | Admitting: Sports Medicine

## 2023-03-03 ENCOUNTER — Ambulatory Visit: Payer: 59 | Admitting: Sports Medicine

## 2023-03-10 ENCOUNTER — Ambulatory Visit: Payer: 59 | Admitting: Sports Medicine

## 2023-03-10 ENCOUNTER — Other Ambulatory Visit (INDEPENDENT_AMBULATORY_CARE_PROVIDER_SITE_OTHER): Payer: 59

## 2023-03-10 DIAGNOSIS — M1711 Unilateral primary osteoarthritis, right knee: Secondary | ICD-10-CM

## 2023-03-10 LAB — BASIC METABOLIC PANEL
BUN/Creatinine Ratio: 17 (ref 9–23)
BUN: 15 mg/dL (ref 6–24)
CO2: 22 mmol/L (ref 20–29)
Calcium: 9.4 mg/dL (ref 8.7–10.2)
Chloride: 106 mmol/L (ref 96–106)
Creatinine, Ser: 0.89 mg/dL (ref 0.57–1.00)
Glucose: 92 mg/dL (ref 70–99)
Potassium: 3.7 mmol/L (ref 3.5–5.2)
Sodium: 142 mmol/L (ref 134–144)
eGFR: 77 mL/min/{1.73_m2} (ref >59)

## 2023-03-10 NOTE — Progress Notes (Signed)
    Procedures performed today:    Procedure: Real-time Ultrasound Guided injection of the right knee Device: Samsung HS60  Verbal informed consent obtained.  Time-out conducted.  Noted no overlying erythema, induration, or other signs of local infection.  Skin prepped in a sterile fashion.  Local anesthesia: Topical Ethyl chloride.  With sterile technique and under real time ultrasound guidance: 1 cc Kenalog 40, 2 cc lidocaine, 2 cc bupivacaine injected easily, syringe switched and 1 syringe of Euflexxa injected easily. Completed without difficulty  Advised to call if fevers/chills, erythema, induration, drainage, or persistent bleeding.  Images permanently stored and available for review in PACS.  Impression: Technically successful ultrasound guided injection.  Independent interpretation of notes and tests performed by another provider:   None.  Brief History, Exam, Impression, and Recommendations:    Primary osteoarthritis of right knee This is a pleasant 57 year old female, MRI confirmed osteoarthritis without other derangements, steroid injection last done in October, we did get her approved for Euflexxa but she did not need it initially. Now having recurrence of discomfort, repeat steroid injection today with Euflexxa, return in 1 week for Euflexxa No. 2 of 3.    ____________________________________________ Ihor Austin. Benjamin Stain, M.D., ABFM., CAQSM., AME. Primary Care and Sports Medicine [place] [facility]  Adjunct Professor of Family Medicine  [place] of [facility] [employer]  Restaurant manager, fast food

## 2023-03-10 NOTE — Assessment & Plan Note (Signed)
This is a pleasant 57 year old female, MRI confirmed osteoarthritis without other derangements, steroid injection last done in October, we did get her approved for Euflexxa but she did not need it initially. Now having recurrence of discomfort, repeat steroid injection today with Euflexxa, return in 1 week for Euflexxa No. 2 of 3.

## 2023-03-10 NOTE — Progress Notes (Signed)
BMP looks good.  Kidney function stable.

## 2023-03-20 ENCOUNTER — Other Ambulatory Visit: Payer: Self-pay | Admitting: Physician Assistant

## 2023-03-20 DIAGNOSIS — E785 Hyperlipidemia, unspecified: Secondary | ICD-10-CM

## 2023-03-20 DIAGNOSIS — I10 Essential (primary) hypertension: Secondary | ICD-10-CM

## 2023-03-20 DIAGNOSIS — Z9884 Bariatric surgery status: Secondary | ICD-10-CM

## 2023-03-20 DIAGNOSIS — R7303 Prediabetes: Secondary | ICD-10-CM

## 2023-03-20 DIAGNOSIS — Z6832 Body mass index (BMI) 32.0-32.9, adult: Secondary | ICD-10-CM

## 2023-03-21 ENCOUNTER — Encounter: Payer: Self-pay | Admitting: Physician Assistant

## 2023-03-21 DIAGNOSIS — M549 Dorsalgia, unspecified: Secondary | ICD-10-CM

## 2023-03-22 ENCOUNTER — Other Ambulatory Visit (HOSPITAL_BASED_OUTPATIENT_CLINIC_OR_DEPARTMENT_OTHER): Payer: Self-pay

## 2023-03-22 MED ORDER — MOUNJARO 10 MG/0.5ML ~~LOC~~ SOAJ
10.0000 mg | SUBCUTANEOUS | 0 refills | Status: DC
  Filled 2023-03-22: qty 2, 28d supply, fill #0
  Filled 2023-04-11 – 2023-04-17 (×2): qty 2, 28d supply, fill #1

## 2023-03-24 ENCOUNTER — Other Ambulatory Visit (INDEPENDENT_AMBULATORY_CARE_PROVIDER_SITE_OTHER): Payer: 59

## 2023-03-24 ENCOUNTER — Other Ambulatory Visit (HOSPITAL_BASED_OUTPATIENT_CLINIC_OR_DEPARTMENT_OTHER): Payer: Self-pay

## 2023-03-24 ENCOUNTER — Ambulatory Visit: Payer: 59 | Admitting: Sports Medicine

## 2023-03-24 DIAGNOSIS — M1711 Unilateral primary osteoarthritis, right knee: Secondary | ICD-10-CM

## 2023-03-24 MED ORDER — MELOXICAM 15 MG PO TABS
15.0000 mg | ORAL_TABLET | Freq: Every day | ORAL | 0 refills | Status: DC
  Filled 2023-03-24 – 2023-04-17 (×2): qty 90, 90d supply, fill #0

## 2023-03-24 MED ORDER — CYCLOBENZAPRINE HCL 10 MG PO TABS
10.0000 mg | ORAL_TABLET | Freq: Three times a day (TID) | ORAL | 0 refills | Status: DC | PRN
  Filled 2023-03-24 – 2023-03-29 (×2): qty 30, 10d supply, fill #0

## 2023-03-24 NOTE — Addendum Note (Signed)
Addended by: Jomarie Longs on: 03/24/2023 12:19 PM   Modules accepted: Orders

## 2023-03-24 NOTE — Progress Notes (Signed)
    Procedures performed today:    Procedure: Real-time Ultrasound Guided injection of the right knee Device: Samsung HS60  Verbal informed consent obtained.  Time-out conducted.  Noted no overlying erythema, induration, or other signs of local infection.  Skin prepped in a sterile fashion.  Local anesthesia: Topical Ethyl chloride.  With sterile technique and under real time ultrasound guidance: No effusion noted, I advanced a 22-gauge needle into the suprapatellar recess and injected 1 syringe of Euflexxa.  Completed without difficulty  Advised to call if fevers/chills, erythema, induration, drainage, or persistent bleeding.  Images permanently stored and available for review in PACS.  Impression: Technically successful ultrasound guided injection.  Independent interpretation of notes and tests performed by another provider:   None.  Brief History, Exam, Impression, and Recommendations:    Primary osteoarthritis of right knee Euflexxa No. 2 of 3 right knee, return in 1 week for #3 of 3.    ____________________________________________ Ihor Austin. Benjamin Stain, M.D., ABFM., CAQSM., AME. Primary Care and Sports Medicine  [facility]  Adjunct Professor of Family Medicine  [place] of [facility] [employer]  Restaurant manager, fast food

## 2023-03-24 NOTE — Assessment & Plan Note (Signed)
Euflexxa No. 2 of 3 right knee, return in 1 week for #3 of 3.

## 2023-03-29 ENCOUNTER — Other Ambulatory Visit (HOSPITAL_BASED_OUTPATIENT_CLINIC_OR_DEPARTMENT_OTHER): Payer: Self-pay

## 2023-03-31 ENCOUNTER — Other Ambulatory Visit (INDEPENDENT_AMBULATORY_CARE_PROVIDER_SITE_OTHER): Payer: 59

## 2023-03-31 ENCOUNTER — Ambulatory Visit (INDEPENDENT_AMBULATORY_CARE_PROVIDER_SITE_OTHER): Payer: 59 | Admitting: Sports Medicine

## 2023-03-31 DIAGNOSIS — M1711 Unilateral primary osteoarthritis, right knee: Secondary | ICD-10-CM

## 2023-03-31 NOTE — Progress Notes (Signed)
    Procedures performed today:    Procedure: Real-time Ultrasound Guided injection of the right knee Device: Samsung HS60  Verbal informed consent obtained.  Time-out conducted.  Noted no overlying erythema, induration, or other signs of local infection.  Skin prepped in a sterile fashion.  Local anesthesia: Topical Ethyl chloride.  With sterile technique and under real time ultrasound guidance: No effusion noted, I advanced a 22-gauge needle into the suprapatellar recess and injected 1 syringe of Euflexxa.  Completed without difficulty  Advised to call if fevers/chills, erythema, induration, drainage, or persistent bleeding.  Images permanently stored and available for review in PACS.  Impression: Technically successful ultrasound guided injection.  Independent interpretation of notes and tests performed by another provider:   None.  Brief History, Exam, Impression, and Recommendations:    Primary osteoarthritis of right knee Euflexxa 3 of 3 right knee, return to see me in 6 weeks as needed. Of note we did discuss genicular artery embolization as a potential next step should she not respond to viscosupplementation.    ____________________________________________ Ihor Austin. Benjamin Stain, M.D., ABFM., CAQSM., AME. Primary Care and Sports Medicine [place] [facility]  Adjunct Professor of Family Medicine  [place] of [facility] [employer]  Restaurant manager, fast food

## 2023-03-31 NOTE — Assessment & Plan Note (Addendum)
Euflexxa 3 of 3 right knee, return to see me in 6 weeks as needed. Of note we did discuss genicular artery embolization as a potential next step should she not respond to viscosupplementation.

## 2023-04-03 DIAGNOSIS — Z419 Encounter for procedure for purposes other than remedying health state, unspecified: Secondary | ICD-10-CM | POA: Diagnosis not present

## 2023-04-12 ENCOUNTER — Other Ambulatory Visit (HOSPITAL_COMMUNITY): Payer: Self-pay

## 2023-04-14 ENCOUNTER — Telehealth: Payer: Self-pay

## 2023-04-14 ENCOUNTER — Encounter: Payer: Self-pay | Admitting: Physician Assistant

## 2023-04-14 NOTE — Telephone Encounter (Signed)
Initiated Prior authorization [id number] 10MG /0.5ML pen-injectors Via: Covermymeds Case/Key:[id number]  Status: Pending as of 04/14/23 Reason: Notified Pt via: Mychart

## 2023-04-17 ENCOUNTER — Other Ambulatory Visit (HOSPITAL_BASED_OUTPATIENT_CLINIC_OR_DEPARTMENT_OTHER): Payer: Self-pay

## 2023-04-17 ENCOUNTER — Other Ambulatory Visit: Payer: Self-pay | Admitting: Physician Assistant

## 2023-04-17 ENCOUNTER — Other Ambulatory Visit: Payer: Self-pay

## 2023-04-17 ENCOUNTER — Other Ambulatory Visit (HOSPITAL_COMMUNITY): Payer: Self-pay

## 2023-04-17 DIAGNOSIS — M329 Systemic lupus erythematosus, unspecified: Secondary | ICD-10-CM

## 2023-04-17 MED ORDER — ALBUTEROL SULFATE HFA 108 (90 BASE) MCG/ACT IN AERS
2.0000 | INHALATION_SPRAY | Freq: Four times a day (QID) | RESPIRATORY_TRACT | 1 refills | Status: AC | PRN
  Filled 2023-04-17: qty 6.7, 25d supply, fill #0

## 2023-04-17 MED ORDER — HYDROXYCHLOROQUINE SULFATE 200 MG PO TABS
200.0000 mg | ORAL_TABLET | Freq: Two times a day (BID) | ORAL | 1 refills | Status: DC
  Filled 2023-04-17: qty 180, 90d supply, fill #0

## 2023-04-17 MED ORDER — CYCLOBENZAPRINE HCL 10 MG PO TABS
10.0000 mg | ORAL_TABLET | Freq: Three times a day (TID) | ORAL | 0 refills | Status: DC | PRN
  Filled 2023-04-17: qty 30, 10d supply, fill #0

## 2023-04-25 ENCOUNTER — Other Ambulatory Visit (HOSPITAL_COMMUNITY): Payer: Self-pay

## 2023-04-25 ENCOUNTER — Other Ambulatory Visit (HOSPITAL_BASED_OUTPATIENT_CLINIC_OR_DEPARTMENT_OTHER): Payer: Self-pay

## 2023-05-04 DIAGNOSIS — Z419 Encounter for procedure for purposes other than remedying health state, unspecified: Secondary | ICD-10-CM | POA: Diagnosis not present

## 2023-05-25 ENCOUNTER — Encounter: Payer: Self-pay | Admitting: Physician Assistant

## 2023-05-31 ENCOUNTER — Encounter: Payer: Self-pay | Admitting: Physician Assistant

## 2023-06-04 DIAGNOSIS — Z419 Encounter for procedure for purposes other than remedying health state, unspecified: Secondary | ICD-10-CM | POA: Diagnosis not present

## 2023-06-14 ENCOUNTER — Encounter: Payer: Self-pay | Admitting: Physician Assistant

## 2023-06-14 ENCOUNTER — Telehealth (INDEPENDENT_AMBULATORY_CARE_PROVIDER_SITE_OTHER): Payer: No Typology Code available for payment source | Admitting: Physician Assistant

## 2023-06-14 VITALS — BP 136/82 | Wt 169.0 lb

## 2023-06-14 DIAGNOSIS — R7303 Prediabetes: Secondary | ICD-10-CM | POA: Diagnosis not present

## 2023-06-14 DIAGNOSIS — E782 Mixed hyperlipidemia: Secondary | ICD-10-CM

## 2023-06-14 DIAGNOSIS — R7611 Nonspecific reaction to tuberculin skin test without active tuberculosis: Secondary | ICD-10-CM | POA: Insufficient documentation

## 2023-06-14 DIAGNOSIS — Z9884 Bariatric surgery status: Secondary | ICD-10-CM | POA: Diagnosis not present

## 2023-06-14 DIAGNOSIS — E663 Overweight: Secondary | ICD-10-CM | POA: Diagnosis not present

## 2023-06-14 DIAGNOSIS — I10 Essential (primary) hypertension: Secondary | ICD-10-CM | POA: Diagnosis not present

## 2023-06-14 MED ORDER — MOUNJARO 10 MG/0.5ML ~~LOC~~ SOAJ: 10.0000 mg | SUBCUTANEOUS | 0 refills | Status: DC

## 2023-06-14 MED ORDER — LISINOPRIL-HYDROCHLOROTHIAZIDE 10-12.5 MG PO TABS: 1.0000 | ORAL_TABLET | Freq: Every day | ORAL | 1 refills | Status: DC

## 2023-06-14 NOTE — Progress Notes (Signed)
..Virtual Visit via Video Note  I connected with Michelle Huffman on 06/14/23 at 11:10 AM EDT by a video enabled telemedicine application and verified that I am speaking with the correct person using two identifiers.  Location: Patient: home Provider: clinic  .Marland KitchenParticipating in visit:  Patient: Michelle Huffman Provider: Tandy Gaw PA-C Provider in training: Weston Anna PA-S   I discussed the limitations of evaluation and management by telemedicine and the availability of in person appointments. The patient expressed understanding and agreed to proceed.  History of Present Illness: Pt is a 57 yo female who calls into the clinic for refills and to discuss positive TB screen.   Pt is doing well with weight loss. She is on 10mg  weekly mounjaro and lost almost 40lbs. She is exercising more. She is eating less. She has some constipation but treated with OTC remedies.   Her BP is running around 130/80-140/80. No CP, palpitations, headaches or vision changes. Taking BP medication.   She has a new job to start in sept but has a positive skin TB screen. She has had lab drawn but not resulted. She denies any recent travel, cough, sweats, fever.   .. Active Ambulatory Problems    Diagnosis Date Noted   PITUITARY ADENOMA, BENIGN 07/25/2007   HYPOTHYROIDISM 05/03/2007   ANEMIA-IRON DEFICIENCY 05/03/2007   PAROTIDITIS 10/11/2007   Biliary dyskinesia 07/11/2011   Right shoulder pain 03/06/2014   Morbid obesity (HCC) 03/27/2014   Insomnia 11/17/2015   GAD (generalized anxiety disorder) 11/17/2015   Severe episode of recurrent major depressive disorder, without psychotic features (HCC) 11/17/2015   Systemic lupus erythematosus (HCC) 07/20/2022   Plantar fascial fibromatosis of right foot 07/20/2022   Primary osteoarthritis of right knee 07/20/2022   Class 2 obesity due to excess calories without serious comorbidity with body mass index (BMI) of 37.0 to 37.9 in adult 08/16/2022   S/P  laparoscopic sleeve gastrectomy 08/16/2022   Primary hypertension 08/16/2022   Alpha thalassemia trait 03/31/2015   ANA positive 10/17/2013   Asthma 10/17/2013   Elevated sed rate 10/17/2013   Iron deficiency 01/14/2016   Mixed hyperlipidemia 03/31/2021   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   Recurrent bronchospasm 07/23/2013   Rheumatoid arthritis (HCC) 10/29/2013   Microcytic anemia 08/16/2022   Elevated LDL cholesterol level 08/17/2022   Pre-diabetes 08/17/2022   Mid back pain 09/13/2022   DDD (degenerative disc disease), thoracic 09/14/2022   Lumbar degenerative disc disease 01/10/2023   Positive TB test 06/14/2023   Overweight (BMI 25.0-29.9) 06/14/2023   Resolved Ambulatory Problems    Diagnosis Date Noted   No Resolved Ambulatory Problems   Past Medical History:  Diagnosis Date   Alpha thalassemia major (HCC)    Anemia    Arthritis    Autoimmune disease (HCC)    Blood transfusion    Chest pain    Complication of anesthesia    Hypertension    Hypothyroidism    Lupus (HCC)    PONV (postoperative nausea and vomiting)        Observations/Objective: No acute distress Normal mood and appearance No cough  No abnormal breathing  .[name] Today's Vitals   06/14/23 1114  Weight: 169 lb (76.7 kg)   Body mass index is 29.01 kg/m.     Assessment and Plan: Marland KitchenMarland KitchenDiagnoses and all orders for this visit:  Overweight (BMI 25.0-29.9) -     tirzepatide (MOUNJARO) 10 MG/0.5ML Pen; Inject 10 mg into the skin once a week.  Pre-diabetes -     tirzepatide [facility])  10 MG/0.5ML Pen; Inject 10 mg into the skin once a week.  Primary hypertension -     tirzepatide (MOUNJARO) 10 MG/0.5ML Pen; Inject 10 mg into the skin once a week. -     lisinopril-hydrochlorothiazide (ZESTORETIC) 10-12.5 MG tablet; Take 1 tablet by mouth daily.  S/P laparoscopic sleeve gastrectomy -     tirzepatide (MOUNJARO) 10 MG/0.5ML Pen; Inject 10 mg into the skin once a week.  Positive TB test  Mixed  hyperlipidemia   Refilled mounjaro and zestoretic Continue to work on healthy diet and regular exercise Let me know results of blood TB screen, if positive suggest CXR Will get flu shot at work Follow up in 6 months   Follow Up Instructions:    I discussed the assessment and treatment plan with the patient. The patient was provided an opportunity to ask questions and all were answered. The patient agreed with the plan and demonstrated an understanding of the instructions.   The patient was advised to call back or seek an in-person evaluation if the symptoms worsen or if the condition fails to improve as anticipated.   Tandy Gaw, PA-C

## 2023-06-15 ENCOUNTER — Encounter: Payer: Self-pay | Admitting: Physician Assistant

## 2023-06-15 DIAGNOSIS — R7611 Nonspecific reaction to tuberculin skin test without active tuberculosis: Secondary | ICD-10-CM

## 2023-06-16 ENCOUNTER — Ambulatory Visit (INDEPENDENT_AMBULATORY_CARE_PROVIDER_SITE_OTHER): Payer: No Typology Code available for payment source

## 2023-06-16 ENCOUNTER — Other Ambulatory Visit: Payer: Self-pay | Admitting: Physician Assistant

## 2023-06-16 DIAGNOSIS — R7611 Nonspecific reaction to tuberculin skin test without active tuberculosis: Secondary | ICD-10-CM | POA: Diagnosis not present

## 2023-06-16 NOTE — Progress Notes (Signed)
Pt comes into office asking about results of her CXR. The read is not back yet but does not appear to be anything concerning. Discussed with patient and told her will call with official read results.

## 2023-06-16 NOTE — Progress Notes (Signed)
Official read also showed normal CXR!

## 2023-06-20 ENCOUNTER — Ambulatory Visit: Payer: Medicaid Other | Admitting: Sports Medicine

## 2023-06-22 ENCOUNTER — Encounter: Payer: Self-pay | Admitting: Sports Medicine

## 2023-06-22 ENCOUNTER — Ambulatory Visit (INDEPENDENT_AMBULATORY_CARE_PROVIDER_SITE_OTHER): Payer: No Typology Code available for payment source | Admitting: Sports Medicine

## 2023-06-22 DIAGNOSIS — M1711 Unilateral primary osteoarthritis, right knee: Secondary | ICD-10-CM | POA: Diagnosis not present

## 2023-06-22 NOTE — Patient Instructions (Signed)
Look into geniculate artery embolization.

## 2023-06-22 NOTE — Assessment & Plan Note (Signed)
Pleasant 57 year old female, end-stage knee osteoarthritis, she has had steroid injections, analgesics, viscosupplementation without improvement, I would like her to do a consult for geniculate artery embolization though she is not yet ready to proceed with this or knee replacement.

## 2023-06-22 NOTE — Progress Notes (Signed)
    Procedures performed today:    None.  Independent interpretation of notes and tests performed by another provider:   None.  Brief History, Exam, Impression, and Recommendations:    Primary osteoarthritis of right knee Pleasant 57 year old female, end-stage knee osteoarthritis, she has had steroid injections, analgesics, viscosupplementation without improvement, I would like her to do a consult for geniculate artery embolization though she is not yet ready to proceed with this or knee replacement.    ____________________________________________ Ihor Austin. Benjamin Stain, M.D., ABFM., CAQSM., AME. Primary Care and Sports Medicine [facility]  Adjunct Professor of Family Medicine  [place] [employer] [facility] [employer]  Restaurant manager, fast food

## 2023-06-24 DIAGNOSIS — Z111 Encounter for screening for respiratory tuberculosis: Secondary | ICD-10-CM | POA: Diagnosis not present

## 2023-06-25 ENCOUNTER — Other Ambulatory Visit: Payer: Self-pay | Admitting: Physician Assistant

## 2023-06-25 DIAGNOSIS — Z9884 Bariatric surgery status: Secondary | ICD-10-CM

## 2023-06-25 DIAGNOSIS — I10 Essential (primary) hypertension: Secondary | ICD-10-CM

## 2023-06-25 DIAGNOSIS — E663 Overweight: Secondary | ICD-10-CM

## 2023-06-25 DIAGNOSIS — R7303 Prediabetes: Secondary | ICD-10-CM

## 2023-06-27 NOTE — Progress Notes (Signed)
Chief Complaint: Patient was seen in virtual consultation today for right knee pain  Referring Physician(s): Thekkekandam,Thomas J  History of Present Illness: Michelle Huffman is a 57 y.o. female has a medical history significant for lupus, alpha thalassemia major, HTN and right knee osteoarthritis. She has been treated conservatively with steroid injections, analgesics and viscosupplementation without meaningful improvement.  She is not interested in pursuing surgical intervention. She is followed by Dr. Benjamin Stain who has graciously referred this patient to our team to discuss genicular artery embolization.  WOMAC Pain Score:  VAS Pain Score:  Past Medical History:  Diagnosis Date   Alpha thalassemia major (HCC)    Anemia    Arthritis    Asthma    Autoimmune disease (HCC)    Blood transfusion    Chest pain    Complication of anesthesia    Hypertension    Hypothyroidism    Lupus (HCC)    PONV (postoperative nausea and vomiting)     Past Surgical History:  Procedure Laterality Date   ABDOMINAL HYSTERECTOMY  2009   CHOLECYSTECTOMY  07/21/11   KNEE SURGERY  2010   laproscopic right knee   LAPAROSCOPIC GASTRIC SLEEVE RESECTION N/A 08/19/2014   Procedure: LAPAROSCOPIC GASTRIC SLEEVE RESECTION;  Surgeon: Glenna Fellows, MD;  Location: [facility];  Service: General;  Laterality: N/A;   UPPER GI ENDOSCOPY  08/19/2014   Procedure: UPPER GI ENDOSCOPY;  Surgeon: Glenna Fellows, MD;  Location: [facility];  Service: General;;    Allergies: Sulfa antibiotics  Medications: Prior to Admission medications   Medication Sig Start Date End Date Taking? Authorizing Provider  albuterol (VENTOLIN HFA) 108 (90 Base) MCG/ACT inhaler Inhale 2 puffs into the lungs every 6 (six) hours as needed for shortness of breath. 04/17/23   Breeback, Lonna Cobb, PA-C  cyclobenzaprine (FLEXERIL) 10 MG tablet Take 1 tablet (10 mg total) by mouth 3 (three) times daily as needed for muscle spasms. 09/13/22    Breeback, Lonna Cobb, PA-C  cyclobenzaprine (FLEXERIL) 10 MG tablet Take 1 tablet (10 mg total) by mouth 3 (three) times daily as needed for muscle spasms. 04/17/23   Breeback, Lonna Cobb, PA-C  hydroxychloroquine (PLAQUENIL) 200 MG tablet Take 1 tablet (200 mg total) by mouth 2 (two) times daily. 04/17/23   Breeback, Jade L, PA-C  Iron-FA-B Cmp-C-Biot-Probiotic (FUSION PLUS) CAPS Take 1 capsule by mouth daily. 01/09/23   Breeback, Lonna Cobb, PA-C  lisinopril-hydrochlorothiazide (ZESTORETIC) 10-12.5 MG tablet Take 1 tablet by mouth daily. 06/14/23   Breeback, Lonna Cobb, PA-C  meloxicam (MOBIC) 15 MG tablet Take 1 tablet (15 mg total) by mouth daily. 03/24/23   Breeback, Lesly Rubenstein L, PA-C  metoprolol tartrate (LOPRESSOR) 25 MG tablet Take 25 mg by mouth daily as needed.    [provider]  ondansetron (ZOFRAN) 4 MG tablet Take 1-2 tablets (4-8 mg total) by mouth every 8 (eight) hours as needed for nausea or vomiting. 11/18/18   Eustace Moore, MD  rosuvastatin (CRESTOR) 10 MG tablet Take 1 tablet (10 mg total) by mouth daily. 09/27/22   Breeback, Lonna Cobb, PA-C  tirzepatide [facility]) 10 MG/0.5ML Pen Inject 10 mg into the skin once a week. 06/14/23   Jomarie Longs, PA-C     Family History  Problem Relation Age of Onset   Depression Mother    Hypertension Father    Hyperlipidemia Father    Asthma Other    Hypertension Other    Hyperlipidemia Other    Alcohol abuse Neg Hx  Anxiety disorder Neg Hx    Bipolar disorder Neg Hx    Dementia Neg Hx    Drug abuse Neg Hx    Schizophrenia Neg Hx     Social History   Socioeconomic History   Marital status: Single    Spouse name: Not on file   Number of children: 1   Years of education: 43   Highest education level: Some college, no degree  Occupational History   Not on file  Tobacco Use   Smoking status: Former    Current packs/day: 0.00    Types: Cigarettes    Start date: 11/17/1999    Quit date: 11/17/2007    Years since quitting: 15.6    Smokeless tobacco: Never  Vaping Use   Vaping status: Never Used  Substance and Sexual Activity   Alcohol use: Yes    Comment: socially- a few times a year   Drug use: No   Sexual activity: Not on file  Other Topics Concern   Not on file  Social History Narrative   Pt lives in [place] with her 17yo daughter. Born and raised in [place] by her parents. Pt has 2 bro and pt is the youngest. States it was a good childhood. Pt has completed one year of college. Pt works at [employer] in [employer] and [employer] for last 3 yrs. Pt has never been married.    Social Determinants of Health   Financial Resource Strain: Medium Risk (03/09/2023)   Overall Financial Resource Strain (CARDIA)    Difficulty of Paying Living Expenses: Somewhat hard  Food Insecurity: Food Insecurity Present (03/09/2023)   Hunger Vital Sign    Worried About Running Out of Food in the Last Year: Never true    Ran Out of Food in the Last Year: Sometimes true  Transportation Needs: No Transportation Needs (03/09/2023)   PRAPARE - Administrator, Civil Service (Medical): No    Lack of Transportation (Non-Medical): No  Physical Activity: Insufficiently Active (03/09/2023)   Exercise Vital Sign    Days of Exercise per Week: 2 days    Minutes of Exercise per Session: 20 min  Stress: Stress Concern Present (03/09/2023)   [employer] of Occupational Health - Occupational Stress Questionnaire    Feeling of Stress : Rather much  Social Connections: Socially Isolated (03/09/2023)   Social Connection and Isolation Panel [NHANES]    Frequency of Communication with Friends and Family: Once a week    Frequency of Social Gatherings with Friends and Family: Once a week    Attends Religious Services: More than 4 times per year    Active Member of [employer] or Organizations: No    Attends Engineer, structural: Not on file    Marital Status: Never married     Review of Systems: A 12 point ROS discussed and pertinent  positives are indicated in the HPI above.  All other systems are negative.  Review of Systems  Vital Signs: There were no vitals taken for this visit.   No physical exam was performed in lieu of virtual telephone visit.   Imaging: DG Chest 2 View  Result Date: 06/16/2023 CLINICAL DATA:  Midsternal chest discomfort, history of lupus, positive PPD EXAM: CHEST - 2 VIEW COMPARISON:  11/21/2018 FINDINGS: Frontal and lateral views of the chest demonstrate an unremarkable cardiac silhouette. No acute airspace disease, effusion, or pneumothorax. No evidence of active tuberculosis. No acute bony abnormalities. IMPRESSION: 1. No acute intrathoracic process. Electronically  Signed   By: Sharlet Salina M.D.   On: 06/16/2023 15:26    Labs:  CBC: Recent Labs    11/09/22 0040  WBC 7.7  HGB 10.0*  HCT 32.9*  PLT 238    COAGS: No results for input(s): "INR", "APTT" in the last 8760 hours.  BMP: Recent Labs    08/16/22 0000 11/09/22 0040 03/09/23 1444  NA 142 141 142  K 4.1 3.3* 3.7  CL 107 104 106  CO2 26 24 22   GLUCOSE 88 104* 92  BUN 14 14 15   CALCIUM 9.4 10.5* 9.4  CREATININE 0.74 0.87 0.89  GFRNONAA  --  >60  --     LIVER FUNCTION TESTS: Recent Labs    08/16/22 0000 11/09/22 0040  BILITOT 0.3 0.4  AST 19 27  ALT 23 21  ALKPHOS  --  71  PROT 8.0 9.7*  ALBUMIN  --  4.9    TUMOR MARKERS: No results for input(s): "AFPTM", "CEA", "CA199", "CHROMGRNA" in the last 8760 hours.  Assessment and Plan:  57 year old female with a history significant for right knee pain.   Thank you for this interesting consult.  I greatly enjoyed meeting Michelle Huffman and look forward to participating in their care.  A copy of this report was sent to the requesting provider on this date.  Electronically Signed: Mickie Kay, NP 06/27/2023, 2:45 PM   I spent a total of 40 Minutes    in virtual telephone consultation, greater than 50% of which was counseling/coordinating care for  right knee pain.

## 2023-06-28 ENCOUNTER — Ambulatory Visit
Admission: RE | Admit: 2023-06-28 | Discharge: 2023-06-28 | Disposition: A | Discharge status: Home / Self Care | Payer: Medicaid Other | Source: Ambulatory Visit | Attending: Sports Medicine | Admitting: Sports Medicine

## 2023-06-28 DIAGNOSIS — M1711 Unilateral primary osteoarthritis, right knee: Secondary | ICD-10-CM

## 2023-06-28 HISTORY — PX: IR RADIOLOGIST EVAL & MGMT: IMG5224

## 2023-06-29 ENCOUNTER — Other Ambulatory Visit: Payer: Self-pay | Admitting: Interventional Radiology

## 2023-06-29 DIAGNOSIS — G8929 Other chronic pain: Secondary | ICD-10-CM

## 2023-06-29 DIAGNOSIS — M5136 Other intervertebral disc degeneration, lumbar region: Secondary | ICD-10-CM

## 2023-07-04 DIAGNOSIS — Z419 Encounter for procedure for purposes other than remedying health state, unspecified: Secondary | ICD-10-CM | POA: Diagnosis not present

## 2023-07-05 ENCOUNTER — Encounter: Payer: Self-pay | Admitting: Physician Assistant

## 2023-07-05 MED ORDER — TRAZODONE HCL 50 MG PO TABS
25.0000 mg | ORAL_TABLET | Freq: Every evening | ORAL | 2 refills | Status: DC | PRN
Start: 2023-07-05 — End: 2023-08-01

## 2023-07-05 NOTE — Addendum Note (Signed)
Addended byTandy Gaw L on: 07/05/2023 04:00 PM   Modules accepted: Orders

## 2023-07-14 ENCOUNTER — Encounter: Payer: Self-pay | Admitting: Physician Assistant

## 2023-07-17 MED ORDER — SERTRALINE HCL 25 MG PO TABS: 25.0000 mg | ORAL_TABLET | Freq: Every day | ORAL | 0 refills | Status: DC

## 2023-07-21 NOTE — Telephone Encounter (Signed)
Ty very much!

## 2023-07-21 NOTE — Telephone Encounter (Signed)
Can you help patient with a few counselors that take medicaid?

## 2023-07-29 ENCOUNTER — Other Ambulatory Visit: Payer: Self-pay | Admitting: Physician Assistant

## 2023-08-03 ENCOUNTER — Ambulatory Visit
Admission: EM | Admit: 2023-08-03 | Discharge: 2023-08-03 | Disposition: A | Discharge status: Home / Self Care | Payer: No Typology Code available for payment source | Attending: Internal Medicine | Admitting: Internal Medicine

## 2023-08-03 DIAGNOSIS — N23 Unspecified renal colic: Secondary | ICD-10-CM | POA: Insufficient documentation

## 2023-08-03 DIAGNOSIS — R109 Unspecified abdominal pain: Secondary | ICD-10-CM | POA: Diagnosis present

## 2023-08-03 DIAGNOSIS — Z9049 Acquired absence of other specified parts of digestive tract: Secondary | ICD-10-CM | POA: Insufficient documentation

## 2023-08-03 LAB — POCT URINALYSIS DIP (MANUAL ENTRY)
Bilirubin, UA: NEGATIVE
Glucose, UA: NEGATIVE mg/dL
Ketones, POC UA: NEGATIVE mg/dL
Leukocytes, UA: NEGATIVE
Nitrite, UA: NEGATIVE
Spec Grav, UA: >=1.03 — AB (ref 1.010–1.025)
Urobilinogen, UA: 0.2 U/dL
pH, UA: 5.5 (ref 5.0–8.0)

## 2023-08-03 MED ORDER — KETOROLAC TROMETHAMINE 30 MG/ML IJ SOLN
30.0000 mg | Freq: Once | INTRAMUSCULAR | Status: AC
  Administered 2023-08-03: 30 mg via INTRAMUSCULAR

## 2023-08-03 MED ORDER — TAMSULOSIN HCL 0.4 MG PO CAPS: 0.4000 mg | ORAL_CAPSULE | Freq: Every day | ORAL | 0 refills | Status: DC

## 2023-08-03 NOTE — Discharge Instructions (Addendum)
Please start tamsulosin to help promote urine flow and see if you do not pass a suspected kidney stone. Make sure you hydrate very well with plain water and a quantity of 80 ounces of water a day.  Please limit drinks that are considered urinary irritants such as soda, sweet tea, coffee, energy drinks, alcohol.  These can worsen your urinary and genital symptoms but also be the source of them.  I will let you know about your urine culture results through MyChart to see if we need to prescribe or change your antibiotics based off of those results.

## 2023-08-03 NOTE — ED Provider Notes (Signed)
[facility]  Note:  This document was prepared using Conservation officer, historic buildings and may include unintentional dictation errors.  MRN: [id number] DOB: 06-Jul-1966  Subjective:   Michelle Huffman is a 57 y.o. female presenting for 3 week history of acute onset persistent right flank pain.  No fall, trauma.  No fever, cough.  Has a history of a cholecystectomy.  Also has a history of degenerative disc disease of the thoracic and lumbar regions.  Has been using Tylenol and ibuprofen with some relief.  Does not hydrate very well at all with water.  No history of kidney stones.  No current facility-administered medications for this encounter.  Current Outpatient Medications:    albuterol (VENTOLIN HFA) 108 (90 Base) MCG/ACT inhaler, Inhale 2 puffs into the lungs every 6 (six) hours as needed for shortness of breath., Disp: 6.7 g, Rfl: 1   cyclobenzaprine (FLEXERIL) 10 MG tablet, Take 1 tablet (10 mg total) by mouth 3 (three) times daily as needed for muscle spasms., Disp: 30 tablet, Rfl: 0   cyclobenzaprine (FLEXERIL) 10 MG tablet, Take 1 tablet (10 mg total) by mouth 3 (three) times daily as needed for muscle spasms., Disp: 30 tablet, Rfl: 0   hydroxychloroquine (PLAQUENIL) 200 MG tablet, Take 1 tablet (200 mg total) by mouth 2 (two) times daily., Disp: 180 tablet, Rfl: 1   Iron-FA-B Cmp-C-Biot-Probiotic (FUSION PLUS) CAPS, Take 1 capsule by mouth daily., Disp: 90 capsule, Rfl: 3   lisinopril-hydrochlorothiazide (ZESTORETIC) 10-12.5 MG tablet, Take 1 tablet by mouth daily., Disp: 90 tablet, Rfl: 1   meloxicam (MOBIC) 15 MG tablet, Take 1 tablet (15 mg total) by mouth daily., Disp: 90 tablet, Rfl: 0   metoprolol tartrate (LOPRESSOR) 25 MG tablet, Take 25 mg by mouth daily as needed., Disp: , Rfl:    ondansetron (ZOFRAN) 4 MG tablet, Take 1-2 tablets (4-8 mg total) by mouth every 8 (eight) hours as needed for nausea or vomiting., Disp: 12 tablet, Rfl: 0   rosuvastatin  (CRESTOR) 10 MG tablet, Take 1 tablet (10 mg total) by mouth daily., Disp: 90 tablet, Rfl: 3   sertraline (ZOLOFT) 25 MG tablet, Take 1 tablet (25 mg total) by mouth daily., Disp: 30 tablet, Rfl: 0   tirzepatide (MOUNJARO) 10 MG/0.5ML Pen, Inject 10 mg into the skin once a week., Disp: 6 mL, Rfl: 0   traZODone (DESYREL) 50 MG tablet, TAKE 0.5-1 TABLETS BY MOUTH AT BEDTIME AS NEEDED FOR SLEEP., Disp: 90 tablet, Rfl: 1   Allergies  Allergen Reactions   Sulfa Antibiotics Shortness Of Breath and Swelling    Past Medical History:  Diagnosis Date   Alpha thalassemia major (HCC)    Anemia    Arthritis    Asthma    Autoimmune disease (HCC)    Blood transfusion    Chest pain    Complication of anesthesia    Hypertension    Hypothyroidism    Lupus    PONV (postoperative nausea and vomiting)      Past Surgical History:  Procedure Laterality Date   ABDOMINAL HYSTERECTOMY  2009   CHOLECYSTECTOMY  07/21/11   IR RADIOLOGIST EVAL & MGMT  06/28/2023   KNEE SURGERY  2010   laproscopic right knee   LAPAROSCOPIC GASTRIC SLEEVE RESECTION N/A 08/19/2014   Procedure: LAPAROSCOPIC GASTRIC SLEEVE RESECTION;  Surgeon: Glenna Fellows, MD;  Location: [facility];  Service: General;  Laterality: N/A;   UPPER GI ENDOSCOPY  08/19/2014   Procedure: UPPER GI ENDOSCOPY;  Surgeon: Glenna Fellows, MD;  Location: [facility];  Service: General;;    Family History  Problem Relation Age of Onset   Depression Mother    Hypertension Father    Hyperlipidemia Father    Asthma Other    Hypertension Other    Hyperlipidemia Other    Alcohol abuse Neg Hx    Anxiety disorder Neg Hx    Bipolar disorder Neg Hx    Dementia Neg Hx    Drug abuse Neg Hx    Schizophrenia Neg Hx     Social History   Tobacco Use   Smoking status: Former    Current packs/day: 0.00    Types: Cigarettes    Start date: 11/17/1999    Quit date: 11/17/2007    Years since quitting: 15.7   Smokeless tobacco: Never  Vaping Use   Vaping  status: Never Used  Substance Use Topics   Alcohol use: Yes    Comment: socially- a few times a year   Drug use: No    ROS   Objective:   Vitals: BP (!) 139/92 (BP Location: Right Arm)   Pulse 78   Temp 99.1 F (37.3 C) (Oral)   Resp 20   SpO2 99%   Physical Exam Constitutional:      General: She is not in acute distress.    Appearance: Normal appearance. She is well-developed. She is not ill-appearing, toxic-appearing or diaphoretic.  HENT:     Head: Normocephalic and atraumatic.     Nose: Nose normal.     Mouth/Throat:     Mouth: Mucous membranes are moist.  Eyes:     General: No scleral icterus.       Right eye: No discharge.        Left eye: No discharge.     Extraocular Movements: Extraocular movements intact.     Conjunctiva/sclera: Conjunctivae normal.  Cardiovascular:     Rate and Rhythm: Normal rate.  Pulmonary:     Effort: Pulmonary effort is normal.  Abdominal:     General: Bowel sounds are normal. There is no distension.     Palpations: Abdomen is soft. There is no mass.     Tenderness: There is no abdominal tenderness. There is no right CVA tenderness, left CVA tenderness, guarding or rebound.  Skin:    General: Skin is warm and dry.  Neurological:     General: No focal deficit present.     Mental Status: She is alert and oriented to person, place, and time.  Psychiatric:        Mood and Affect: Mood normal.        Behavior: Behavior normal.        Thought Content: Thought content normal.        Judgment: Judgment normal.     Results for orders placed or performed during the hospital encounter of 08/03/23 (from the past 24 hour(s))  POCT urinalysis dipstick     Status: Abnormal   Collection Time: 08/03/23  6:12 PM  Result Value Ref Range   Color, UA yellow yellow   Clarity, UA clear clear   Glucose, UA negative negative mg/dL   Bilirubin, UA negative negative   Ketones, POC UA negative negative mg/dL   Spec Grav, UA >=9.147 (A) 1.010 -  1.025   Blood, UA trace-intact (A) negative   pH, UA 5.5 5.0 - 8.0   Protein Ur, POC trace (A) negative mg/dL   Urobilinogen, UA 0.2 0.2 or 1.0 E.U./dL  Nitrite, UA Negative Negative   Leukocytes, UA Negative Negative   IM Toradol 30 mg administered in clinic.  Assessment and Plan :   PDMP not reviewed this encounter.  1. Renal colic on right side   2. Right flank pain    Urine culture pending, will manage for renal colic with tamsulosin, consistent hydration.  Counseled patient on potential for adverse effects with medications prescribed/recommended today, ER and return-to-clinic precautions discussed, patient verbalized understanding.    Wallis Bamberg, [place] 08/03/23 [id number]

## 2023-08-03 NOTE — ED Triage Notes (Signed)
Pt c/o right flank pain x 3 weeks-pain worse with movement-denies injury-taking tylenol and ibuprofen with some relief-NAD-steady gait

## 2023-08-04 DIAGNOSIS — Z419 Encounter for procedure for purposes other than remedying health state, unspecified: Secondary | ICD-10-CM | POA: Diagnosis not present

## 2023-08-04 LAB — URINE CULTURE: Culture: NO GROWTH

## 2023-08-12 ENCOUNTER — Other Ambulatory Visit: Payer: Medicaid Other

## 2023-08-14 ENCOUNTER — Encounter: Payer: Self-pay | Admitting: Physician Assistant

## 2023-08-14 ENCOUNTER — Other Ambulatory Visit (HOSPITAL_BASED_OUTPATIENT_CLINIC_OR_DEPARTMENT_OTHER): Payer: Self-pay

## 2023-08-20 ENCOUNTER — Ambulatory Visit
Admission: RE | Admit: 2023-08-20 | Discharge: 2023-08-20 | Disposition: A | Discharge status: Home / Self Care | Payer: Medicaid Other | Source: Ambulatory Visit | Attending: Interventional Radiology | Admitting: Interventional Radiology

## 2023-08-20 DIAGNOSIS — G8929 Other chronic pain: Secondary | ICD-10-CM

## 2023-08-20 MED ORDER — GADOPICLENOL 0.5 MMOL/ML IV SOLN
7.5000 mL | Freq: Once | INTRAVENOUS | Status: AC | PRN
  Administered 2023-08-20: 7.5 mL via INTRAVENOUS

## 2023-09-03 ENCOUNTER — Other Ambulatory Visit: Payer: Self-pay | Admitting: Physician Assistant

## 2023-09-03 DIAGNOSIS — Z9884 Bariatric surgery status: Secondary | ICD-10-CM

## 2023-09-03 DIAGNOSIS — E663 Overweight: Secondary | ICD-10-CM

## 2023-09-03 DIAGNOSIS — R7303 Prediabetes: Secondary | ICD-10-CM

## 2023-09-03 DIAGNOSIS — Z419 Encounter for procedure for purposes other than remedying health state, unspecified: Secondary | ICD-10-CM | POA: Diagnosis not present

## 2023-09-03 DIAGNOSIS — I10 Essential (primary) hypertension: Secondary | ICD-10-CM

## 2023-09-05 MED ORDER — MOUNJARO 10 MG/0.5ML ~~LOC~~ SOAJ: 10.0000 mg | SUBCUTANEOUS | 0 refills | Status: DC

## 2023-09-10 ENCOUNTER — Other Ambulatory Visit: Payer: Self-pay | Admitting: Physician Assistant

## 2023-09-10 DIAGNOSIS — E663 Overweight: Secondary | ICD-10-CM

## 2023-09-10 DIAGNOSIS — R7303 Prediabetes: Secondary | ICD-10-CM

## 2023-09-10 DIAGNOSIS — Z9884 Bariatric surgery status: Secondary | ICD-10-CM

## 2023-09-10 DIAGNOSIS — I10 Essential (primary) hypertension: Secondary | ICD-10-CM

## 2023-09-14 ENCOUNTER — Other Ambulatory Visit: Payer: Self-pay | Admitting: Physician Assistant

## 2023-09-19 ENCOUNTER — Other Ambulatory Visit: Payer: Self-pay | Admitting: Interventional Radiology

## 2023-09-19 DIAGNOSIS — G8929 Other chronic pain: Secondary | ICD-10-CM

## 2023-09-21 ENCOUNTER — Encounter: Payer: Self-pay | Admitting: Physician Assistant

## 2023-09-21 NOTE — Progress Notes (Signed)
Reason for follow up: Patient was seen in virtual consultation today for right knee pain   Referring Physician(s): Thekkekandam,Thomas J   History of present illness: HPI from initial consultation 06/28/23 Michelle Huffman is a 57 y.o. female has a medical history significant for lupus, alpha thalassemia major, HTN and right knee osteoarthritis. She has been treated conservatively with steroid injections, analgesics and viscosupplementation without meaningful improvement.  She is not interested in pursuing surgical intervention. She is followed by Dr. Benjamin Stain who has graciously referred this patient to our team to discuss genicular artery embolization.   Her knee pain started after arthroscopic right knee surgery after a fall, has had pain in the knee since.  The pain is worst at night.  She recently had ortho visc injection by Dr. Benjamin Stain, and states the pain has been slightly worse since then.  She is still able to ambulate without difficulty.   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  We discussed GAE in detail and I recommended further imaging to determine the etiology of her knee pain and assess for synovial hyperemia. I explained that these could certainly be present despite no significant osseous changes on last year's imaging. A right knee MRI was ordered and we made plans to follow up afterward. Her MRI was performed 08/20/23 and she presents today via virtual tele-visit for further discussion.  No change in her symptoms - mostly   Past Medical History:  Diagnosis Date   Alpha thalassemia major (HCC)    Anemia    Arthritis    Asthma    Autoimmune disease (HCC)    Blood transfusion    Chest pain    Complication of anesthesia    Hypertension    Hypothyroidism    Lupus    PONV (postoperative nausea and vomiting)     Past Surgical History:  Procedure Laterality Date   ABDOMINAL HYSTERECTOMY  2009   CHOLECYSTECTOMY  07/21/11    IR RADIOLOGIST EVAL & MGMT  06/28/2023   KNEE SURGERY  2010   laproscopic right knee   LAPAROSCOPIC GASTRIC SLEEVE RESECTION N/A 08/19/2014   Procedure: LAPAROSCOPIC GASTRIC SLEEVE RESECTION;  Surgeon: Glenna Fellows, MD;  Location: [facility];  Service: General;  Laterality: N/A;   UPPER GI ENDOSCOPY  08/19/2014   Procedure: UPPER GI ENDOSCOPY;  Surgeon: Glenna Fellows, MD;  Location: [facility];  Service: General;;    Allergies: Sulfa antibiotics  Medications: Prior to Admission medications   Medication Sig Start Date End Date Taking? Authorizing Provider  albuterol (VENTOLIN HFA) 108 (90 Base) MCG/ACT inhaler Inhale 2 puffs into the lungs every 6 (six) hours as needed for shortness of breath. 04/17/23   Breeback, Lonna Cobb, PA-C  cyclobenzaprine (FLEXERIL) 10 MG tablet Take 1 tablet (10 mg total) by mouth 3 (three) times daily as needed for muscle spasms. 09/13/22   Breeback, Lonna Cobb, PA-C  cyclobenzaprine (FLEXERIL) 10 MG tablet Take 1 tablet (10 mg total) by mouth 3 (three) times daily as needed for muscle spasms. 04/17/23   Breeback, Lonna Cobb, PA-C  hydroxychloroquine (PLAQUENIL) 200 MG tablet Take 1 tablet (200 mg total) by mouth 2 (two) times daily. 04/17/23   Breeback, Jade L, PA-C  Iron-FA-B Cmp-C-Biot-Probiotic (FUSION PLUS) CAPS Take 1 capsule by mouth daily. 01/09/23   Breeback, Lonna Cobb, PA-C  lisinopril-hydrochlorothiazide (ZESTORETIC) 10-12.5 MG tablet Take 1 tablet by mouth daily. 06/14/23   Breeback, Lonna Cobb, PA-C  meloxicam (MOBIC) 15 MG tablet Take 1  tablet (15 mg total) by mouth daily. 03/24/23   Breeback, Lesly Rubenstein L, PA-C  metoprolol tartrate (LOPRESSOR) 25 MG tablet Take 25 mg by mouth daily as needed.    [provider]  ondansetron (ZOFRAN) 4 MG tablet Take 1-2 tablets (4-8 mg total) by mouth every 8 (eight) hours as needed for nausea or vomiting. 11/18/18   Eustace Moore, MD  rosuvastatin (CRESTOR) 10 MG tablet Take 1 tablet (10 mg total) by mouth daily. 09/27/22    Breeback, Jade L, PA-C  sertraline (ZOLOFT) 25 MG tablet TAKE 1 TABLET (25 MG TOTAL) BY MOUTH DAILY. 09/15/23   Breeback, Lonna Cobb, PA-C  tamsulosin (FLOMAX) 0.4 MG CAPS capsule Take 1 capsule (0.4 mg total) by mouth daily after supper. 08/03/23   Wallis Bamberg, PA-C  tirzepatide [facility]) 10 MG/0.5ML Pen Inject 10 mg into the skin once a week. 09/05/23   Breeback, Jade L, PA-C  traZODone (DESYREL) 50 MG tablet TAKE 0.5-1 TABLETS BY MOUTH AT BEDTIME AS NEEDED FOR SLEEP. 08/01/23   Jomarie Longs, PA-C     Family History  Problem Relation Age of Onset   Depression Mother    Hypertension Father    Hyperlipidemia Father    Asthma Other    Hypertension Other    Hyperlipidemia Other    Alcohol abuse Neg Hx    Anxiety disorder Neg Hx    Bipolar disorder Neg Hx    Dementia Neg Hx    Drug abuse Neg Hx    Schizophrenia Neg Hx     Social History   Socioeconomic History   Marital status: Single    Spouse name: Not on file   Number of children: 1   Years of education: 13   Highest education level: Some college, no degree  Occupational History  Tobacco Use   Smoking status: Former    Current packs/day: 0.00    Types: Cigarettes    Start date: 11/17/1999    Quit date: 11/17/2007    Years since quitting: 15.8   Smokeless tobacco: Never  Vaping Use   Vaping status: Never Used  Substance and Sexual Activity   Alcohol use: Yes    Comment: socially- a few times a year   Drug use: No   Sexual activity: Not on file  Other Topics Concern   Not on file  Social History Narrative   Pt lives in [place] with her 17yo daughter. Born and raised in [place] by her parents. Pt has 2 bro and pt is the youngest. States it was a good childhood. Pt has completed one year of college. Pt works at [employer] in [employer] and [employer] for last 3 yrs. Pt has never been married.    Social Drivers of Health   Financial Resource Strain: Medium Risk (03/09/2023)   Overall Financial Resource Strain (CARDIA)     Difficulty of Paying Living Expenses: Somewhat hard  Food Insecurity: Food Insecurity Present (03/09/2023)   Hunger Vital Sign    Worried About Running Out of Food in the Last Year: Never true    Ran Out of Food in the Last Year: Sometimes true  Transportation Needs: No Transportation Needs (03/09/2023)   PRAPARE - Administrator, Civil Service (Medical): No    Lack of Transportation (Non-Medical): No  Physical Activity: Insufficiently Active (03/09/2023)   Exercise Vital Sign    Days of Exercise per Week: 2 days    Minutes of Exercise per Session: 20 min  Stress:  Stress Concern Present (03/09/2023)   [employer] of Occupational Health - Occupational Stress Questionnaire    Feeling of Stress : Rather much  Social Connections: Socially Isolated (03/09/2023)   Social Connection and Isolation Panel [NHANES]    Frequency of Communication with Friends and Family: Once a week    Frequency of Social Gatherings with Friends and Family: Once a week    Attends Religious Services: More than 4 times per year    Active Member of [employer] or Organizations: No    Attends Engineer, structural: Not on file    Marital Status: Never married     Vital Signs: There were no vitals taken for this visit.  No physical exam was performed in lieu of virtual telephone visit.    Imaging: Knee radiograph 07/21/22  No significant degenerative change.   MRI R Knee 07/24/22  IMPRESSION: 1. Ill-defined subchondral T2 hyperintensity within the lateral tibial plateau most likely represents reactive/degenerative edema or a postoperative finding. No clear radiographic correlate. 2. Possible mild lateral compartment degenerative chondrosis with small joint effusion. 3. The menisci, cruciate and collateral ligaments are intact.   MRI right knee 08/20/23 IMPRESSION: Mild fraying along the free edge of the body of the lateral meniscus. The exam is otherwise negative.  Previously seen mild  marrow edema in the lateral tibial plateau has resolved and was likely due to stress change.      Labs:  CBC: Recent Labs    11/09/22 0040  WBC 7.7  HGB 10.0*  HCT 32.9*  PLT 238    COAGS: No results for input(s): "INR", "APTT" in the last 8760 hours.  BMP: Recent Labs    11/09/22 0040 03/09/23 1444  NA 141 142  K 3.3* 3.7  CL 104 106  CO2 24 22  GLUCOSE 104* 92  BUN 14 15  CALCIUM 10.5* 9.4  CREATININE 0.87 0.89  GFRNONAA >60  --     LIVER FUNCTION TESTS: Recent Labs    11/09/22 0040  BILITOT 0.4  AST 27  ALT 21  ALKPHOS 71  PROT 9.7*  ALBUMIN 4.9    Assessment and Plan: 57 year old female with a history significant for chronic right knee pain.  MRI is unremarkable without significant synovial hyperemia or degenerative change.  I am uncertain the etiology of her nocturnal knee pain.  She has responded well in the past to steroid injections, so this may be best route chronically.  The patient asked [place] to reach out to Dr. Lucienne Minks office to schedule an additional knee injection, I will relay this to him.  Follow up with IR as needed.  Marliss Coots, MD Pager: [phone]    I spent a total of 15 Minutes in virtual telephone clinical consultation, greater than 50% of which was counseling/coordinating care for right knee pain.

## 2023-09-22 ENCOUNTER — Ambulatory Visit
Admission: RE | Admit: 2023-09-22 | Discharge: 2023-09-22 | Disposition: A | Discharge status: Home / Self Care | Payer: No Typology Code available for payment source | Source: Ambulatory Visit | Attending: Interventional Radiology | Admitting: Interventional Radiology

## 2023-09-22 DIAGNOSIS — G8929 Other chronic pain: Secondary | ICD-10-CM

## 2023-09-22 HISTORY — PX: IR RADIOLOGIST EVAL & MGMT: IMG5224

## 2023-09-25 ENCOUNTER — Other Ambulatory Visit: Payer: Self-pay | Admitting: Physician Assistant

## 2023-09-25 DIAGNOSIS — E039 Hypothyroidism, unspecified: Secondary | ICD-10-CM

## 2023-09-25 DIAGNOSIS — D508 Other iron deficiency anemias: Secondary | ICD-10-CM

## 2023-09-25 DIAGNOSIS — R7303 Prediabetes: Secondary | ICD-10-CM

## 2023-09-25 DIAGNOSIS — I10 Essential (primary) hypertension: Secondary | ICD-10-CM

## 2023-09-25 DIAGNOSIS — E559 Vitamin D deficiency, unspecified: Secondary | ICD-10-CM

## 2023-09-25 DIAGNOSIS — M329 Systemic lupus erythematosus, unspecified: Secondary | ICD-10-CM

## 2023-09-25 DIAGNOSIS — E782 Mixed hyperlipidemia: Secondary | ICD-10-CM

## 2023-10-02 ENCOUNTER — Ambulatory Visit (INDEPENDENT_AMBULATORY_CARE_PROVIDER_SITE_OTHER): Payer: No Typology Code available for payment source | Admitting: Sports Medicine

## 2023-10-02 ENCOUNTER — Other Ambulatory Visit (INDEPENDENT_AMBULATORY_CARE_PROVIDER_SITE_OTHER): Payer: No Typology Code available for payment source

## 2023-10-02 DIAGNOSIS — M1711 Unilateral primary osteoarthritis, right knee: Secondary | ICD-10-CM

## 2023-10-02 MED ORDER — TRAMADOL HCL 50 MG PO TABS: 50.0000 mg | ORAL_TABLET | Freq: Three times a day (TID) | ORAL | 0 refills | Status: DC | PRN

## 2023-10-02 NOTE — Assessment & Plan Note (Signed)
Pleasant 57 year old female, known knee osteoarthritis, she has tried therapy, steroid injections, analgesics and viscosupplementation without improvement, an MRI was obtained, she did not have enough loss of cartilage or synovial hyperemia to consider geniculate artery embolization. The MRI did show some free edge tearing of the lateral meniscus. At this point as I think she has failed conservative treatment I would like her to consult again with [facility] for discussion of arthroscopic intervention.

## 2023-10-02 NOTE — Progress Notes (Signed)
    Procedures performed today:    Procedure: Real-time Ultrasound Guided injection of the right knee Device: Samsung HS60  Verbal informed consent obtained.  Time-out conducted.  Noted no overlying erythema, induration, or other signs of local infection.  Skin prepped in a sterile fashion.  Local anesthesia: Topical Ethyl chloride.  With sterile technique and under real time ultrasound guidance: No effusion noted, 1 cc Kenalog 40, 2 cc lidocaine, 2 cc bupivacaine injected easily Completed without difficulty  Advised to call if fevers/chills, erythema, induration, drainage, or persistent bleeding.  Images permanently stored and available for review in PACS.  Impression: Technically successful ultrasound guided injection.  Independent interpretation of notes and tests performed by another provider:   None.  Brief History, Exam, Impression, and Recommendations:    Primary osteoarthritis of right knee Pleasant 57 year old female, known knee osteoarthritis, she has tried therapy, steroid injections, analgesics and viscosupplementation without improvement, an MRI was obtained, she did not have enough loss of cartilage or synovial hyperemia to consider geniculate artery embolization. The MRI did show some free edge tearing of the lateral meniscus. At this point as I think she has failed conservative treatment I would like her to consult again with [facility] for discussion of arthroscopic intervention.    ____________________________________________ Ihor Austin. Benjamin Stain, M.D., ABFM., CAQSM., AME. Primary Care and Sports Medicine [facility]  Adjunct Professor of Family Medicine  [employer]  Restaurant manager, fast food

## 2023-10-03 DIAGNOSIS — M1711 Unilateral primary osteoarthritis, right knee: Secondary | ICD-10-CM

## 2023-10-03 MED ORDER — TRIAMCINOLONE ACETONIDE 40 MG/ML IJ SUSP
40.0000 mg | Freq: Once | INTRAMUSCULAR | Status: AC
  Administered 2023-10-03: 40 mg via INTRAMUSCULAR

## 2023-10-03 NOTE — Code Documentation (Signed)
done

## 2023-10-04 DIAGNOSIS — Z419 Encounter for procedure for purposes other than remedying health state, unspecified: Secondary | ICD-10-CM | POA: Diagnosis not present

## 2023-10-23 MED ORDER — AZITHROMYCIN 250 MG PO TABS: ORAL_TABLET | ORAL | 0 refills | Status: DC

## 2023-10-23 NOTE — Addendum Note (Signed)
Addended by: Jomarie Longs on: 10/23/2023 04:34 PM   Modules accepted: Orders

## 2023-11-04 DIAGNOSIS — Z419 Encounter for procedure for purposes other than remedying health state, unspecified: Secondary | ICD-10-CM | POA: Diagnosis not present

## 2023-11-20 ENCOUNTER — Other Ambulatory Visit: Payer: Self-pay | Admitting: Physician Assistant

## 2023-11-20 ENCOUNTER — Encounter: Payer: Self-pay | Admitting: Physician Assistant

## 2023-11-20 DIAGNOSIS — R7303 Prediabetes: Secondary | ICD-10-CM

## 2023-11-20 DIAGNOSIS — E663 Overweight: Secondary | ICD-10-CM

## 2023-11-20 DIAGNOSIS — Z9884 Bariatric surgery status: Secondary | ICD-10-CM

## 2023-11-20 DIAGNOSIS — I10 Essential (primary) hypertension: Secondary | ICD-10-CM

## 2023-11-21 ENCOUNTER — Other Ambulatory Visit: Payer: Self-pay

## 2023-11-21 MED ORDER — IBUPROFEN 800 MG PO TABS: 800.0000 mg | ORAL_TABLET | Freq: Three times a day (TID) | ORAL | 0 refills | Status: DC | PRN

## 2023-11-21 MED ORDER — MOUNJARO 10 MG/0.5ML ~~LOC~~ SOAJ
10.0000 mg | SUBCUTANEOUS | 0 refills | Status: DC
  Filled 2023-12-01: qty 6, 84d supply, fill #0

## 2023-11-21 MED ORDER — MOUNJARO 10 MG/0.5ML ~~LOC~~ SOAJ: 10.0000 mg | SUBCUTANEOUS | 0 refills | Status: DC

## 2023-12-01 ENCOUNTER — Other Ambulatory Visit: Payer: Self-pay

## 2023-12-02 DIAGNOSIS — Z419 Encounter for procedure for purposes other than remedying health state, unspecified: Secondary | ICD-10-CM | POA: Diagnosis not present

## 2023-12-26 ENCOUNTER — Telehealth: Payer: Self-pay | Admitting: Pharmacy Technician

## 2023-12-26 ENCOUNTER — Other Ambulatory Visit (HOSPITAL_COMMUNITY): Payer: Self-pay

## 2023-12-26 NOTE — Telephone Encounter (Signed)
 Pharmacy Patient Advocate Encounter   Received notification from Onbase that prior authorization for [facility] 10MG /0.5ML AUTO-INJECTORS is required/requested.   Insurance verification completed.   The patient is insured through [employer] .   Per test claim: Refill too soon. PA is not needed at this time. Medication was filled 11/21/2023. Next eligible fill date is 02/07/2024.

## 2024-01-13 DIAGNOSIS — Z419 Encounter for procedure for purposes other than remedying health state, unspecified: Secondary | ICD-10-CM | POA: Diagnosis not present

## 2024-01-18 ENCOUNTER — Other Ambulatory Visit: Payer: Self-pay | Admitting: Physician Assistant

## 2024-01-22 ENCOUNTER — Encounter: Payer: Self-pay | Admitting: Physician Assistant

## 2024-02-08 ENCOUNTER — Other Ambulatory Visit: Payer: Self-pay | Admitting: Physician Assistant

## 2024-02-08 DIAGNOSIS — E663 Overweight: Secondary | ICD-10-CM

## 2024-02-08 DIAGNOSIS — I10 Essential (primary) hypertension: Secondary | ICD-10-CM

## 2024-02-08 DIAGNOSIS — R7303 Prediabetes: Secondary | ICD-10-CM

## 2024-02-08 DIAGNOSIS — Z9884 Bariatric surgery status: Secondary | ICD-10-CM

## 2024-02-09 MED ORDER — IBUPROFEN 800 MG PO TABS: 800.0000 mg | ORAL_TABLET | Freq: Three times a day (TID) | ORAL | 0 refills | Status: DC | PRN

## 2024-02-09 MED ORDER — MOUNJARO 10 MG/0.5ML ~~LOC~~ SOAJ: 10.0000 mg | SUBCUTANEOUS | 0 refills | Status: DC

## 2024-02-12 DIAGNOSIS — Z419 Encounter for procedure for purposes other than remedying health state, unspecified: Secondary | ICD-10-CM | POA: Diagnosis not present

## 2024-02-14 ENCOUNTER — Encounter: Payer: Self-pay | Admitting: Physician Assistant

## 2024-02-14 DIAGNOSIS — Z1231 Encounter for screening mammogram for malignant neoplasm of breast: Secondary | ICD-10-CM

## 2024-02-14 NOTE — Telephone Encounter (Signed)
 Ok to place order for a [place] location for screening mammogram.

## 2024-02-20 ENCOUNTER — Encounter: Payer: Self-pay | Admitting: Physician Assistant

## 2024-02-20 DIAGNOSIS — F332 Major depressive disorder, recurrent severe without psychotic features: Secondary | ICD-10-CM

## 2024-02-20 DIAGNOSIS — Z1231 Encounter for screening mammogram for malignant neoplasm of breast: Secondary | ICD-10-CM

## 2024-02-20 DIAGNOSIS — F411 Generalized anxiety disorder: Secondary | ICD-10-CM

## 2024-02-20 DIAGNOSIS — Z1211 Encounter for screening for malignant neoplasm of colon: Secondary | ICD-10-CM

## 2024-03-08 ENCOUNTER — Ambulatory Visit (INDEPENDENT_AMBULATORY_CARE_PROVIDER_SITE_OTHER)

## 2024-03-08 ENCOUNTER — Ambulatory Visit (INDEPENDENT_AMBULATORY_CARE_PROVIDER_SITE_OTHER): Admitting: Physician Assistant

## 2024-03-08 VITALS — BP 130/80 | HR 88 | Ht 64.0 in | Wt 156.0 lb

## 2024-03-08 DIAGNOSIS — F332 Major depressive disorder, recurrent severe without psychotic features: Secondary | ICD-10-CM

## 2024-03-08 DIAGNOSIS — Z9884 Bariatric surgery status: Secondary | ICD-10-CM

## 2024-03-08 DIAGNOSIS — M79641 Pain in right hand: Secondary | ICD-10-CM

## 2024-03-08 DIAGNOSIS — M25641 Stiffness of right hand, not elsewhere classified: Secondary | ICD-10-CM | POA: Diagnosis not present

## 2024-03-08 DIAGNOSIS — M79642 Pain in left hand: Secondary | ICD-10-CM

## 2024-03-08 DIAGNOSIS — Z23 Encounter for immunization: Secondary | ICD-10-CM

## 2024-03-08 DIAGNOSIS — F5101 Primary insomnia: Secondary | ICD-10-CM

## 2024-03-08 DIAGNOSIS — M25642 Stiffness of left hand, not elsewhere classified: Secondary | ICD-10-CM | POA: Diagnosis not present

## 2024-03-08 DIAGNOSIS — F411 Generalized anxiety disorder: Secondary | ICD-10-CM

## 2024-03-08 DIAGNOSIS — E663 Overweight: Secondary | ICD-10-CM

## 2024-03-08 DIAGNOSIS — M1711 Unilateral primary osteoarthritis, right knee: Secondary | ICD-10-CM

## 2024-03-08 DIAGNOSIS — Z13220 Encounter for screening for lipoid disorders: Secondary | ICD-10-CM

## 2024-03-08 DIAGNOSIS — M329 Systemic lupus erythematosus, unspecified: Secondary | ICD-10-CM

## 2024-03-08 DIAGNOSIS — Z79899 Other long term (current) drug therapy: Secondary | ICD-10-CM

## 2024-03-08 DIAGNOSIS — R7303 Prediabetes: Secondary | ICD-10-CM

## 2024-03-08 DIAGNOSIS — Z131 Encounter for screening for diabetes mellitus: Secondary | ICD-10-CM

## 2024-03-08 DIAGNOSIS — R6889 Other general symptoms and signs: Secondary | ICD-10-CM

## 2024-03-08 DIAGNOSIS — I10 Essential (primary) hypertension: Secondary | ICD-10-CM | POA: Diagnosis not present

## 2024-03-08 DIAGNOSIS — E039 Hypothyroidism, unspecified: Secondary | ICD-10-CM

## 2024-03-08 MED ORDER — TRAZODONE HCL 50 MG PO TABS: 50.0000 mg | ORAL_TABLET | Freq: Every day | ORAL | 1 refills | Status: DC

## 2024-03-08 MED ORDER — MOUNJARO 10 MG/0.5ML ~~LOC~~ SOAJ: 10.0000 mg | SUBCUTANEOUS | 1 refills | Status: DC

## 2024-03-08 MED ORDER — HYDROXYCHLOROQUINE SULFATE 200 MG PO TABS: 200.0000 mg | ORAL_TABLET | Freq: Two times a day (BID) | ORAL | 1 refills | Status: DC

## 2024-03-08 MED ORDER — SERTRALINE HCL 25 MG PO TABS: 25.0000 mg | ORAL_TABLET | Freq: Every day | ORAL | 1 refills | Status: DC

## 2024-03-08 MED ORDER — LISINOPRIL-HYDROCHLOROTHIAZIDE 10-12.5 MG PO TABS: 1.0000 | ORAL_TABLET | Freq: Every day | ORAL | 1 refills | Status: DC

## 2024-03-08 MED ORDER — TRAMADOL HCL 50 MG PO TABS: 50.0000 mg | ORAL_TABLET | Freq: Three times a day (TID) | ORAL | 0 refills | Status: DC | PRN

## 2024-03-08 MED ORDER — MELOXICAM 15 MG PO TABS: 15.0000 mg | ORAL_TABLET | Freq: Every day | ORAL | 1 refills | Status: AC

## 2024-03-08 NOTE — Progress Notes (Signed)
 Established Patient Office Visit  Subjective   Patient ID: Michelle Huffman, female    DOB: 1966/03/21  Age: 58 y.o. MRN: [id number]  Chief Complaint  Patient presents with   Medical Management of Chronic Issues    Joint pain    HPI Pt is a 57 yo female with pre-diabetes, insomnia, hypothyroidism, SLE, HTN, MDD, GAD who presents to the clinic for medication refills.   Pt is doing very well. She has been on mounjaro  and lost 35lbs over the past year. She is much more active and feels much better. She does need refills today. She is walking regularly but no organized exercise. She denies any CP, palpitations, headaches or vision changes.   She is having more hand pain, stiffiness, swelling. She wonders if OA or RA. She has SLE. NSAIDs do help.   Her hands often get really cold and wants to be able to have a heater at her desk at work. She needs note.   Pt mood and sleeping doing well on medication. No concerns.   .. Active Ambulatory Problems    Diagnosis Date Noted   PITUITARY ADENOMA, BENIGN 07/25/2007   Hypothyroidism 05/03/2007   ANEMIA-IRON  DEFICIENCY 05/03/2007   PAROTIDITIS 10/11/2007   Biliary dyskinesia 07/11/2011   Right shoulder pain 03/06/2014   Morbid obesity (HCC) 03/27/2014   Insomnia 11/17/2015   GAD (generalized anxiety disorder) 11/17/2015   Severe episode of recurrent major depressive disorder, without psychotic features (HCC) 11/17/2015   Systemic lupus erythematosus (HCC) 07/20/2022   Plantar fascial fibromatosis of right foot 07/20/2022   Primary osteoarthritis of right knee 07/20/2022   Class 2 obesity due to excess calories without serious comorbidity with body mass index (BMI) of 37.0 to 37.9 in adult 08/16/2022   S/P laparoscopic sleeve gastrectomy 08/16/2022   Primary hypertension 08/16/2022   Alpha thalassemia trait 03/31/2015   ANA positive 10/17/2013   Asthma 10/17/2013   Elevated sed rate 10/17/2013   Iron  deficiency 01/14/2016   Mixed  hyperlipidemia 03/31/2021   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   Recurrent bronchospasm 07/23/2013   Rheumatoid arthritis (HCC) 10/29/2013   Microcytic anemia 08/16/2022   Elevated LDL cholesterol level 08/17/2022   Pre-diabetes 08/17/2022   Mid back pain 09/13/2022   DDD (degenerative disc disease), thoracic 09/14/2022   Lumbar degenerative disc disease 01/10/2023   Positive TB test 06/14/2023   Overweight (BMI 25.0-29.9) 06/14/2023   Bilateral hand pain 03/08/2024   Cold intolerance of hand 03/11/2024   Resolved Ambulatory Problems    Diagnosis Date Noted   No Resolved Ambulatory Problems   Past Medical History:  Diagnosis Date   Alpha thalassemia major (HCC)    Anemia    Arthritis    Autoimmune disease (HCC)    Blood transfusion    Chest pain    Complication of anesthesia    Hypertension    Lupus    PONV (postoperative nausea and vomiting)       Review of Systems  All other systems reviewed and are negative.     Objective:     BP 130/80   Pulse 88   Ht 5\' 4"  (1.626 m)   Wt 156 lb (70.8 kg)   SpO2 99%   BMI 26.78 kg/m  BP Readings from Last 3 Encounters:  03/08/24 130/80  08/03/23 (!) 139/92  06/14/23 136/82   Wt Readings from Last 3 Encounters:  03/08/24 156 lb (70.8 kg)  06/14/23 169 lb (76.7 kg)  12/16/22 192 lb (87.1 kg)    .Michelle Huffman  03/11/2024    7:25 AM 08/18/2022   11:41 AM  Depression screen PHQ 2/9  Decreased Interest 0 0  Down, Depressed, Hopeless 1 1  PHQ - 2 Score 1 1  Altered sleeping 0 0  Tired, decreased energy 1 1  Change in appetite 0 0  Feeling bad or failure about yourself  0 1  Trouble concentrating 0 0  Moving slowly or fidgety/restless 0 0  Suicidal thoughts 0 0  PHQ-9 Score 2 3  Difficult doing work/chores  Not difficult at all   ..    03/11/2024    7:25 AM 08/18/2022   11:41 AM  GAD 7 : Generalized Anxiety Score  Nervous, Anxious, on Edge 1 0  Control/stop worrying 0 1  Worry too much - different things 1 1  Trouble  relaxing 0 1  Restless 0 0  Easily annoyed or irritable 1 0  Afraid - awful might happen 0 0  Total GAD 7 Score 3 3  Anxiety Difficulty Not difficult at all Not difficult at all      Physical Exam Constitutional:      Appearance: Normal appearance.  HENT:     Head: Normocephalic.  Cardiovascular:     Rate and Rhythm: Normal rate and regular rhythm.     Pulses: Normal pulses.     Heart sounds: Normal heart sounds.  Pulmonary:     Effort: Pulmonary effort is normal.     Breath sounds: Normal breath sounds.  Musculoskeletal:     Right lower leg: No edema.     Left lower leg: No edema.  Neurological:     General: No focal deficit present.     Mental Status: She is alert and oriented to person, place, and time.  Psychiatric:        Mood and Affect: Mood normal.      The 10-year ASCVD risk score (Arnett DK, et al., 2019) is: 7.8%    Assessment & Plan:  Michelle AasAaron AasDenita was seen today for medical management of chronic issues.  Diagnoses and all orders for this visit:  Pre-diabetes -     tirzepatide  (MOUNJARO ) 10 MG/0.5ML Pen; Inject 10 mg into the skin once a week. -     Hemoglobin A1c  Systemic lupus erythematosus, unspecified SLE type, unspecified organ involvement status (HCC) -     hydroxychloroquine  (PLAQUENIL ) 200 MG tablet; Take 1 tablet (200 mg total) by mouth 2 (two) times daily.  Overweight (BMI 25.0-29.9) -     tirzepatide  (MOUNJARO ) 10 MG/0.5ML Pen; Inject 10 mg into the skin once a week.  Primary hypertension -     tirzepatide  (MOUNJARO ) 10 MG/0.5ML Pen; Inject 10 mg into the skin once a week. -     lisinopril -hydrochlorothiazide  (ZESTORETIC ) 10-12.5 MG tablet; Take 1 tablet by mouth daily.  S/P laparoscopic sleeve gastrectomy -     tirzepatide  (MOUNJARO ) 10 MG/0.5ML Pen; Inject 10 mg into the skin once a week.  Primary osteoarthritis of right knee -     meloxicam  (MOBIC ) 15 MG tablet; Take 1 tablet (15 mg total) by mouth daily. -     traMADol  (ULTRAM ) 50 MG  tablet; Take 1 tablet (50 mg total) by mouth every 8 (eight) hours as needed for moderate pain (pain score 4-6).  Bilateral hand pain -     meloxicam  (MOBIC ) 15 MG tablet; Take 1 tablet (15 mg total) by mouth daily. -     Rheumatoid Arthritis Profile -     DG Hand Complete Right;  Future -     DG Hand Complete Left; Future  Medication management -     Lipid panel -     CMP14+EGFR -     TSH + free T4 -     Hemoglobin A1c -     Fe+TIBC+Fer -     CBC w/Diff/Platelet -     VITAMIN D 25 Hydroxy (Vit-D Deficiency, Fractures) -     B12 and Folate Panel  Screening for diabetes mellitus -     CMP14+EGFR  Screening for lipid disorders -     Lipid panel  Immunization due -     Pneumococcal conjugate vaccine 20-valent (Prevnar 20)  Cold intolerance of hand  Acquired hypothyroidism  Primary insomnia -     traZODone  (DESYREL ) 50 MG tablet; Take 1 tablet (50 mg total) by mouth at bedtime. TAKE 0.5-1 TABLETS BY MOUTH AT BEDTIME AS NEEDED FOR SLEEP.  Need for Tdap vaccination -     Tdap vaccine greater than or equal to 7yo IM  Severe episode of recurrent major depressive disorder, without psychotic features (HCC) -     sertraline  (ZOLOFT ) 25 MG tablet; Take 1 tablet (25 mg total) by mouth daily.  GAD (generalized anxiety disorder) -     sertraline  (ZOLOFT ) 25 MG tablet; Take 1 tablet (25 mg total) by mouth daily.   Letter written to be able to have heater at work due to her cold intolerance Tdap and pneumonia vaccine given today Vaccines UTD  PHQ/GAD no concerns Zoloft  and trazodone  refilled  OA vs RA of bilateral hand pain Xrays ordered Mobic  to start daily with tramadol  for break through pain as needed Hand exercises given with symptomatic care discussed   Fasting labs ordered Continue mounjaro  Continue to work on healthy lifestyle with diet and exercise     Return in about 6 months (around 09/07/2024).    Ridley Schewe, PA-C

## 2024-03-11 ENCOUNTER — Encounter: Payer: Self-pay | Admitting: Physician Assistant

## 2024-03-11 ENCOUNTER — Ambulatory Visit: Payer: Self-pay | Admitting: Physician Assistant

## 2024-03-11 ENCOUNTER — Other Ambulatory Visit (HOSPITAL_COMMUNITY): Payer: Self-pay

## 2024-03-11 ENCOUNTER — Telehealth: Payer: Self-pay

## 2024-03-11 ENCOUNTER — Other Ambulatory Visit: Payer: Self-pay

## 2024-03-11 DIAGNOSIS — R6889 Other general symptoms and signs: Secondary | ICD-10-CM | POA: Insufficient documentation

## 2024-03-11 DIAGNOSIS — Z124 Encounter for screening for malignant neoplasm of cervix: Secondary | ICD-10-CM

## 2024-03-11 DIAGNOSIS — D649 Anemia, unspecified: Secondary | ICD-10-CM

## 2024-03-11 DIAGNOSIS — E559 Vitamin D deficiency, unspecified: Secondary | ICD-10-CM

## 2024-03-11 DIAGNOSIS — M329 Systemic lupus erythematosus, unspecified: Secondary | ICD-10-CM

## 2024-03-11 NOTE — Addendum Note (Signed)
 Addended by: Dickie Found on: 03/11/2024 03:48 PM   Modules accepted: Orders

## 2024-03-11 NOTE — Telephone Encounter (Signed)
 Mounjaro  10mg   needing Prior auth

## 2024-03-11 NOTE — Progress Notes (Signed)
 No signs of autoimmune arthritis. You do have some Distal joint arthritis in index finger.

## 2024-03-11 NOTE — Telephone Encounter (Signed)
 Pharmacy Patient Advocate Encounter   Received notification from CoverMyMeds that prior authorization for traMADol  HCl 50MG  tablets is required/requested.   Insurance verification completed.   The patient is insured through [facility] [place] .   Per test claim: PA required; PA submitted to above mentioned insurance via CoverMyMeds Key/confirmation #/EOC [id number] Status is pending

## 2024-03-11 NOTE — Telephone Encounter (Signed)
 Pended referral but did you want to have patient just schedule with you for PAP?

## 2024-03-11 NOTE — Progress Notes (Signed)
 No signs of autoimmune arthritis and no significant signs of osteoarthritis.

## 2024-03-11 NOTE — Telephone Encounter (Signed)
 Pharmacy Patient Advocate Encounter  Received notification from [facility] Medicaid that Prior Authorization for  traMADol  HCl 50MG  tablets  has been APPROVED from 03/11/24 to 03/11/25. Unable to obtain price due to refill too soon rejection, last fill date 03/11/24 next available fill date6/12/25   PA #/Case ID/Reference #: [id number]    Approved. This drug has been approved. Approved quantity: 21 tablets per 7 day(s).

## 2024-03-11 NOTE — Telephone Encounter (Signed)
 Pended referral

## 2024-03-12 NOTE — Addendum Note (Signed)
 Addended by: Araceli Knight on: 03/12/2024 12:35 PM   Modules accepted: Orders

## 2024-03-13 ENCOUNTER — Telehealth: Payer: Self-pay

## 2024-03-13 NOTE — Telephone Encounter (Signed)
 Ozempic /Mounjaro  is approved exclusively as an adjunct to diet and exercise to improve glycemic control in adults with type 2 diabetes mellitus. A review of patient's medical chart reveals no documented diagnosis of type 2 diabetes or an A1C indicative of diabetes. Therefore, they do not currently meet the criteria for prior authorization of this medication. If clinically appropriate, alternative options such as Saxenda , Zepbound , or Wegovy  may be considered for this patient.  *****If the A1C from today comes back over 6.5 and there is a new Type 2 diabetes diagnosis, we can run the prior authorization then.  A user error has taken place: encounter opened in error, closed for administrative reasons.

## 2024-03-13 NOTE — Telephone Encounter (Signed)
 Pharmacy Patient Advocate Encounter   Received notification from Pt Calls Messages that prior authorization for Mounjaro  10MG /0.5ML auto-injectors is required/requested.   Insurance verification completed.   The patient is insured through [employer] .    Ozempic/Mounjaro  is approved exclusively as an adjunct to diet and exercise to improve glycemic  control in adults with type 2 diabetes mellitus. A review of patient's medical chart reveals no  documented diagnosis of type 2 diabetes or an A1C indicative of diabetes. Therefore, they do not  currently meet the criteria for prior authorization of this medication. If clinically appropriate, alternative  options such as Saxenda, Zepbound , or Frederik Jansky may be considered for this patient.    Archived Key: [id number]

## 2024-03-13 NOTE — Telephone Encounter (Signed)
 Let patient know they are not covering it unless you have ever had A1c greater than 6.5.

## 2024-03-14 DIAGNOSIS — Z419 Encounter for procedure for purposes other than remedying health state, unspecified: Secondary | ICD-10-CM | POA: Diagnosis not present

## 2024-03-18 ENCOUNTER — Other Ambulatory Visit (HOSPITAL_COMMUNITY): Payer: Self-pay

## 2024-03-18 MED ORDER — BUPROPION HCL ER (SR) 150 MG PO TB12
150.0000 mg | ORAL_TABLET | Freq: Two times a day (BID) | ORAL | 1 refills | Status: DC
Start: 2024-03-18 — End: 2024-08-02
  Filled 2024-03-18: qty 180, 90d supply, fill #0

## 2024-03-22 ENCOUNTER — Other Ambulatory Visit (HOSPITAL_COMMUNITY): Payer: Self-pay

## 2024-03-22 DIAGNOSIS — E559 Vitamin D deficiency, unspecified: Secondary | ICD-10-CM | POA: Insufficient documentation

## 2024-03-22 LAB — LIPID PANEL
Chol/HDL Ratio: 5.6 ratio — ABNORMAL HIGH (ref 0.0–4.4)
LDL Chol Calc (NIH): 148 mg/dL — ABNORMAL HIGH (ref 0–99)
Triglycerides: 175 mg/dL — ABNORMAL HIGH (ref 0–149)

## 2024-03-22 LAB — CMP14+EGFR
Albumin: 4.2 g/dL (ref 3.8–4.9)
BUN/Creatinine Ratio: 15 (ref 9–23)
Bilirubin Total: 0.3 mg/dL (ref 0.0–1.2)
Chloride: 109 mmol/L — ABNORMAL HIGH (ref 96–106)
Potassium: 3.9 mmol/L (ref 3.5–5.2)
Sodium: 144 mmol/L (ref 134–144)

## 2024-03-22 LAB — IRON,TIBC AND FERRITIN PANEL
Ferritin: 118 ng/mL (ref 15–150)
Iron Saturation: 32 % (ref 15–55)
Iron: 72 ug/dL (ref 27–159)
UIBC: 152 ug/dL (ref 131–425)

## 2024-03-22 LAB — CBC WITH DIFFERENTIAL/PLATELET
Basophils Absolute: 0 10*3/uL (ref 0.0–0.2)
Eos: 5 %
Hemoglobin: 9 g/dL — ABNORMAL LOW (ref 11.1–15.9)
Immature Grans (Abs): 0 10*3/uL (ref 0.0–0.1)
Immature Granulocytes: 0 %
MCH: 21.3 pg — ABNORMAL LOW (ref 26.6–33.0)
MCHC: 29.6 g/dL — ABNORMAL LOW (ref 31.5–35.7)
Monocytes Absolute: 0.3 10*3/uL (ref 0.1–0.9)
Neutrophils Absolute: 2.2 10*3/uL (ref 1.4–7.0)
Platelets: 256 10*3/uL (ref 150–450)

## 2024-03-22 LAB — RHEUMATOID ARTHRITIS PROFILE: Rheumatoid fact SerPl-aCnc: 14.2 [IU]/mL — ABNORMAL HIGH (ref <14.0)

## 2024-03-22 LAB — TSH+FREE T4: Free T4: 0.93 ng/dL (ref 0.82–1.77)

## 2024-03-22 MED ORDER — ROSUVASTATIN CALCIUM 10 MG PO TABS
10.0000 mg | ORAL_TABLET | Freq: Every day | ORAL | 3 refills | Status: AC
  Filled 2024-03-22: qty 90, 90d supply, fill #0

## 2024-03-22 NOTE — Addendum Note (Signed)
 Addended by: Dickie Found on: 03/22/2024 02:28 PM   Modules accepted: Orders

## 2024-03-22 NOTE — Telephone Encounter (Signed)
 Patient requesting rx rf of Crestor  10mg   Last written 09/27/2022 Last OV 03/08/2024 Upcoming appt 09/06/2024 Spoke with patient to verify that she was requesting crestor  refill as was last filled in 2023 - she states that this is correct - requesting refill - states she has not taken this medication for awhile.

## 2024-03-22 NOTE — Progress Notes (Signed)
 Rheumatoid factor elevated. We will send this to rheumatology as well. Where were you dx with SLE? They are wanting those notes?  Vitamin D is super low. Start once weekly for 3 months and then lets recheck levels.  A1C is so much better at 5.3.  Vitamin b12 normal range.  Folate on low side. Start folic acid 400mcg daily.  Thyroid  looks good.  Hemoglobin low but your serum iron  and ferritin look good.  Cholesterol not to goal. Are you taking the crestor ?

## 2024-03-25 ENCOUNTER — Encounter: Payer: Self-pay | Admitting: Physician Assistant

## 2024-03-25 ENCOUNTER — Other Ambulatory Visit (HOSPITAL_COMMUNITY): Payer: Self-pay

## 2024-03-25 DIAGNOSIS — Z9884 Bariatric surgery status: Secondary | ICD-10-CM

## 2024-03-25 DIAGNOSIS — R7303 Prediabetes: Secondary | ICD-10-CM

## 2024-03-25 DIAGNOSIS — E663 Overweight: Secondary | ICD-10-CM

## 2024-03-25 DIAGNOSIS — I10 Essential (primary) hypertension: Secondary | ICD-10-CM

## 2024-03-25 MED ORDER — ZEPBOUND 10 MG/0.5ML ~~LOC~~ SOAJ
10.0000 mg | SUBCUTANEOUS | 1 refills | Status: DC
  Filled 2024-03-25: qty 2, 28d supply, fill #0

## 2024-03-25 NOTE — Telephone Encounter (Signed)
 Rheumatology is wanting these so they can determine if they will see her or not?

## 2024-03-26 ENCOUNTER — Other Ambulatory Visit (HOSPITAL_COMMUNITY): Payer: Self-pay

## 2024-03-26 ENCOUNTER — Telehealth: Payer: Self-pay

## 2024-03-26 DIAGNOSIS — D649 Anemia, unspecified: Secondary | ICD-10-CM | POA: Insufficient documentation

## 2024-03-26 LAB — CMP14+EGFR
ALT: 7 IU/L (ref 0–32)
AST: 15 IU/L (ref 0–40)
Alkaline Phosphatase: 77 IU/L (ref 44–121)
BUN: 15 mg/dL (ref 6–24)
CO2: 22 mmol/L (ref 20–29)
Calcium: 9.1 mg/dL (ref 8.7–10.2)
Creatinine, Ser: 0.98 mg/dL (ref 0.57–1.00)
Globulin, Total: 3.1 g/dL (ref 1.5–4.5)
Glucose: 79 mg/dL (ref 70–99)
Total Protein: 7.3 g/dL (ref 6.0–8.5)
eGFR: 68 mL/min/{1.73_m2} (ref >59)

## 2024-03-26 LAB — CBC WITH DIFFERENTIAL/PLATELET
Basos: 1 %
EOS (ABSOLUTE): 0.2 10*3/uL (ref 0.0–0.4)
Hematocrit: 30.4 % — ABNORMAL LOW (ref 34.0–46.6)
Lymphocytes Absolute: 1.2 10*3/uL (ref 0.7–3.1)
Lymphs: 30 %
MCV: 72 fL — ABNORMAL LOW (ref 79–97)
Monocytes: 7 %
Neutrophils: 57 %
RBC: 4.22 x10E6/uL (ref 3.77–5.28)
RDW: 18.3 % — ABNORMAL HIGH (ref 11.7–15.4)
WBC: 3.9 10*3/uL (ref 3.4–10.8)

## 2024-03-26 LAB — B12 AND FOLATE PANEL
Folate: 3.9 ng/mL (ref >3.0)
Vitamin B-12: 430 pg/mL (ref 232–1245)

## 2024-03-26 LAB — HEMOGLOBIN A1C
Est. average glucose Bld gHb Est-mCnc: 105 mg/dL
Hgb A1c MFr Bld: 5.3 % (ref 4.8–5.6)

## 2024-03-26 LAB — LIPID PANEL
Cholesterol, Total: 219 mg/dL — ABNORMAL HIGH (ref 100–199)
HDL: 39 mg/dL — ABNORMAL LOW (ref >39)
VLDL Cholesterol Cal: 32 mg/dL (ref 5–40)

## 2024-03-26 LAB — VITAMIN D 25 HYDROXY (VIT D DEFICIENCY, FRACTURES): Vit D, 25-Hydroxy: 14.4 ng/mL — ABNORMAL LOW (ref 30.0–100.0)

## 2024-03-26 LAB — IRON,TIBC AND FERRITIN PANEL: Total Iron Binding Capacity: 224 ug/dL — ABNORMAL LOW (ref 250–450)

## 2024-03-26 LAB — TSH+FREE T4: TSH: 1.28 u[IU]/mL (ref 0.450–4.500)

## 2024-03-26 LAB — RHEUMATOID ARTHRITIS PROFILE: Cyclic Citrullin Peptide Ab: 60 U — ABNORMAL HIGH (ref 0–19)

## 2024-03-26 MED ORDER — VITAMIN D (ERGOCALCIFEROL) 1.25 MG (50000 UNIT) PO CAPS
50000.0000 [IU] | ORAL_CAPSULE | ORAL | 0 refills | Status: DC
  Filled 2024-03-26: qty 12, 84d supply, fill #0

## 2024-03-26 MED ORDER — WEGOVY 1.7 MG/0.75ML ~~LOC~~ SOAJ
1.7000 mg | SUBCUTANEOUS | 0 refills | Status: DC
  Filled 2024-03-26: qty 3, 28d supply, fill #0

## 2024-03-26 NOTE — Telephone Encounter (Signed)
 I sent wegovy to see if they can get that approved.

## 2024-03-26 NOTE — Telephone Encounter (Signed)
 Pharmacy Patient Advocate Encounter   Received notification from CoverMyMeds that prior authorization for Zepbound  10MG /0.5ML pen-injectors is required/requested.   Insurance verification completed.   The patient is insured through [facility] [place] .   Per test claim:  WEGOVY is preferred by the insurance.  If suggested medication is appropriate, Please send in a new RX and discontinue this one. If not, please advise as to why it's not appropriate so that we may request a Prior Authorization. Please note, some preferred medications may still require a PA.  If the suggested medications have not been trialed and there are no contraindications to their use, the PA will not be submitted, as it will not be approved.      [facility] Medicaid requires patient to have an inadequate response to [facility] after a 3 to 6 month therapeutic trial.      Key: [id number]

## 2024-03-26 NOTE — Telephone Encounter (Signed)
 Sent wegovy to replace zepbound .

## 2024-03-26 NOTE — Telephone Encounter (Signed)
 The Zepbound  rx requires a PA. Thanks in advance.

## 2024-03-26 NOTE — Addendum Note (Signed)
 Addended by: ANTONIETTE VERMELL CROME on: 03/26/2024 04:31 PM   Modules accepted: Orders

## 2024-03-26 NOTE — Progress Notes (Signed)
 Rheumatoid factor elevating for rheumatoid arthritis. Will send results to rheumatology. Take them the SLE paperwork as well.   Iron  stores and iron  look good but hemoglobin low. Likely due to chronic diseases.   Vitamin D  very low. Make sure taking weekly vitamin d  with diary for better absorption.   A1C looks great.  Thyroid  looks great.  B12 normal Folate low normal. I would start folic acid 400mcg daily.

## 2024-03-27 ENCOUNTER — Telehealth: Payer: Self-pay

## 2024-03-27 ENCOUNTER — Other Ambulatory Visit (HOSPITAL_COMMUNITY): Payer: Self-pay

## 2024-03-27 NOTE — Telephone Encounter (Signed)
 Last read by Meriam LITTIE Glatter at 11:42AM on 03/26/2024.

## 2024-03-27 NOTE — Telephone Encounter (Signed)
 Please review the previous TE from the provider. Thanks in advance.

## 2024-03-27 NOTE — Telephone Encounter (Signed)
 Pharmacy Patient Advocate Encounter   Received notification from CoverMyMeds that prior authorization for Wegovy 1.7MG /0.75ML auto-injectors is required/requested.   Insurance verification completed.   The patient is insured through [employer] .   Per test claim: PA required; PA submitted to above mentioned insurance via CoverMyMeds Key/confirmation #/EOC [id number] Status is pending

## 2024-03-28 NOTE — Telephone Encounter (Signed)
 Pharmacy Patient Advocate Encounter  Received notification from [facility] that Prior Authorization for [facility] 1.7MG /0.75ML auto-injectors  has been DENIED.  Full denial letter will be uploaded to the media tab. See denial reason below.   PA #/Case ID/Reference #: [id number]

## 2024-03-28 NOTE — Telephone Encounter (Signed)
Please notify patient of denial.

## 2024-03-29 ENCOUNTER — Other Ambulatory Visit (HOSPITAL_COMMUNITY): Payer: Self-pay

## 2024-04-01 ENCOUNTER — Other Ambulatory Visit (HOSPITAL_COMMUNITY): Payer: Self-pay

## 2024-04-01 ENCOUNTER — Ambulatory Visit (INDEPENDENT_AMBULATORY_CARE_PROVIDER_SITE_OTHER): Admitting: Clinical

## 2024-04-01 ENCOUNTER — Other Ambulatory Visit: Payer: Self-pay

## 2024-04-01 DIAGNOSIS — F4321 Adjustment disorder with depressed mood: Secondary | ICD-10-CM | POA: Diagnosis not present

## 2024-04-01 DIAGNOSIS — F411 Generalized anxiety disorder: Secondary | ICD-10-CM

## 2024-04-01 NOTE — Telephone Encounter (Signed)
 Message sent to patient with information from Prior auth team.

## 2024-04-01 NOTE — Progress Notes (Addendum)
 [facility] Counselor Initial Adult Exam  Name: Michelle Huffman Date: 04/01/2024 MRN: [id number] DOB: 10-20-1965 PCP: Antoniette Vermell LITTIE, PA-C  Time spent: 11:33am - 12:07pm   Guardian/Payee:  NA    Paperwork requested: NA  Reason for Visit /Presenting Problem: Patient stated, just been dealing with a lot, was out of work, was terminated wrongfully, since then my mind just hasn't been right. Patient reported patient was terminated from patient's job in June 2024. Patient stated, lost my mom last year, just found out that my dad has first stage dementia.   Mental Status Exam: Appearance:   Neat and Well Groomed     Behavior:  Appropriate  Motor:  Normal  Speech/Language:   Clear and Coherent and Normal Rate  Affect:  Flat  Mood:  Patient stated, feeling blah, I'm jest existing  Thought process:  normal  Thought content:    WNL  Sensory/Perceptual disturbances:    WNL  Orientation:  oriented to person, place, situation, day of week, month of year, year, and stated date of June 30th  Attention:  Good  Concentration:  Good  Memory:  WNL  Fund of knowledge:   Good  Insight:    Good  Judgment:   Good  Impulse Control:  Good   Reported Symptoms:  Patient reported depressed mood frequently, feeling angry frequently, still grieving a lot, difficulty falling asleep, decrease in energy, loss of interest, decreased concentration and motivation, stated I don't trust nobody now and reported feelings of betrayal. Patient reported depressive symptoms started over a year ago and patient stated, as soon as I transferred my job, right after I transferred it (symptoms) started. Patient reported anxiety, stated I start feeling antsy, I'm trying to think about doing too many things at one time, worry, irritability, feeling restless/on edge. Patient reported symptoms of anxiety have occurred for the past 2 years.   Risk Assessment: Danger to Self:  No Patient denied  current and past suicidal ideation and symptoms of psychosis.  Self-injurious Behavior: No Danger to Others: No Patient denied current and past homicidal ideation. Duty to Warn:no Physical Aggression / Violence:No  Access to Firearms a concern: No  Gang Involvement:No  Patient / guardian was educated about steps to take if suicide or homicide risk level increases between visits: yes While future psychiatric events cannot be accurately predicted, the patient does not currently require acute inpatient psychiatric care and does not currently meet [place]  involuntary commitment criteria.  Substance Abuse History: Current substance abuse: No   Patient stated, I'm a social drinker so that's once in a blue moon in reference to alcohol use. Patient reported no tobacco use in 17 years and reported tobacco use twice a week prior to 17 years ago. Patient reported no current or past drug use.   Past Psychiatric History:   Previous psychological history is significant for life stressors Outpatient Providers: history of individual therapy. Patient could not recall provider's name.  History of Psych Hospitalization: No  Psychological Testing: none   Abuse History:  Victim of: No., none   Report needed: No. Victim of Neglect:No. Perpetrator of none  Witness / Exposure to Domestic Violence: No   Protective Services Involvement: No  Witness to [employer] Violence:  No   Family History:  Family History  Problem Relation Age of Onset   Depression Mother    Hypertension Father    Hyperlipidemia Father    Asthma Other    Hypertension Other    Hyperlipidemia Other  Alcohol abuse Neg Hx    Anxiety disorder Neg Hx    Bipolar disorder Neg Hx    Dementia Neg Hx    Drug abuse Neg Hx    Schizophrenia Neg Hx     Living situation: the patient lives with their daughter  Sexual Orientation: Straight  Relationship Status: single  Name of spouse / other: NA If a parent, number of children /  ages: daughter age 35  Support Systems: best friend  Surveyor, quantity Stress:  Yes   Income/Employment/Disability: Employment  Financial planner: No   Educational History: Education: some college  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 History of legal issue / charges: none  Medical History/Surgical History: reviewed Past Medical History:  Diagnosis Date   Alpha thalassemia major (HCC)    Anemia    Arthritis    Asthma    Autoimmune disease (HCC)    Blood transfusion    Chest pain    Complication of anesthesia    Hypertension    Hypothyroidism    Lupus    PONV (postoperative nausea and vomiting)     Past Surgical History:  Procedure Laterality Date   ABDOMINAL HYSTERECTOMY  2009   CHOLECYSTECTOMY  07/21/11   IR RADIOLOGIST EVAL & MGMT  06/28/2023   IR RADIOLOGIST EVAL & MGMT  09/22/2023   KNEE SURGERY  2010   laproscopic right knee   LAPAROSCOPIC GASTRIC SLEEVE RESECTION N/A 08/19/2014   Procedure: LAPAROSCOPIC GASTRIC SLEEVE RESECTION;  Surgeon: Morene Olives, MD;  Location: [facility];  Service: General;  Laterality: N/A;   UPPER GI ENDOSCOPY  08/19/2014   Procedure: UPPER GI ENDOSCOPY;  Surgeon: Morene Olives, MD;  Location: [facility];  Service: General;;    Medications: Current Outpatient Medications  Medication Sig Dispense Refill   albuterol  (VENTOLIN  HFA) 108 (90 Base) MCG/ACT inhaler Inhale 2 puffs into the lungs every 6 (six) hours as needed for shortness of breath. 6.7 g 1   buPROPion  (WELLBUTRIN  SR) 150 MG  12 hr tablet Take 1 tablet (150 mg total) by mouth 2 (two) times daily. 180 tablet 1   cyclobenzaprine  (FLEXERIL ) 10 MG tablet Take 1 tablet (10 mg total) by mouth 3 (three) times daily as needed for muscle spasms. 30 tablet 0   hydroxychloroquine  (PLAQUENIL ) 200 MG tablet Take 1 tablet (200 mg total) by mouth 2 (two) times daily. 180 tablet 1   Iron -FA-B Cmp-C-Biot-Probiotic (FUSION PLUS) CAPS Take 1 capsule by mouth daily. 90 capsule 3   lisinopril -hydrochlorothiazide  (ZESTORETIC ) 10-12.5 MG tablet Take 1 tablet by mouth daily. 90 tablet 1   meloxicam  (MOBIC ) 15 MG tablet Take 1 tablet (15 mg total) by mouth daily. 90 tablet 1   metoprolol  tartrate (LOPRESSOR ) 25 MG tablet Take 25 mg by mouth daily as needed.     ondansetron  (ZOFRAN ) 4 MG tablet Take 1-2 tablets (4-8 mg total) by mouth every 8 (eight) hours as needed for nausea or vomiting. 12 tablet 0   rosuvastatin  (CRESTOR ) 10 MG tablet Take 1 tablet (10 mg total) by mouth daily. 90 tablet 3   sertraline  (ZOLOFT )  25 MG tablet Take 1 tablet (25 mg total) by mouth daily. 90 tablet 1   tirzepatide  (MOUNJARO ) 10 MG/0.5ML Pen Inject 10 mg into the skin once a week. 6 mL 1   tirzepatide  (ZEPBOUND ) 10 MG/0.5ML Pen Inject 10 mg into the skin once a week. 6 mL 1   traMADol  (ULTRAM ) 50 MG tablet Take 1 tablet (50 mg total) by mouth every 8 (eight) hours as needed for moderate pain (pain score 4-6). 21 tablet 0   traZODone  (DESYREL ) 50 MG tablet Take 1 tablet (50 mg total) by mouth at bedtime. TAKE 0.5-1 TABLETS BY MOUTH AT BEDTIME AS NEEDED FOR SLEEP. 90 tablet 1   Vitamin D , Ergocalciferol , (DRISDOL ) 1.25 MG (50000 UNIT) CAPS capsule Take 1 capsule (50,000 Units total) by mouth every 7 (seven) days. Take for 12 total doses(weeks) 12 capsule 0   WEGOVY  1.7 MG/0.75ML SOAJ Inject 1.7 mg into the skin once a week. Use this dose for 1 month (4 shots) and then increase to next higher dose. 3 mL 0   No current facility-administered medications for this visit.    Per patient 04/01/24 no longer taking mounjaro  and unsure which was approved wegovy  or zepbound  Allergies  Allergen Reactions   Sulfa Antibiotics Shortness Of Breath and Swelling    Diagnoses:  Adjustment disorder with depressed mood  Generalized anxiety disorder  Plan of Care: Patient is a 58 year old female who presented for an initial assessment. Clinician conducted initial assessment via caregility video for 34 minutes and for 17 minutes via phone from clinician's home office due to loss of video connection. Patient reported patient's phone lost video connection due to phone overheating. Patient provided verbal consent to proceed with telehealth session and is aware of limitations of telephone or video visits. Patient participated in session from patient's vehicle at patient's place of employment. Patient reported the following symptoms: depressed mood, anger, grief, difficulty falling asleep, decrease in energy, loss of interest, decreased concentration and motivation, anxiety, stated I start feeling antsy, I'm trying to think about doing too many things at one time, worry, irritability, feeling restless/on edge. Patient denied current and past suicidal ideation, homicidal ideation, and symptoms of psychosis. Patient reported alcohol use socially. Patient reported no tobacco use in 17 years. Patient reported no current or past drug use. Patient reported a history of participation in individual therapy. Patient reported no history of psychiatric hospitalizations. Patient reported finances, loss of patient's mother, and patient's father's health are current stressors. Patient identified patient's friend as a current support. It is recommended patient be referred to a psychiatrist for a medication management consult and recommended patient participate in individual therapy biweekly. Clinician will review recommendations and treatment plan with patient during follow up appointment. Treatment plan  will be developed during follow up appointment.   Collaboration of Care: Primary Care Provider AEB Patient requested to complete a consent for patient's PCP, Vermell Bologna, PA-C  Patient/Guardian was advised Release of Information must be obtained prior to any record release in order to collaborate their care with an outside provider. Patient/Guardian was advised if they have not already done so to contact [facility] to sign all necessary forms in order for us  to release information regarding their care.   Consent: Patient/Guardian gives verbal consent for treatment and assignment of benefits for services provided during this visit. Patient/Guardian expressed understanding and agreed to proceed.   Darice Seats, LCSW

## 2024-04-01 NOTE — Telephone Encounter (Signed)
 Hello Michelle Huffman,  Was the Wegovy  submitted with the patient's current weight or starter weight while on mounjaro ? She had to transition to Wegovy  as Mounjaro  was not longer a covered medciation under her plan. Can this be resubmitted with the starter weight while using Mounjaro ? Thank you so much for helping with this.

## 2024-04-01 NOTE — Telephone Encounter (Signed)
 Response sent to patient in another message.

## 2024-04-01 NOTE — Telephone Encounter (Signed)
 PA asks for baseline weight within the past 45 days.      I also added in the comments, that patient is transitioning from another GLP and starting BMI is 37.42. I also included chart notes with the starting weight/BMI and current weight/BMI.

## 2024-04-01 NOTE — Progress Notes (Unsigned)
   Michelle Barthel, LCSW

## 2024-04-02 NOTE — Telephone Encounter (Signed)
 Sent message to Vermell Bologna in separate message for review upon her return to the office on 04/09/2024

## 2024-04-11 NOTE — Telephone Encounter (Signed)
Responded to patient in separate message.

## 2024-04-13 DIAGNOSIS — Z419 Encounter for procedure for purposes other than remedying health state, unspecified: Secondary | ICD-10-CM | POA: Diagnosis not present

## 2024-04-17 MED ORDER — NALTREXONE-BUPROPION HCL ER 8-90 MG PO TB12: 2.0000 | ORAL_TABLET | Freq: Two times a day (BID) | ORAL | 2 refills | Status: DC

## 2024-04-17 MED ORDER — NALTREXONE-BUPROPION HCL ER 8-90 MG PO TB12: ORAL_TABLET | ORAL | 0 refills | Status: DC

## 2024-04-17 NOTE — Addendum Note (Signed)
 Addended by: ANTONIETTE VERMELL CROME on: 04/17/2024 12:41 PM   Modules accepted: Orders

## 2024-04-29 ENCOUNTER — Encounter: Payer: Self-pay | Admitting: Clinical

## 2024-04-29 ENCOUNTER — Ambulatory Visit (INDEPENDENT_AMBULATORY_CARE_PROVIDER_SITE_OTHER): Admitting: Clinical

## 2024-04-29 DIAGNOSIS — F4321 Adjustment disorder with depressed mood: Secondary | ICD-10-CM

## 2024-04-29 DIAGNOSIS — F411 Generalized anxiety disorder: Secondary | ICD-10-CM | POA: Diagnosis not present

## 2024-04-29 NOTE — Progress Notes (Signed)
 [facility] Counselor/Therapist Progress Note  Patient ID: Michelle Huffman, MRN: [id number]    Date: 04/29/24  Time Spent: 12:33  pm - 1:14 pm : 41 Minutes  Treatment Type: Individual Therapy.  Reported Symptoms: fatigue, depressed mood  Mental Status Exam: Appearance:  Neat and Well Groomed     Behavior: Appropriate  Motor: Normal  Speech/Language:  Clear and Coherent and Normal Rate  Affect: Flat  Mood: depressed  Thought process: normal  Thought content:   WNL  Sensory/Perceptual disturbances:   WNL  Orientation: oriented to person, place, time/date, and situation  Attention: Good  Concentration: Good  Memory: WNL  Fund of knowledge:  Good  Insight:   Good  Judgment:  Good  Impulse Control: Good   Risk Assessment: Danger to Self:  No Patient denied current suicidal ideation  Self-injurious Behavior: No Danger to Others: No Patient denied current homicidal ideation Duty to Warn:no Physical Aggression / Violence:No  Access to Firearms a concern: No  Gang Involvement:No   Subjective:  Patient stated, no change but I'm hanging there in response to events since last session. Patient stated, I don't think its been the best in response to patient's mood since last session. Patient reported feeling tired today. Patient reported no questions related to diagnoses. Patient stated, I think that will be a good thing in response to a consultation with a psychiatrist. Patient stated, I think that wouldn't be too bad in response to therapy. Patient stated, I need that so bad in response to participating in a grief/loss support group. Patient stated, I need it in response to a caregiver support group. Patient stated, I guess just being able to cope with it with out feeling anxiety and depressed. Patient stated, prayer and talking to positive people are current coping strategies patient utilizes. Patient stated, when you have the moment don't feel bad about  having and it cope in response to potential goals and grief.   Interventions: Motivational Interviewing. Clinician conducted session via caregility video from clinician's home office. Patient provided verbal consent to proceed with telehealth session and is aware of limitations of telephone or video visits. Patient participated in session from patient's place of employment. Clinician reviewed diagnoses and treatment recommendations. Provided psycho education related to diagnoses and treatment. Discussed participation in a grief/loss support group and caregiver support group for additional support. Clinician utilized motivational interviewing to explore potential goals for therapy. Clinician utilized a task centered approach in collaboration with patient to develop the following goals for therapy. Patient participated in development of the following goals and agreed to the following goals for therapy. Clinician requested for homework patient consider additional goals for therapy.   Collaboration of Care: Other Discussed consent required for referral to [employer]   Patient/Guardian was advised Release of Information must be obtained prior to any record release in order to collaborate their care with an outside provider. Patient/Guardian was advised if they have not already done so to contact [facility] to sign all necessary forms in order for us  to release information regarding their care.   Consent: Patient/Guardian gives verbal consent for treatment and assignment of benefits for services provided during this visit.  Patient/Guardian expressed understanding and agreed to proceed.   Diagnosis:  Adjustment disorder with depressed mood  Generalized anxiety disorder   Plan: Patient is to utilize [employer] Therapy, thought re-framing, relaxation techniques, mindfulness and coping strategies to decrease symptoms associated with their  diagnosis. Frequency: bi-weekly  Modality: individual     Long-term goal:   Reduce overall level, frequency, and intensity of the feelings of depression and anxiety as evidenced by decrease in depressed mood, anger, difficulty falling asleep, decrease in energy, loss of interest, decreased concentration and motivation, worry, irritability, feeling restless/on edge from 7 days/week to 0 to 1 days/week per patient report for at least 3 consecutive months. Target Date: 04/29/25  Progress: established 04/29/24   Short-term goal:  Increase using coping skills such as, prayer and talking to positive people, relaxation techniques, participation in positive activities, attend grief/loss support group and caregiver support group, gratitude exercises from 2 times per week to 6 to 7 times a day Target Date: 04/29/25  Progress: established 04/29/24   Practice coping strategies daily in response to feelings of grief  Target Date: 04/29/25  Progress: established 04/29/24                   Darice Seats, LCSW

## 2024-05-02 NOTE — Patient Instructions (Incomplete)
 Pap Test Why am I having this test? A Pap test, also called a Pap smear, is a screening test to check for signs of: Infection. Cancer of the cervix. The cervix is the lower part of the uterus that opens into the vagina. Changes that may be a sign that cancer is developing (precancerous changes). Women need this test on a regular basis. In general, you should have a Pap test every 3 years until you reach menopause or age 57. Women aged 30-60 may choose to have their Pap test done at the same time as an HPV (human papillomavirus) test every 5 years (instead of every 3 years). Your health care provider may recommend having Pap tests more or less often depending on your medical conditions and past Pap test results. What is being tested? Cervical cells are tested for signs of infection or abnormalities. What kind of sample is taken?  Your health care provider will collect a sample of cells from the surface of your cervix. This will be done using a small cotton swab, plastic spatula, or brush that is inserted into your vagina using a tool called a speculum. This sample is often collected during a pelvic exam, when you are lying on your back on an exam table with your feet in footrests (stirrups). In some cases, fluids (secretions) from the cervix or vagina may also be collected. How do I prepare for this test? Be aware of where you are in your menstrual cycle. If you are menstruating on the day of the test, you may be asked to reschedule. You may need to reschedule if you have a known vaginal infection on the day of the test. Follow instructions from your health care provider about: Changing or stopping your regular medicines. Some medicines can cause abnormal test results, such as vaginal medicines and tetracycline. Avoiding douching 2-3 days before or the day of the test. Tell a health care provider about: Any allergies you have. All medicines you are taking, including vitamins, herbs, eye drops,  creams, and over-the-counter medicines. Any bleeding problems you have. Any surgeries you have had. Any medical conditions you have. Whether you are pregnant or may be pregnant. How are the results reported? Your test results will be reported as either abnormal or normal. What do the results mean? A normal test result means that you do not have signs of cancer of the cervix. An abnormal result may mean that you have: Cancer. A Pap test by itself is not enough to diagnose cancer. You will have more tests done if cancer is suspected. Precancerous changes in your cervix. Inflammation of the cervix. An STI (sexually transmitted infection). A fungal infection. A parasite infection. Talk with your health care provider about what your results mean. In some cases, your health care provider may do more testing to confirm the results. Questions to ask your health care provider Ask your health care provider, or the department that is doing the test: When will my results be ready? How will I get my results? What are my treatment options? What other tests do I need? What are my next steps? Summary In general, women should have a Pap test every 3 years until they reach menopause or age 21. Your health care provider will collect a sample of cells from the surface of your cervix. This will be done using a small cotton swab, plastic spatula, or brush. In some cases, fluids (secretions) from the cervix or vagina may also be collected. This information is not  intended to replace advice given to you by your health care provider. Make sure you discuss any questions you have with your health care provider. Document Revised: 12/18/2020 Document Reviewed: 12/18/2020 Elsevier Patient Education  2025 [employer].

## 2024-05-03 ENCOUNTER — Encounter: Admitting: Obstetrics

## 2024-05-03 DIAGNOSIS — Z124 Encounter for screening for malignant neoplasm of cervix: Secondary | ICD-10-CM

## 2024-05-06 ENCOUNTER — Other Ambulatory Visit: Payer: Self-pay | Admitting: Physician Assistant

## 2024-05-07 MED ORDER — MOUNJARO 10 MG/0.5ML ~~LOC~~ SOAJ: 10.0000 mg | SUBCUTANEOUS | 1 refills | Status: DC

## 2024-05-07 NOTE — Addendum Note (Signed)
 Addended by: ANTONIETTE VERMELL CROME on: 05/07/2024 03:47 PM   Modules accepted: Orders

## 2024-05-14 DIAGNOSIS — Z419 Encounter for procedure for purposes other than remedying health state, unspecified: Secondary | ICD-10-CM | POA: Diagnosis not present

## 2024-05-15 NOTE — Telephone Encounter (Signed)
 Did you see the form she is talking about?

## 2024-05-17 ENCOUNTER — Telehealth: Payer: Self-pay

## 2024-05-17 ENCOUNTER — Ambulatory Visit: Admitting: Clinical

## 2024-05-17 NOTE — Telephone Encounter (Signed)
 We didn't receive a prior authorization form.

## 2024-05-17 NOTE — Telephone Encounter (Signed)
 Copied from CRM #[id number]. Topic: Clinical - Medication Prior Auth >> May 16, 2024  1:52 PM Merlynn A wrote: Reason for CRM: [facility] with [facility] called to request prior auth to be submitted for patient for medication tirzepatide  (MOUNJARO ) 10 MG/0.5ML Pen. Please submit PA for patient. Any questions please contact [phone] fax number [phone].

## 2024-05-21 DIAGNOSIS — Z0131 Encounter for examination of blood pressure with abnormal findings: Secondary | ICD-10-CM | POA: Diagnosis not present

## 2024-05-21 DIAGNOSIS — Z8659 Personal history of other mental and behavioral disorders: Secondary | ICD-10-CM | POA: Diagnosis not present

## 2024-05-21 DIAGNOSIS — I10 Essential (primary) hypertension: Secondary | ICD-10-CM | POA: Diagnosis not present

## 2024-05-21 DIAGNOSIS — Z1389 Encounter for screening for other disorder: Secondary | ICD-10-CM | POA: Diagnosis not present

## 2024-05-21 DIAGNOSIS — E785 Hyperlipidemia, unspecified: Secondary | ICD-10-CM | POA: Diagnosis not present

## 2024-05-21 DIAGNOSIS — L930 Discoid lupus erythematosus: Secondary | ICD-10-CM | POA: Diagnosis not present

## 2024-05-21 DIAGNOSIS — D649 Anemia, unspecified: Secondary | ICD-10-CM | POA: Diagnosis not present

## 2024-05-21 DIAGNOSIS — Z0000 Encounter for general adult medical examination without abnormal findings: Secondary | ICD-10-CM | POA: Diagnosis not present

## 2024-05-21 DIAGNOSIS — R7303 Prediabetes: Secondary | ICD-10-CM | POA: Diagnosis not present

## 2024-05-30 NOTE — Progress Notes (Deleted)
 ANNUAL PREVENTATIVE CARE GYNECOLOGY  ENCOUNTER NOTE  Subjective:       Michelle Huffman is a 58 y.o. No obstetric history on file. female here for a routine annual gynecologic exam. The patient {is/is not/has never been:13135} sexually active. The patient {is/is not:13135} taking hormone replacement therapy. {post-men bleed:13152::Patient denies post-menopausal vaginal bleeding.} The patient wears seatbelts: {yes/no:311178}. The patient participates in regular exercise: {yes/no/not asked:9010}. Has the patient ever been transfused or tattooed?: {yes/no/not asked:9010}. The patient reports that there {is/is not:9024} domestic violence in her life.  Current complaints: 1.  ***    Gynecologic History No LMP recorded. Patient has had a hysterectomy. Contraception: {method:5051} Last Pap: ***. Results were: {norm/abn:16337} Last mammogram: 12/06/2021. Results were: normal Last Colonoscopy:  Last Dexa Scan:    Obstetric History OB History  No obstetric history on file.    Past Medical History:  Diagnosis Date   Alpha thalassemia major (HCC)    Anemia    Arthritis    Asthma    Autoimmune disease (HCC)    Blood transfusion    Chest pain    Complication of anesthesia    Hypertension    Hypothyroidism    Lupus    PONV (postoperative nausea and vomiting)     Family History  Problem Relation Age of Onset   Depression Mother    Hypertension Father    Hyperlipidemia Father    Asthma Other    Hypertension Other    Hyperlipidemia Other    Alcohol abuse Neg Hx    Anxiety disorder Neg Hx    Bipolar disorder Neg Hx    Dementia Neg Hx    Drug abuse Neg Hx    Schizophrenia Neg Hx     Past Surgical History:  Procedure Laterality Date   CHOLECYSTECTOMY  07/21/2011   HYSTERECTOMY ABDOMINAL WITH SALPINGO-OOPHORECTOMY Right 09/02/2008   IR RADIOLOGIST EVAL & MGMT  06/28/2023   IR RADIOLOGIST EVAL & MGMT  09/22/2023   KNEE SURGERY  10/03/2008   laproscopic right knee    LAPAROSCOPIC GASTRIC SLEEVE RESECTION N/A 08/19/2014   Procedure: LAPAROSCOPIC GASTRIC SLEEVE RESECTION;  Surgeon: Morene Olives, MD;  Location: [facility];  Service: General;  Laterality: N/A;   UPPER GI ENDOSCOPY  08/19/2014   Procedure: UPPER GI ENDOSCOPY;  Surgeon: Morene Olives, MD;  Location: [facility];  Service: General;;    Social History   Socioeconomic History   Marital status: Single    Spouse name: Not on file   Number of children: 1   Years of education: 13   Highest education level: Some college, no degree  Occupational History  Tobacco Use   Smoking status: Former    Current packs/day: 0.00    Types: Cigarettes    Start date: 11/17/1999    Quit date: 11/17/2007    Years since quitting: 16.5   Smokeless tobacco: Never  Vaping Use   Vaping status: Never Used  Substance and Sexual Activity   Alcohol use: Yes    Comment: socially- a few times a year   Drug use: No   Sexual activity: Not on file  Other Topics Concern   Not on file  Social History Narrative   Pt lives in [place] with her 17yo daughter. Born and raised in [place] by her parents. Pt has 2 bro and pt is the youngest. States it was a good childhood. Pt has completed one year of college. 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 Pt has never been married.    Social Drivers of Health   Financial Resource Strain: Medium Risk (03/09/2023)   Overall Financial Resource Strain (CARDIA)    Difficulty of Paying Living Expenses: Somewhat hard  Food Insecurity: Food Insecurity Present (03/09/2023)   Hunger Vital Sign    Worried About Running Out of Food in the Last Year: Never true    Ran Out of Food in the Last Year: Sometimes true  Transportation Needs: No Transportation Needs (03/09/2023)   PRAPARE - Administrator, Civil Service (Medical): No    Lack of Transportation (Non-Medical): No  Physical Activity: Insufficiently Active (03/09/2023)   Exercise Vital Sign    Days of  Exercise per Week: 2 days    Minutes of Exercise per Session: 20 min  Stress: Stress Concern Present (03/09/2023)   [employer] of Occupational Health - Occupational Stress Questionnaire    Feeling of Stress : Rather much  Social Connections: Socially Isolated (03/09/2023)   Social Connection and Isolation Panel    Frequency of Communication with Friends and Family: Once a week    Frequency of Social Gatherings with Friends and Family: Once a week    Attends Religious Services: More than 4 times per year    Active Member of [employer] or Organizations: No    Attends Engineer, structural: Not on file    Marital Status: Never married  Intimate Partner Violence: Unknown (01/03/2022)   Received from [facility]   HITS    Physically Hurt: Not on file    Insult or Talk Down To: Not on file    Threaten Physical Harm: Not on file    Scream or Curse: Not on file    Current Outpatient Medications on File Prior to Visit  Medication Sig Dispense Refill   albuterol  (VENTOLIN  HFA) 108 (90 Base) MCG/ACT inhaler Inhale 2 puffs into the lungs every 6 (six) hours as needed for shortness of breath. 6.7 g 1   buPROPion  (WELLBUTRIN  SR) 150 MG 12 hr tablet Take 1 tablet (150 mg total) by mouth 2 (two) times daily. 180 tablet 1   cyclobenzaprine  (FLEXERIL ) 10 MG tablet Take 1 tablet (10 mg total) by mouth 3 (three) times daily as needed for muscle spasms. 30 tablet 0   hydroxychloroquine  (PLAQUENIL ) 200 MG tablet Take 1 tablet (200 mg total) by mouth 2 (two) times daily. 180 tablet 1   Iron -FA-B Cmp-C-Biot-Probiotic (FUSION PLUS) CAPS Take 1 capsule by mouth daily. 90 capsule 3   lisinopril -hydrochlorothiazide  (ZESTORETIC ) 10-12.5 MG tablet Take 1 tablet by mouth daily. 90 tablet 1   meloxicam  (MOBIC ) 15 MG tablet Take 1 tablet (15 mg total) by mouth daily. 90 tablet 1   metoprolol  tartrate (LOPRESSOR ) 25 MG tablet Take 25 mg by mouth daily as needed.     Naltrexone -buPROPion  HCl ER 8-90 MG TB12 1 tab  daily for week 1, then 1 tab BID for week 2, then 2 tab PO qAM and 1 tab PO qPM for week 3, then 2 tabs BID. 80 tablet 0   Naltrexone -buPROPion  HCl ER 8-90 MG TB12 Take 2 tablets by mouth 2 (two) times daily. 120 tablet 2   ondansetron  (ZOFRAN ) 4 MG tablet Take 1-2 tablets (4-8 mg total) by mouth every 8 (eight) hours as needed for nausea or vomiting. 12 tablet 0   rosuvastatin  (CRESTOR ) 10 MG tablet Take 1 tablet (10 mg total) by mouth daily. 90 tablet 3   sertraline  (ZOLOFT ) 25 MG  tablet Take 1 tablet (25 mg total) by mouth daily. 90 tablet 1   tirzepatide  (MOUNJARO ) 10 MG/0.5ML Pen Inject 10 mg into the skin once a week. 6 mL 1   tirzepatide  (ZEPBOUND ) 10 MG/0.5ML Pen Inject 10 mg into the skin once a week. 6 mL 1   traMADol  (ULTRAM ) 50 MG tablet Take 1 tablet (50 mg total) by mouth every 8 (eight) hours as needed for moderate pain (pain score 4-6). 21 tablet 0   traZODone  (DESYREL ) 50 MG tablet Take 1 tablet (50 mg total) by mouth at bedtime. TAKE 0.5-1 TABLETS BY MOUTH AT BEDTIME AS NEEDED FOR SLEEP. 90 tablet 1   Vitamin D , Ergocalciferol , (DRISDOL ) 1.25 MG (50000 UNIT) CAPS capsule Take 1 capsule (50,000 Units total) by mouth every 7 (seven) days. Take for 12 total doses(weeks) 12 capsule 0   WEGOVY  1.7 MG/0.75ML SOAJ Inject 1.7 mg into the skin once a week. Use this dose for 1 month (4 shots) and then increase to next higher dose. 3 mL 0   No current facility-administered medications on file prior to visit.    Allergies  Allergen Reactions   Sulfa Antibiotics Shortness Of Breath and Swelling      Review of Systems ROS Review of Systems - General ROS: negative for - chills, fatigue, fever, hot flashes, night sweats, weight gain or weight loss Psychological ROS: negative for - anxiety, decreased libido, depression, mood swings, physical abuse or sexual abuse Ophthalmic ROS: negative for - blurry vision, eye pain or loss of vision ENT ROS: negative for - headaches, hearing change,  visual changes or vocal changes Allergy and Immunology ROS: negative for - hives, itchy/watery eyes or seasonal allergies Hematological and Lymphatic ROS: negative for - bleeding problems, bruising, swollen lymph nodes or weight loss Endocrine ROS: negative for - galactorrhea, hair pattern changes, hot flashes, malaise/lethargy, mood swings, palpitations, polydipsia/polyuria, skin changes, temperature intolerance or unexpected weight changes Breast ROS: negative for - new or changing breast lumps or nipple discharge Respiratory ROS: negative for - cough or shortness of breath Cardiovascular ROS: negative for - chest pain, irregular heartbeat, palpitations or shortness of breath Gastrointestinal ROS: no abdominal pain, change in bowel habits, or black or bloody stools Genito-Urinary ROS: no dysuria, trouble voiding, or hematuria Musculoskeletal ROS: negative for - joint pain or joint stiffness Neurological ROS: negative for - bowel and bladder control changes Dermatological ROS: negative for rash and skin lesion changes   Objective:   There were no vitals taken for this visit. CONSTITUTIONAL: Well-developed, well-nourished female in no acute distress.  PSYCHIATRIC: Normal mood and affect. Normal behavior. Normal judgment and thought content. NEUROLGIC: Alert and oriented to person, place, and time. Normal muscle tone coordination. No cranial nerve deficit noted. HENT:  Normocephalic, atraumatic, External right and left ear normal. Oropharynx is clear and moist EYES: Conjunctivae and EOM are normal. Pupils are equal, round, and reactive to light. No scleral icterus.  NECK: Normal range of motion, supple, no masses.  Normal thyroid .  SKIN: Skin is warm and dry. No rash noted. Not diaphoretic. No erythema. No pallor. CARDIOVASCULAR: Normal heart rate noted, regular rhythm, no murmur. RESPIRATORY: Clear to auscultation bilaterally. Effort and breath sounds normal, no problems with respiration  noted. BREASTS: Symmetric in size. No masses, skin changes, nipple drainage, or lymphadenopathy. ABDOMEN: Soft, normal bowel sounds, no distention noted.  No tenderness, rebound or guarding.  BLADDER: Normal PELVIC:  Bladder {:311640}  Urethra: {:311719}  Vulva: {:311722}  Vagina: {:311643}  Cervix: {:311644}  Uterus: {:311718}  Adnexa: {:311645}  RV: {Blank multiple:19196::External Exam NormaI,No Rectal Masses,Normal Sphincter tone}  MUSCULOSKELETAL: Normal range of motion. No tenderness.  No cyanosis, clubbing, or edema.  2+ distal pulses. LYMPHATIC: No Axillary, Supraclavicular, or Inguinal Adenopathy.   Labs: Lab Results  Component Value Date   WBC 3.9 03/21/2024   HGB 9.0 (L) 03/21/2024   HCT 30.4 (L) 03/21/2024   MCV 72 (L) 03/21/2024   PLT 256 03/21/2024    Lab Results  Component Value Date   CREATININE 0.98 03/21/2024   BUN 15 03/21/2024   NA 144 03/21/2024   K 3.9 03/21/2024   CL 109 (H) 03/21/2024   CO2 22 03/21/2024    Lab Results  Component Value Date   ALT 7 03/21/2024   AST 15 03/21/2024   ALKPHOS 77 03/21/2024   BILITOT 0.3 03/21/2024    Lab Results  Component Value Date   CHOL 219 (H) 03/21/2024   HDL 39 (L) 03/21/2024   LDLCALC 148 (H) 03/21/2024   TRIG 175 (H) 03/21/2024   CHOLHDL 5.6 (H) 03/21/2024    Lab Results  Component Value Date   TSH 1.280 03/21/2024    Lab Results  Component Value Date   HGBA1C 5.3 03/21/2024     Assessment:   No diagnosis found.   Plan:  Pap: Pap, Reflex if ASCUS Mammogram: Ordered Colon Screening:  *** Labs: {Blank multiple:19196::Lipid 1,FBS,TSH,Hemoglobin A1C,Vit D Level***} Routine preventative health maintenance measures emphasized: {Blank multiple:19196::Exercise/Diet/Weight control,Tobacco Warnings,Alcohol/Substance use risks,Stress Management,Peer Pressure Issues,Safe Sex} Flu vaccine status: COVID Vaccination status: Return to Clinic - 1 Year     [employer], CNM [facility]

## 2024-05-30 NOTE — Patient Instructions (Incomplete)
 Preventive Care 66-57 Years Old, Female Preventive care refers to lifestyle choices and visits with your health care provider that can promote health and wellness. Preventive care visits are also called wellness exams. What can I expect for my preventive care visit? Counseling Your health care provider may ask you questions about your: Medical history, including: Past medical problems. Family medical history. Pregnancy history. Current health, including: Menstrual cycle. Method of birth control. Emotional well-being. Home life and relationship well-being. Sexual activity and sexual health. Lifestyle, including: Alcohol, nicotine or tobacco, and drug use. Access to firearms. Diet, exercise, and sleep habits. Work and work Astronomer. Sunscreen use. Safety issues such as seatbelt and bike helmet use. Physical exam Your health care provider will check your: Height and weight. These may be used to calculate your BMI (body mass index). BMI is a measurement that tells if you are at a healthy weight. Waist circumference. This measures the distance around your waistline. This measurement also tells if you are at a healthy weight and may help predict your risk of certain diseases, such as type 2 diabetes and high blood pressure. Heart rate and blood pressure. Body temperature. Skin for abnormal spots. What immunizations do I need?  Vaccines are usually given at various ages, according to a schedule. Your health care provider will recommend vaccines for you based on your age, medical history, and lifestyle or other factors, such as travel or where you work. What tests do I need? Screening Your health care provider may recommend screening tests for certain conditions. This may include: Lipid and cholesterol levels. Diabetes screening. This is done by checking your blood sugar (glucose) after you have not eaten for a while (fasting). Pelvic exam and Pap test. Hepatitis B test. Hepatitis C  test. HIV (human immunodeficiency virus) test. STI (sexually transmitted infection) testing, if you are at risk. Lung cancer screening. Colorectal cancer screening. Mammogram. Talk with your health care provider about when you should start having regular mammograms. This may depend on whether you have a family history of breast cancer. BRCA-related cancer screening. This may be done if you have a family history of breast, ovarian, tubal, or peritoneal cancers. Bone density scan. This is done to screen for osteoporosis. Talk with your health care provider about your test results, treatment options, and if necessary, the need for more tests. Follow these instructions at home: Eating and drinking  Eat a diet that includes fresh fruits and vegetables, whole grains, lean protein, and low-fat dairy products. Take vitamin and mineral supplements as recommended by your health care provider. Do not drink alcohol if: Your health care provider tells you not to drink. You are pregnant, may be pregnant, or are planning to become pregnant. If you drink alcohol: Limit how much you have to 0-1 drink a day. Know how much alcohol is in your drink. In the [place] one drink equals one 12 oz bottle of beer (355 mL), one 5 oz glass of wine (148 mL), or one 1 oz glass of hard liquor (44 mL). Lifestyle Brush your teeth every morning and night with fluoride toothpaste. Floss one time each day. Exercise for at least 30 minutes 5 or more days each week. Do not use any products that contain nicotine or tobacco. These products include cigarettes, chewing tobacco, and vaping devices, such as e-cigarettes. If you need help quitting, ask your health care provider. Do not use drugs. If you are sexually active, practice safe sex. Use a condom or other form of protection to  prevent STIs. If you do not wish to become pregnant, use a form of birth control. If you plan to become pregnant, see your health care provider for a  prepregnancy visit. Take aspirin only as told by your health care provider. Make sure that you understand how much to take and what form to take. Work with your health care provider to find out whether it is safe and beneficial for you to take aspirin daily. Find healthy ways to manage stress, such as: Meditation, yoga, or listening to music. Journaling. Talking to a trusted person. Spending time with friends and family. Minimize exposure to UV radiation to reduce your risk of skin cancer. Safety Always wear your seat belt while driving or riding in a vehicle. Do not drive: If you have been drinking alcohol. Do not ride with someone who has been drinking. When you are tired or distracted. While texting. If you have been using any mind-altering substances or drugs. Wear a helmet and other protective equipment during sports activities. If you have firearms in your house, make sure you follow all gun safety procedures. Seek help if you have been physically or sexually abused. What's next? Visit your health care provider once a year for an annual wellness visit. Ask your health care provider how often you should have your eyes and teeth checked. Stay up to date on all vaccines. This information is not intended to replace advice given to you by your health care provider. Make sure you discuss any questions you have with your health care provider. Document Revised: 03/17/2021 Document Reviewed: 03/17/2021 Elsevier Patient Education  2024 Else[employer] Inc. How to Do a Breast Self-Exam Doing breast self-exams can help you stay healthy. They're one way to know what's normal for your breasts. They can help you catch a problem while it's still small and can be treated. You need to: Check your breasts often. Tell your doctor about any changes. You should do breast self-exams even if you have breast implants. What you need: A mirror. A well-lit room. A pillow or other soft object. How to do a  breast self-exam Look for changes  Take off all the clothes above your waist. Stand in front of a mirror in a room with good lighting. Put your hands down at your sides. Compare your breasts in the mirror. Look for difference between them, such as: Differences in shape. Differences in size. Wrinkles, dips, and bumps in one breast and not the other. Look at each breast for skin changes, such as: Redness. Scaly spots. Spots where your skin is thicker. Dimpling. Open sores. Look for changes in your nipples, such as: Fluid coming out of a nipple. Fluid around a nipple. Bleeding. Dimpling. Redness. A nipple that looks pushed in or that has changed position. Feel for changes Lie on your back. Feel each breast. To do this: Pick a breast to feel. Place a pillow under the shoulder closest to that breast. Put the arm closest to that breast behind your head. Feel the breast using the hand of your other arm. Use the pads of your three middle fingers to make small circles starting near the nipple. Use light, medium, and firm pressure. Keep making circles, moving down over the breast. Stop when you feel your ribs. Start making circles with your fingers again, this time going up until you reach your collarbone. Then, make circles out across your breast and into your armpit area. Squeeze your nipple. Check for fluid and lumps. Do these steps again  to check your other breast. Sit or stand in the tub or shower. With soapy water on your skin, feel each breast the same way you did when you were lying down. Write down what you find Writing down what you find can help you keep track of what you want to tell your doctor. Write down: What's normal for each breast. Any changes you find. Write down: The kind of change. If your breast feels tender or painful. Any lump you find. Write down its size and where it is. When you last had your period. General tips If you're breastfeeding, the best time  to check your breasts is after you feed your baby or after you use a breast pump. If you get a period, the best time to check your breasts is 5-7 days after your period ends. With time, you'll get more used to doing the self-exam. You'll also start to know if there are changes in your breasts. Contact a doctor if: You see a change in the shape or size of your breasts or nipples. You see a change in the skin of your breast or nipples. You have fluid coming from your nipples that isn't normal. You find a new lump or thick area. You have breast pain. You have any concerns about your breast health. This information is not intended to replace advice given to you by your health care provider. Make sure you discuss any questions you have with your health care provider. Document Revised: 11/29/2023 Document Reviewed: 11/29/2023 Elsevier Patient Education  2025 [employer].

## 2024-06-02 ENCOUNTER — Encounter: Payer: Self-pay | Admitting: Physician Assistant

## 2024-06-02 DIAGNOSIS — R7303 Prediabetes: Secondary | ICD-10-CM

## 2024-06-02 DIAGNOSIS — E039 Hypothyroidism, unspecified: Secondary | ICD-10-CM

## 2024-06-02 DIAGNOSIS — E7800 Pure hypercholesterolemia, unspecified: Secondary | ICD-10-CM

## 2024-06-02 DIAGNOSIS — E66812 Obesity, class 2: Secondary | ICD-10-CM

## 2024-06-02 DIAGNOSIS — M0579 Rheumatoid arthritis with rheumatoid factor of multiple sites without organ or systems involvement: Secondary | ICD-10-CM

## 2024-06-02 DIAGNOSIS — M329 Systemic lupus erythematosus, unspecified: Secondary | ICD-10-CM

## 2024-06-04 ENCOUNTER — Encounter: Payer: Self-pay | Admitting: Sports Medicine

## 2024-06-04 ENCOUNTER — Encounter: Admitting: Certified Nurse Midwife

## 2024-06-04 DIAGNOSIS — Z1211 Encounter for screening for malignant neoplasm of colon: Secondary | ICD-10-CM

## 2024-06-04 DIAGNOSIS — Z1231 Encounter for screening mammogram for malignant neoplasm of breast: Secondary | ICD-10-CM

## 2024-06-04 DIAGNOSIS — Z01419 Encounter for gynecological examination (general) (routine) without abnormal findings: Secondary | ICD-10-CM

## 2024-06-04 DIAGNOSIS — Z124 Encounter for screening for malignant neoplasm of cervix: Secondary | ICD-10-CM

## 2024-06-10 ENCOUNTER — Other Ambulatory Visit (HOSPITAL_COMMUNITY): Payer: Self-pay

## 2024-06-14 DIAGNOSIS — Z419 Encounter for procedure for purposes other than remedying health state, unspecified: Secondary | ICD-10-CM | POA: Diagnosis not present

## 2024-06-27 ENCOUNTER — Ambulatory Visit (INDEPENDENT_AMBULATORY_CARE_PROVIDER_SITE_OTHER): Admitting: Urgent Care

## 2024-06-27 ENCOUNTER — Encounter: Payer: Self-pay | Admitting: Urgent Care

## 2024-06-27 VITALS — BP 134/84 | HR 61 | Ht 64.0 in | Wt 181.0 lb

## 2024-06-27 DIAGNOSIS — D509 Iron deficiency anemia, unspecified: Secondary | ICD-10-CM

## 2024-06-27 DIAGNOSIS — F411 Generalized anxiety disorder: Secondary | ICD-10-CM | POA: Diagnosis not present

## 2024-06-27 DIAGNOSIS — G629 Polyneuropathy, unspecified: Secondary | ICD-10-CM

## 2024-06-27 MED ORDER — DULOXETINE HCL 30 MG PO CPEP: 30.0000 mg | ORAL_CAPSULE | Freq: Every day | ORAL | 3 refills | Status: DC

## 2024-06-27 NOTE — Progress Notes (Signed)
 Established Patient Office Visit  Subjective:  Patient ID: Michelle Huffman, female    DOB: [birth year]  Age: 58 y.o. MRN: [id number]  Chief Complaint  Patient presents with   Stress   toe tingling    Bilateral x months     HPI  Discussed the use of AI scribe software for clinical note transcription with the patient, who gave verbal consent to proceed.  History of Present Illness   Michelle Huffman is a 58 year old female with lupus who presents with numbness in her toes.  She experiences numbness in her toes, described as a loss of sensation, occurring in both feet, specifically in the toes. The symptoms are inconsistent but tend to worsen during periods of increased stress, which she associates with flare-ups of her lupus. She also experiences similar symptoms in her hands, where her fingertips turn purple, attributed to Raynaud's phenomenon.  She has a history of prediabetes and was previously on Mounjaro , which helped with her weight and blood pressure. However, she had to discontinue it due to insurance issues. Her hemoglobin A1c improved from 6.3% to 5.3% while on the medication.  She is experiencing significant stress due to personal circumstances, including the recent loss of her mother and her father's diagnosis of dementia. She is currently on Wellbutrin  and a low dose of Zoloft  for stress, but reports that the Zoloft  has not been effective.  She has a history of anemia, which has not been fully worked up. She is not iron  deficient. She is not currently on any specific medication for pain but has used tramadol  inconsistently. She also has trazodone  prescribed for sleep but has not been using it regularly.       Patient Active Problem List   Diagnosis Date Noted   Low hemoglobin 03/26/2024   Vitamin D  deficiency 03/22/2024   Cold intolerance of hand 03/11/2024   Bilateral hand pain 03/08/2024   Positive TB test 06/14/2023   Overweight (BMI 25.0-29.9) 06/14/2023    Lumbar degenerative disc disease 01/10/2023   DDD (degenerative disc disease), thoracic 09/14/2022   Mid back pain 09/13/2022   Elevated LDL cholesterol level 08/17/2022   Pre-diabetes 08/17/2022   Class 2 obesity due to excess calories without serious comorbidity with body mass index (BMI) of 37.0 to 37.9 in adult 08/16/2022   S/P laparoscopic sleeve gastrectomy 08/16/2022   Primary hypertension 08/16/2022   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   Microcytic anemia 08/16/2022   Systemic lupus erythematosus (HCC) 07/20/2022   Plantar fascial fibromatosis of right foot 07/20/2022   Primary osteoarthritis of right knee 07/20/2022   Mixed hyperlipidemia 03/31/2021   Iron  deficiency 01/14/2016   Insomnia 11/17/2015   GAD (generalized anxiety disorder) 11/17/2015   Severe episode of recurrent major depressive disorder, without psychotic features (HCC) 11/17/2015   Alpha thalassemia trait 03/31/2015   Morbid obesity (HCC) 03/27/2014   Right shoulder pain 03/06/2014   Rheumatoid arthritis (HCC) 10/29/2013   ANA positive 10/17/2013   Asthma 10/17/2013   Elevated sed rate 10/17/2013   Recurrent bronchospasm 07/23/2013   Biliary dyskinesia 07/11/2011   PAROTIDITIS 10/11/2007   PITUITARY ADENOMA, BENIGN 07/25/2007   Hypothyroidism 05/03/2007   ANEMIA-IRON  DEFICIENCY 05/03/2007   Past Medical History:  Diagnosis Date   Alpha thalassemia major    Anemia    Arthritis    Asthma    Autoimmune disease    Blood transfusion    Chest pain    Complication of anesthesia    Hypertension  Hypothyroidism    Lupus    PONV (postoperative nausea and vomiting)    Past Surgical History:  Procedure Laterality Date   CHOLECYSTECTOMY  07/21/2011   HYSTERECTOMY ABDOMINAL WITH SALPINGO-OOPHORECTOMY Right 09/02/2008   IR RADIOLOGIST EVAL & MGMT  06/28/2023   IR RADIOLOGIST EVAL & MGMT  09/22/2023   KNEE SURGERY  10/03/2008   laproscopic right knee   LAPAROSCOPIC GASTRIC SLEEVE RESECTION N/A 08/19/2014    Procedure: LAPAROSCOPIC GASTRIC SLEEVE RESECTION;  Surgeon: Morene Olives, MD;  Location: [facility];  Service: General;  Laterality: N/A;   UPPER GI ENDOSCOPY  08/19/2014   Procedure: UPPER GI ENDOSCOPY;  Surgeon: Morene Olives, MD;  Location: [facility];  Service: General;;   Social History   Tobacco Use   Smoking status: Former    Current packs/day: 0.00    Types: Cigarettes    Start date: 11/17/1999    Quit date: 11/17/2007    Years since quitting: 16.6   Smokeless tobacco: Never  Vaping Use   Vaping status: Never Used  Substance Use Topics   Alcohol use: Yes    Comment: socially- a few times a year   Drug use: No      ROS: as noted in HPI  Objective:     BP 134/84   Pulse 61   Ht 5' 4 (1.626 m)   Wt 181 lb (82.1 kg)   SpO2 100%   BMI 31.07 kg/m  BP Readings from Last 3 Encounters:  06/27/24 134/84  03/08/24 130/80  08/03/23 (!) 139/92   Wt Readings from Last 3 Encounters:  06/27/24 181 lb (82.1 kg)  03/08/24 156 lb (70.8 kg)  06/14/23 169 lb (76.7 kg)      Physical Exam Vitals and nursing note reviewed.  Constitutional:      Appearance: Normal appearance. She is not ill-appearing, toxic-appearing or diaphoretic.  Eyes:     General: No scleral icterus.       Right eye: No discharge.        Left eye: No discharge.  Cardiovascular:     Rate and Rhythm: Normal rate and regular rhythm.  Pulmonary:     Effort: Pulmonary effort is normal. No respiratory distress.     Breath sounds: Normal breath sounds. No stridor. No wheezing or rhonchi.  Musculoskeletal:     Cervical back: No rigidity.     Right lower leg: No edema.     Left lower leg: No edema.  Lymphadenopathy:     Cervical: No cervical adenopathy.  Skin:    General: Skin is warm and dry.     Coloration: Skin is not jaundiced.     Findings: No bruising or erythema.  Neurological:     General: No focal deficit present.     Mental Status: She is alert and oriented to person, place, and time.      Sensory: No sensory deficit.     Motor: No weakness.     Gait: Gait normal.       06/27/2024   12:09 PM 03/11/2024    7:25 AM 08/18/2022   11:41 AM  Depression screen PHQ 2/9  Decreased Interest 3 0 0  Down, Depressed, Hopeless 3 1 1   PHQ - 2 Score 6 1 1   Altered sleeping 3 0 0  Tired, decreased energy 2 1 1   Change in appetite 2 0 0  Feeling bad or failure about yourself  3 0 1  Trouble concentrating 1 0 0  Moving slowly or fidgety/restless  0 0 0  Suicidal thoughts 1 0 0  PHQ-9 Score 18 2 3   Difficult doing work/chores Very difficult  Not difficult at all      06/27/2024   12:10 PM 03/11/2024    7:25 AM 08/18/2022   11:41 AM  GAD 7 : Generalized Anxiety Score  Nervous, Anxious, on Edge 0 1 0  Control/stop worrying 3 0 1  Worry too much - different things 3 1 1   Trouble relaxing 3 0 1  Restless 1 0 0  Easily annoyed or irritable 3 1 0  Afraid - awful might happen 0 0 0  Total GAD 7 Score 13 3 3   Anxiety Difficulty Very difficult Not difficult at all Not difficult at all        No results found for any visits on 06/27/24.   The 10-year ASCVD risk score (Arnett DK, et al., 2019) is: 9%  Assessment & Plan:  Microcytic anemia -     CBC with Differential/Platelet -     Hgb Fractionation Cascade -     Comprehensive metabolic panel with GFR -     Glucose 6 phosphate dehydrogenase -     ANCA Profile  Neuropathy -     CBC with Differential/Platelet -     Hgb Fractionation Cascade -     Vitamin B6 -     ANCA screen with reflex titer -     Comprehensive metabolic panel with GFR -     Vitamin B1 -     Vitamin B12 -     Zinc  -     Glucose 6 phosphate dehydrogenase -     DULoxetine  HCl; Take 1 capsule (30 mg total) by mouth daily.  Dispense: 30 capsule; Refill: 3 -     ANCA Profile  GAD (generalized anxiety disorder) -     DULoxetine  HCl; Take 1 capsule (30 mg total) by mouth daily.  Dispense: 30 capsule; Refill: 3  Assessment and Plan    Systemic lupus  erythematosus (SLE) Intermittent numbness in toes, possibly related to lupus flare-ups, exacerbated by stress.  Peripheral neuropathy involving toes and fingers Loss of sensation in toes and occasional purple discoloration of fingertips, possibly related to stress and chronic anemia.  - Order blood tests to evaluate for hemolytic anemia. - Switch sertraline  to Cymbalta  to address neuropathy symptoms.  Chronic anemia, not iron  deficient Chronic anemia with indices of microcytosis. Potential link to peripheral neuropathy symptoms. - Order blood tests to evaluate for hemolytic anemia vs thalasemia  Major depressive disorder and stress Increased stress due to personal circumstances. Current treatment with sertraline  and Wellbutrin  is not effective. - Start Cymbalta . - Continue sertraline  for two weeks, then discontinue. - Evaluate effectiveness of Cymbalta  for depression and stress in conjunction with neuropathy symptoms.         Return in about 4 weeks (around 07/25/2024).   Benton LITTIE Gave, PA

## 2024-06-27 NOTE — Patient Instructions (Addendum)
 Start cymbalta  today, continue your sertraline  for the next 2 weeks, then stop.  Please return for follow up or recheck in 4 weeks.  We will contact you via mychart once all your lab results come in which may take several days.

## 2024-07-02 ENCOUNTER — Ambulatory Visit: Payer: Self-pay | Admitting: Urgent Care

## 2024-07-02 ENCOUNTER — Other Ambulatory Visit (HOSPITAL_COMMUNITY): Payer: Self-pay

## 2024-07-02 DIAGNOSIS — E5111 Dry beriberi: Secondary | ICD-10-CM

## 2024-07-02 LAB — ANCA PROFILE
Anti-MPO Antibodies: <0.2 U (ref 0.0–0.9)
Anti-PR3 Antibodies: <0.2 U (ref 0.0–0.9)
Atypical pANCA: <1:20 {titer}
C-ANCA: <1:20 {titer}
P-ANCA: <1:20 {titer}

## 2024-07-04 ENCOUNTER — Other Ambulatory Visit (HOSPITAL_BASED_OUTPATIENT_CLINIC_OR_DEPARTMENT_OTHER): Payer: Self-pay

## 2024-07-04 LAB — COMPREHENSIVE METABOLIC PANEL WITH GFR
ALT: 7 IU/L (ref 0–32)
AST: 15 IU/L (ref 0–40)
Albumin: 4.1 g/dL (ref 3.8–4.9)
Alkaline Phosphatase: 87 IU/L (ref 49–135)
BUN/Creatinine Ratio: 19 (ref 9–23)
BUN: 16 mg/dL (ref 6–24)
Bilirubin Total: 0.3 mg/dL (ref 0.0–1.2)
CO2: 22 mmol/L (ref 20–29)
Calcium: 9.3 mg/dL (ref 8.7–10.2)
Chloride: 105 mmol/L (ref 96–106)
Creatinine, Ser: 0.85 mg/dL (ref 0.57–1.00)
Globulin, Total: 3.6 g/dL (ref 1.5–4.5)
Glucose: 73 mg/dL (ref 70–99)
Potassium: 4.4 mmol/L (ref 3.5–5.2)
Sodium: 143 mmol/L (ref 134–144)
Total Protein: 7.7 g/dL (ref 6.0–8.5)
eGFR: 80 mL/min/1.73 (ref >59)

## 2024-07-04 LAB — CBC WITH DIFFERENTIAL/PLATELET
Basophils Absolute: 0 x10E3/uL (ref 0.0–0.2)
Basos: 0 %
EOS (ABSOLUTE): 0.1 x10E3/uL (ref 0.0–0.4)
Eos: 3 %
Hematocrit: 30.6 % — ABNORMAL LOW (ref 34.0–46.6)
Hemoglobin: 9 g/dL — ABNORMAL LOW (ref 11.1–15.9)
Immature Grans (Abs): 0 x10E3/uL (ref 0.0–0.1)
Immature Granulocytes: 0 %
Lymphocytes Absolute: 0.9 x10E3/uL (ref 0.7–3.1)
Lymphs: 25 %
MCH: 21.3 pg — ABNORMAL LOW (ref 26.6–33.0)
MCHC: 29.4 g/dL — ABNORMAL LOW (ref 31.5–35.7)
MCV: 73 fL — ABNORMAL LOW (ref 79–97)
Monocytes Absolute: 0.3 x10E3/uL (ref 0.1–0.9)
Monocytes: 8 %
Neutrophils Absolute: 2.3 x10E3/uL (ref 1.4–7.0)
Neutrophils: 64 %
Platelets: 212 x10E3/uL (ref 150–450)
RBC: 4.22 x10E6/uL (ref 3.77–5.28)
RDW: 18 % — ABNORMAL HIGH (ref 11.7–15.4)
WBC: 3.7 x10E3/uL (ref 3.4–10.8)

## 2024-07-04 LAB — GLUCOSE 6 PHOSPHATE DEHYDROGENASE: G-6-PD, Quant: 11.3 U/g{Hb} (ref 5.5–14.2)

## 2024-07-04 LAB — HGB FRACTIONATION CASCADE
Hgb A2: 2.3 % (ref 1.8–3.2)
Hgb A: 97.7 % (ref 96.4–98.8)
Hgb F: 0 % (ref 0.0–2.0)
Hgb S: 0 %

## 2024-07-04 LAB — ZINC: Zinc: 70 ug/dL (ref 44–115)

## 2024-07-04 LAB — VITAMIN B12: Vitamin B-12: 458 pg/mL (ref 232–1245)

## 2024-07-04 LAB — VITAMIN B1: Thiamine: 56.6 nmol/L — AB (ref 66.5–200.0)

## 2024-07-04 MED ORDER — VITAMIN B-1 100 MG PO TABS
100.0000 mg | ORAL_TABLET | Freq: Every day | ORAL | 2 refills | Status: AC
  Filled 2024-07-04: qty 100, 100d supply, fill #0

## 2024-07-09 NOTE — Addendum Note (Signed)
 Addended by: ANTONIETTE VERMELL CROME on: 07/09/2024 04:44 PM   Modules accepted: Orders

## 2024-07-11 ENCOUNTER — Encounter (INDEPENDENT_AMBULATORY_CARE_PROVIDER_SITE_OTHER): Payer: Self-pay

## 2024-07-11 ENCOUNTER — Encounter: Payer: Self-pay | Admitting: Physician Assistant

## 2024-07-15 ENCOUNTER — Encounter: Admitting: Licensed Practical Nurse

## 2024-07-16 ENCOUNTER — Other Ambulatory Visit (HOSPITAL_BASED_OUTPATIENT_CLINIC_OR_DEPARTMENT_OTHER): Payer: Self-pay

## 2024-07-23 ENCOUNTER — Ambulatory Visit: Admitting: Physician Assistant

## 2024-08-02 ENCOUNTER — Ambulatory Visit (INDEPENDENT_AMBULATORY_CARE_PROVIDER_SITE_OTHER): Admitting: Physician Assistant

## 2024-08-02 VITALS — BP 130/100 | HR 80 | Ht 64.0 in

## 2024-08-02 DIAGNOSIS — M1711 Unilateral primary osteoarthritis, right knee: Secondary | ICD-10-CM

## 2024-08-02 DIAGNOSIS — M65311 Trigger thumb, right thumb: Secondary | ICD-10-CM | POA: Diagnosis not present

## 2024-08-02 DIAGNOSIS — F5101 Primary insomnia: Secondary | ICD-10-CM

## 2024-08-02 DIAGNOSIS — G629 Polyneuropathy, unspecified: Secondary | ICD-10-CM | POA: Diagnosis not present

## 2024-08-02 DIAGNOSIS — M069 Rheumatoid arthritis, unspecified: Secondary | ICD-10-CM | POA: Diagnosis not present

## 2024-08-02 DIAGNOSIS — E559 Vitamin D deficiency, unspecified: Secondary | ICD-10-CM | POA: Diagnosis not present

## 2024-08-02 DIAGNOSIS — M65312 Trigger thumb, left thumb: Secondary | ICD-10-CM

## 2024-08-02 DIAGNOSIS — E5111 Dry beriberi: Secondary | ICD-10-CM

## 2024-08-02 DIAGNOSIS — M329 Systemic lupus erythematosus, unspecified: Secondary | ICD-10-CM

## 2024-08-02 DIAGNOSIS — D509 Iron deficiency anemia, unspecified: Secondary | ICD-10-CM

## 2024-08-02 DIAGNOSIS — R7303 Prediabetes: Secondary | ICD-10-CM

## 2024-08-02 DIAGNOSIS — I10 Essential (primary) hypertension: Secondary | ICD-10-CM

## 2024-08-02 DIAGNOSIS — F332 Major depressive disorder, recurrent severe without psychotic features: Secondary | ICD-10-CM

## 2024-08-02 DIAGNOSIS — E782 Mixed hyperlipidemia: Secondary | ICD-10-CM

## 2024-08-02 DIAGNOSIS — Z9884 Bariatric surgery status: Secondary | ICD-10-CM

## 2024-08-02 MED ORDER — HYDROXYCHLOROQUINE SULFATE 200 MG PO TABS: 200.0000 mg | ORAL_TABLET | Freq: Two times a day (BID) | ORAL | 1 refills | Status: AC

## 2024-08-02 MED ORDER — LISINOPRIL-HYDROCHLOROTHIAZIDE 20-12.5 MG PO TABS: 1.0000 | ORAL_TABLET | Freq: Every day | ORAL | 0 refills | Status: AC

## 2024-08-02 MED ORDER — BUPROPION HCL ER (SR) 150 MG PO TB12: 150.0000 mg | ORAL_TABLET | Freq: Two times a day (BID) | ORAL | 1 refills | Status: AC

## 2024-08-02 MED ORDER — TRAZODONE HCL 50 MG PO TABS
50.0000 mg | ORAL_TABLET | Freq: Every day | ORAL | 1 refills | Status: AC
Start: 2024-08-02 — End: ?

## 2024-08-02 MED ORDER — DULOXETINE HCL 60 MG PO CPEP: 60.0000 mg | ORAL_CAPSULE | Freq: Every day | ORAL | 0 refills | Status: AC

## 2024-08-02 MED ORDER — TRAMADOL HCL 50 MG PO TABS: 50.0000 mg | ORAL_TABLET | Freq: Two times a day (BID) | ORAL | 0 refills | Status: AC | PRN

## 2024-08-02 NOTE — Progress Notes (Signed)
 Established Patient Office Visit  Subjective   Patient ID: Michelle Huffman, female    DOB: 1966/06/14  Age: 58 y.o. MRN: [id number]  Chief Complaint  Patient presents with   Medical Management of Chronic Issues    HPI .SABRADiscussed the use of AI scribe software for clinical note transcription with the patient, who gave verbal consent to proceed.  History of Present Illness Michelle Huffman is a 58 year old female with lupus who presents with chest and back pain.  Chest and back pain - Severe chest and back pain with occasional radiation - Pain worsens with movement, such as getting up from bed or turning - Pain relieved by remaining still and taking deep breaths - No pain with palpation or exertion - Spicy foods may trigger symptoms, though the relationship is unclear  Musculoskeletal symptoms - Significant joint pain, especially in the knees, rated 9/10 in severity - Trigger finger affecting the thumb, causing it to get stuck and be painful - No x-rays performed for joint or thumb symptoms  Systemic lupus erythematosus - Diagnosis of lupus - Managed with hydroxychloroquine  (Plaquenil ) - No rheumatology follow-up in approximately one year due to previous physician retirement  Blood pressure monitoring and management - Occasional home blood pressure monitoring, especially during periods of stress - On antihypertensive medication  Hematologic abnormalities - History of low thiamine levels; thiamine supplementation pending at pharmacy - Low hemoglobin levels; taking Fusion Plus for iron  supplementation since last blood work - No hematology evaluation to date  Pain and mood management - Taking Cymbalta  (duloxetine ) for mood and pain management, with beneficial effect - Uses tramadol  for pain relief - Prescribed meloxicam  for inflammation; uncertain about daily use - Avoids ibuprofen  due to concern for kidney effects  Vitamin d  deficiency - History of vitamin D   deficiency - On high-dose vitamin D  supplementation     ROS See HPI.    Objective:     BP (!) 130/100   Pulse 80   Ht 5' 4 (1.626 m)   SpO2 99%   BMI 31.07 kg/m  BP Readings from Last 3 Encounters:  08/02/24 (!) 130/100  06/27/24 134/84  03/08/24 130/80   Wt Readings from Last 3 Encounters:  06/27/24 181 lb (82.1 kg)  03/08/24 156 lb (70.8 kg)  06/14/23 169 lb (76.7 kg)      Physical Exam Constitutional:      Appearance: Normal appearance.  Cardiovascular:     Rate and Rhythm: Normal rate and regular rhythm.  Pulmonary:     Effort: Pulmonary effort is normal.     Breath sounds: Normal breath sounds.  Chest:     Chest wall: No tenderness.  Neurological:     General: No focal deficit present.     Mental Status: She is alert and oriented to person, place, and time.      The 10-year ASCVD risk score (Arnett DK, et al., 2019) is: 8.4%    Assessment & Plan:  SABRASABRADenita was seen today for medical management of chronic issues.  Diagnoses and all orders for this visit:  Microcytic anemia -     Fe+TIBC+Fer  Thiamine deficiency neuropathy -     Vitamin B1  Neuropathy -     DULoxetine  (CYMBALTA ) 60 MG capsule; Take 1 capsule (60 mg total) by mouth daily. -     BMP8+eGFR -     Sed Rate (ESR) -     C-reactive protein  Primary osteoarthritis of right knee -  traMADol  (ULTRAM ) 50 MG tablet; Take 1 tablet (50 mg total) by mouth every 12 (twelve) hours as needed for moderate pain (pain score 4-6). -     BMP8+eGFR -     Sed Rate (ESR) -     C-reactive protein  Primary insomnia -     traZODone  (DESYREL ) 50 MG tablet; Take 1 tablet (50 mg total) by mouth at bedtime. TAKE 0.5-1 TABLETS BY MOUTH AT BEDTIME AS NEEDED FOR SLEEP.  Primary hypertension -     BMP8+eGFR -     lisinopril -hydrochlorothiazide  (ZESTORETIC ) 20-12.5 MG tablet; Take 1 tablet by mouth daily.  Systemic lupus erythematosus, unspecified SLE type, unspecified organ involvement status (HCC) -      Vitamin B1 -     DULoxetine  (CYMBALTA ) 60 MG capsule; Take 1 capsule (60 mg total) by mouth daily. -     hydroxychloroquine  (PLAQUENIL ) 200 MG tablet; Take 1 tablet (200 mg total) by mouth 2 (two) times daily. -     VITAMIN D  25 Hydroxy (Vit-D Deficiency, Fractures) -     Fe+TIBC+Fer -     BMP8+eGFR -     Sed Rate (ESR) -     C-reactive protein -     Rheumatoid factor -     CYCLIC CITRUL PEPTIDE ANTIBODY, IGG/IGA  Trigger finger of right thumb -     BMP8+eGFR -     Sed Rate (ESR) -     C-reactive protein -     Rheumatoid factor -     CYCLIC CITRUL PEPTIDE ANTIBODY, IGG/IGA  Vitamin D  deficiency -     VITAMIN D  25 Hydroxy (Vit-D Deficiency, Fractures)  Pre-diabetes  S/P laparoscopic sleeve gastrectomy  Mixed hyperlipidemia  Severe episode of recurrent major depressive disorder, without psychotic features (HCC) -     buPROPion  (WELLBUTRIN  SR) 150 MG 12 hr tablet; Take 1 tablet (150 mg total) by mouth 2 (two) times daily. -     DULoxetine  (CYMBALTA ) 60 MG capsule; Take 1 capsule (60 mg total) by mouth daily.   Assessment & Plan Systemic lupus erythematosus with musculoskeletal pain and possible progression to inflammatory arthritis Chronic musculoskeletal pain with possible progression to inflammatory arthritis. Differential includes lupus flare or progression to rheumatoid arthritis. Pain exacerbated by movement, suggesting a muscular component. - Referred to rheumatology for evaluation. - Ordered labs for rheumatoid factor and other markers. - Administered Toradol  injection for pain relief. - Increased Cymbalta  to 60 mg. - Referred to orthopedic sports medicine for thumb injection.  Osteoarthritis of knee and thumb with trigger finger Chronic pain in knees and thumb, with trigger finger in thumb. Possible osteoarthritis given pain nature and age-related changes. - continue to wear thumb spica - Referred to orthopedic sports medicine for thumb injection. - Consider  glucosamine and chondroitin supplements.  Iron  deficiency anemia Chronic iron  deficiency anemia with hemoglobin at 9. Previous iron  infusions were beneficial. - Ordered iron  saturation panel. - Continue Fusion Plus iron  supplement. - Consider hematology referral for iron  infusion if guidelines met.  Vitamin D  deficiency Previously low vitamin D  levels. Currently on high-dose supplementation. - Ordered vitamin D  level to assess response. - Continue high-dose vitamin D  supplementation.  Thiamine (vitamin B1) deficiency No alcohol use reported. - Start over-the-counter thiamine until prescription available.  Hypertension Elevated blood pressure readings. Possible contribution from pain and stress. - Rechecked blood pressure during visit.  Chronic pain syndrome Significant pain in multiple areas, including chest, back, knees, and thumb. Pain management is challenging due to multiple contributing  factors. - Increased Cymbalta  to 60 mg. - Increased tramadol  to twice daily. - Administered Toradol  injection for pain relief.  Depression Managed with Cymbalta , which also aids in pain management. - Increased Cymbalta  to 60 mg.  Pt declined flu shot.      Link Burgeson, PA-C

## 2024-08-02 NOTE — Patient Instructions (Addendum)
 Get thiamine OTC 100mg  daily.  Continue Fusion plus iron  supplements.  Increase cymbalta  to 60mg  daily.  Increased lisinopril /HCT to 20/12.5mg  Tramadol  up to twice a day for pain Get labs for recheck today.   Referral to rheumatology/sports medicine(ortho), and maybe hematology.

## 2024-08-03 ENCOUNTER — Encounter: Payer: Self-pay | Admitting: Physician Assistant

## 2024-08-05 ENCOUNTER — Encounter: Payer: Self-pay | Admitting: Physician Assistant

## 2024-08-05 ENCOUNTER — Ambulatory Visit: Payer: Self-pay | Admitting: Physician Assistant

## 2024-08-05 MED ORDER — VITAMIN D (ERGOCALCIFEROL) 1.25 MG (50000 UNIT) PO CAPS: 50000.0000 [IU] | ORAL_CAPSULE | ORAL | 1 refills | Status: AC

## 2024-08-05 NOTE — Progress Notes (Signed)
 Marcos,   Inflammatory marker is elevated.   Vitamin D  not to goal. You are taking the high dose correct? Sent refills.   Kira, why was thiamine lab canceled?

## 2024-08-05 NOTE — Progress Notes (Signed)
 Yunique,   Vitamin d  improved but still not to goal. Continue on high dose make sure to take with dairy for better absorption.   ESR shows inflammation.

## 2024-08-06 LAB — IRON,TIBC AND FERRITIN PANEL
Ferritin: 42 ng/mL (ref 15–150)
Iron Saturation: 19 % (ref 15–55)
Iron: 63 ug/dL (ref 27–159)
Total Iron Binding Capacity: 324 ug/dL (ref 250–450)
UIBC: 261 ug/dL (ref 131–425)

## 2024-08-06 LAB — BMP8+EGFR
BUN/Creatinine Ratio: 20 (ref 9–23)
BUN: 16 mg/dL (ref 6–24)
CO2: 20 mmol/L (ref 20–29)
Calcium: 9.1 mg/dL (ref 8.7–10.2)
Chloride: 108 mmol/L — ABNORMAL HIGH (ref 96–106)
Creatinine, Ser: 0.82 mg/dL (ref 0.57–1.00)
Glucose: 80 mg/dL (ref 70–99)
Potassium: 4.1 mmol/L (ref 3.5–5.2)
Sodium: 143 mmol/L (ref 134–144)
eGFR: 83 mL/min/1.73 (ref >59)

## 2024-08-06 LAB — C-REACTIVE PROTEIN: CRP: 2 mg/L (ref 0–10)

## 2024-08-06 LAB — VITAMIN B1

## 2024-08-06 LAB — RHEUMATOID FACTOR: Rheumatoid fact SerPl-aCnc: 13.1 [IU]/mL (ref <14.0)

## 2024-08-06 LAB — VITAMIN D 25 HYDROXY (VIT D DEFICIENCY, FRACTURES): Vit D, 25-Hydroxy: 27.2 ng/mL — ABNORMAL LOW (ref 30.0–100.0)

## 2024-08-06 LAB — SEDIMENTATION RATE: Sed Rate: 71 mm/h — ABNORMAL HIGH (ref 0–40)

## 2024-08-06 LAB — CYCLIC CITRUL PEPTIDE ANTIBODY, IGG/IGA: Cyclic Citrullin Peptide Ab: 34 U — AB (ref 0–19)

## 2024-08-06 NOTE — Progress Notes (Signed)
 Evaleen,   Serum iron  down some but not in infusion range.   Inflammatory markers elevated and need to get in with rheumatology.   Vitamin D  still low but improved. Need to add to weekly are you taking the 50,000 with dairy?

## 2024-08-08 ENCOUNTER — Encounter (INDEPENDENT_AMBULATORY_CARE_PROVIDER_SITE_OTHER): Admitting: Adult Health

## 2024-08-08 ENCOUNTER — Encounter: Payer: Self-pay | Admitting: Physician Assistant

## 2024-08-15 ENCOUNTER — Encounter: Payer: Self-pay | Admitting: Sports Medicine

## 2024-08-15 ENCOUNTER — Ambulatory Visit (INDEPENDENT_AMBULATORY_CARE_PROVIDER_SITE_OTHER): Admitting: Sports Medicine

## 2024-08-15 DIAGNOSIS — M65311 Trigger thumb, right thumb: Secondary | ICD-10-CM

## 2024-08-15 DIAGNOSIS — M79644 Pain in right finger(s): Secondary | ICD-10-CM

## 2024-08-15 MED ORDER — BETAMETHASONE SOD PHOS & ACET 6 (3-3) MG/ML IJ SUSP
6.0000 mg | INTRAMUSCULAR | Status: AC | PRN
  Administered 2024-08-15: 6 mg via INTRA_ARTICULAR

## 2024-08-15 MED ORDER — LIDOCAINE HCL 1 % IJ SOLN
1.0000 mL | INTRAMUSCULAR | Status: AC | PRN
  Administered 2024-08-15: 1 mL

## 2024-08-15 NOTE — Progress Notes (Addendum)
 Michelle Huffman - 58 y.o. female MRN [id number]  Date of birth: 12/08/1965  Office Visit Note: Visit Date: 08/15/2024 PCP: Antoniette Vermell LITTIE, PA-C Referred by: Antoniette Vermell LITTIE, PA-C  Subjective: Chief Complaint  Patient presents with   Right Hand - Pain   HPI: Michelle Huffman is a pleasant 58 y.o. female who presents today for right thumb pain and triggering for the last 1-2 weeks.  She states over this time she woke up with her thumb in a flexed and locked position.  She had to extend/open the thumb with her contralateral hand which produced a click and associated pain.  Since this time she has had continued episodes of triggering that she has had open multiple times daily both actively and passively.  She has never had the symptoms before.  She did see her PCP to give her something for pain.  She is on tramadol  50 mg twice daily as needed.  She also has used Aleve in the past.  They recommended a trigger thumb brace which she does use which helps keep the thumb straight.  She is right-hand dominant and does do repetitive activity with her hand and her pain is limiting this.  Pertinent ROS were reviewed with the patient and found to be negative unless otherwise specified above in HPI.   Assessment & Plan: Visit Diagnoses:  1. Trigger finger of right thumb   2. Pain of right thumb    Plan: Impression is right thumb pain with triggering episodes for the last 1.5-2 weeks.  She does have nodular swelling over the A1 pulley of the right thumb.  We discussed the pathophysiology and overall treatment course for trigger thumb.  Through shared decision making, we did proceed with trigger thumb injection, patient tolerated well.  Band-Aid splint applied.  Use ice as well as Aleve as needed for any postinjection pain.  I would like her to keep the thumb in her splint continuously for the next 3 days, following this she may then wear at nighttime only or if painful.  She does have her tramadol  50  mg to take once to twice daily as needed for her breakthrough pain.  Discussed activity modification and avoidance of repetitive gripping.  I would like to see her back in 1 month for reevaluation.  If she has had a resolution of her triggering, we will leave the thumb alone.  If she has improvement but still having some triggering episodes we may consider 1 additional injection.  Did discuss the role for possible surgical release, but we will hold on this for now. F/u 1 month.  Follow-up: Return in about 1 month (around 09/14/2024) for R-trigger thumb f/u .   Meds & Orders: No orders of the defined types were placed in this encounter.   Orders Placed This Encounter  Procedures   Hand/UE Inj     Procedures: Hand/UE Inj: R long A1 for trigger finger on 08/15/2024 3:51 PM Indications: pain and tendon swelling Details: 25 G needle, volar approach Medications: 1 mL lidocaine  1 %; 6 mg betamethasone acetate-betamethasone sodium phosphate  6 (3-3) MG/ML Outcome: tolerated well, no immediate complications  Trigger finger injection, Right thumb After discussion on risks/benefits/indications and informed verbal consent was obtained, a timeout was performed. The area of tenderness at the A1 pulley was identified with palpable nodule located and marked. This area was cleaned with Betadine swab and alcohol swabs. After sterile precautions were taken, a 25-gauge, 5/8 needle was inserted into the A1  pulley with 1.0cc:1.0cc mixture of Lidocaine  1% and Betamethasone 6mg /mL. Patient tolerated procedure well and was observed for at least 5 minutes after injection with resolution of pain and improved ROM.  Procedure, treatment alternatives, risks and benefits explained, specific risks discussed. Consent was given by the patient. Immediately prior to procedure a time out was called to verify the correct patient, procedure, equipment, support staff and site/side marked as required. Patient was prepped and draped in  the usual sterile fashion.          Clinical History: No specialty comments available.  She reports that she quit smoking about 16 years ago. Her smoking use included cigarettes. She started smoking about 24 years ago. She has never used smokeless tobacco.  Recent Labs    03/21/24 1518  HGBA1C 5.3    Objective:    Physical Exam  Gen: Well-appearing, in no acute distress; non-toxic CV: Well-perfused. Warm.  Resp: Breathing unlabored on room air; no wheezing. Psych: Fluid speech in conversation; appropriate affect; normal thought process  Ortho Exam - Right hand/thumb: + TTP with nodular enlargement over the A1 pulley of the right thumb.  There is reproducible triggering noted both active and passively through range of motion.  There is a degree of soft tissue swelling over the volar aspect of the thumb in this location.  Cap refill less than 2 seconds.  Imaging:  *Independent review and interpretation of right hand x-ray from 03/08/2024 was performed by myself today.  X-rays demonstrate minimal OA with associated degenerative spurring at the STT-joint of the right hand and mild DJD at the MCP of the thumb and IP joint of index finger.  No acute fractures or otherwise acute bony abnormality noted.  Narrative & Impression  CLINICAL DATA:  Bilateral hand and finger pain/stiffness.   EXAM: RIGHT HAND - COMPLETE 3+ VIEW   COMPARISON:  None available   FINDINGS: Neutral ulnar variance. Minimal spurring at the dorsomedial aspect of the index finger DIP joint. Minimal degenerative spurring at the lateral aspect of the triscaphe joint. No acute fracture dislocation. No cortical erosion or periostitis.   IMPRESSION: Minimal degenerative spurring at the index finger DIP joint and triscaphe joint.     Electronically Signed   By: Tanda Lyons M.D.   On: 03/08/2024 17:43      Past Medical/Family/Surgical/Social History: Medications & Allergies reviewed per EMR, new medications  updated. Patient Active Problem List   Diagnosis Date Noted   Trigger finger of right thumb 08/02/2024   Low hemoglobin 03/26/2024   Vitamin D  deficiency 03/22/2024   Cold intolerance of hand 03/11/2024   Bilateral hand pain 03/08/2024   Positive TB test 06/14/2023   Overweight (BMI 25.0-29.9) 06/14/2023   Lumbar degenerative disc disease 01/10/2023   DDD (degenerative disc disease), thoracic 09/14/2022   Mid back pain 09/13/2022   Elevated LDL cholesterol level 08/17/2022   Pre-diabetes 08/17/2022   Class 2 obesity due to excess calories without serious comorbidity with body mass index (BMI) of 37.0 to 37.9 in adult 08/16/2022   S/P laparoscopic sleeve gastrectomy 08/16/2022   Primary hypertension 08/16/2022   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   Microcytic anemia 08/16/2022   Systemic lupus erythematosus (HCC) 07/20/2022   Plantar fascial fibromatosis of right foot 07/20/2022   Primary osteoarthritis of right knee 07/20/2022   Mixed hyperlipidemia 03/31/2021   Iron  deficiency 01/14/2016   Insomnia 11/17/2015   GAD (generalized anxiety disorder) 11/17/2015   Severe episode of recurrent major depressive disorder, without psychotic features (HCC)  11/17/2015   Alpha thalassemia trait 03/31/2015   Morbid obesity (HCC) 03/27/2014   Right shoulder pain 03/06/2014   Rheumatoid arthritis (HCC) 10/29/2013   ANA positive 10/17/2013   Asthma 10/17/2013   Elevated sed rate 10/17/2013   Recurrent bronchospasm 07/23/2013   Biliary dyskinesia 07/11/2011   PAROTIDITIS 10/11/2007   PITUITARY ADENOMA, BENIGN 07/25/2007   Hypothyroidism 05/03/2007   ANEMIA-IRON  DEFICIENCY 05/03/2007   Past Medical History:  Diagnosis Date   Alpha thalassemia major    Anemia    Arthritis    Asthma    Autoimmune disease    Blood transfusion    Chest pain    Complication of anesthesia    Hypertension    Hypothyroidism    Lupus    PONV (postoperative nausea and vomiting)    Family History  Problem  Relation Age of Onset   Depression Mother    Hypertension Father    Hyperlipidemia Father    Asthma Other    Hypertension Other    Hyperlipidemia Other    Alcohol abuse Neg Hx    Anxiety disorder Neg Hx    Bipolar disorder Neg Hx    Dementia Neg Hx    Drug abuse Neg Hx    Schizophrenia Neg Hx    Past Surgical History:  Procedure Laterality Date   CHOLECYSTECTOMY  07/21/2011   HYSTERECTOMY ABDOMINAL WITH SALPINGO-OOPHORECTOMY Right 09/02/2008   IR RADIOLOGIST EVAL & MGMT  06/28/2023   IR RADIOLOGIST EVAL & MGMT  09/22/2023   KNEE SURGERY  10/03/2008   laproscopic right knee   LAPAROSCOPIC GASTRIC SLEEVE RESECTION N/A 08/19/2014   Procedure: LAPAROSCOPIC GASTRIC SLEEVE RESECTION;  Surgeon: Morene Olives, MD;  Location: [facility];  Service: General;  Laterality: N/A;   UPPER GI ENDOSCOPY  08/19/2014   Procedure: UPPER GI ENDOSCOPY;  Surgeon: Morene Olives, MD;  Location: [facility];  Service: General;;   Social History   Occupational History  Tobacco Use   Smoking status: Former    Current packs/day: 0.00    Types: Cigarettes    Start date: 11/17/1999    Quit date: 11/17/2007    Years since quitting: 16.7   Smokeless tobacco: Never  Vaping Use   Vaping status: Never Used  Substance and Sexual Activity   Alcohol use: Yes    Comment: socially- a few times a year   Drug use: No   Sexual activity: Not on file

## 2024-08-15 NOTE — Progress Notes (Signed)
 Patient says that her right thumb has been sticking for the last week or so. She says that she initially woke up with it stuck, and had to move it with the left hand. She has had recurrent sticking in the time since then. Patient is right-hand dominant, and experiences these symptoms as she uses her hand throughout the day. She does not have pain, but she does have discomfort. She wears a brace, which helps as it immobilizes the thumb. She also feels she has improved motion when she holds her hand under hot water. She denies any history of trigger thumb in the past.

## 2024-08-16 ENCOUNTER — Encounter (INDEPENDENT_AMBULATORY_CARE_PROVIDER_SITE_OTHER): Payer: Self-pay | Admitting: Primary Care

## 2024-08-16 ENCOUNTER — Ambulatory Visit (INDEPENDENT_AMBULATORY_CARE_PROVIDER_SITE_OTHER): Admitting: Primary Care

## 2024-08-16 VITALS — BP 135/89 | HR 57 | Temp 98.2°F | Resp 16 | Ht 64.0 in

## 2024-08-16 DIAGNOSIS — M65311 Trigger thumb, right thumb: Secondary | ICD-10-CM | POA: Diagnosis not present

## 2024-08-16 DIAGNOSIS — E66811 Obesity, class 1: Secondary | ICD-10-CM | POA: Diagnosis not present

## 2024-08-16 DIAGNOSIS — Z6831 Body mass index (BMI) 31.0-31.9, adult: Secondary | ICD-10-CM

## 2024-08-16 DIAGNOSIS — I10 Essential (primary) hypertension: Secondary | ICD-10-CM

## 2024-08-16 DIAGNOSIS — Z1211 Encounter for screening for malignant neoplasm of colon: Secondary | ICD-10-CM

## 2024-08-16 DIAGNOSIS — Z7689 Persons encountering health services in other specified circumstances: Secondary | ICD-10-CM

## 2024-08-16 NOTE — Progress Notes (Signed)
 New Patient Office Visit  Subjective    Patient ID: Michelle Huffman female  DOB: [birth year]  Age: 58 y.o. MRN: [id number]   CC:  STRESSORS    HPI  Tanelle L Forester is a 58 year old obese female in today to establish care.Hx of Lupus manage outside of practice. Trigger thumb  -seen on 08/15/24  by Burnetta Brunet, DO right thumb pain and triggering for the last 1-2 weeks she woke up with her thumb in a flexed and locked position. She had to extend/open the thumb with her contralateral hand which produced a click and associated pain. INJ. PR BETAMETHASONE ACET&SOD PHOSPLIDOCANE HCL, 1MG . Ortho care will f/u for pain in knee managed by Curtis Debby PARAS, MD .Bruise on left leg. swelling started approximately a month ago unknown etiology denies fall or trauma-reviewing labs indicate iron  deficiency anemia with normal platelets Current Outpatient Medications on File Prior to Visit  Medication Sig Dispense Refill   albuterol  (VENTOLIN  HFA) 108 (90 Base) MCG/ACT inhaler Inhale 2 puffs into the lungs every 6 (six) hours as needed for shortness of breath. 6.7 g 1   buPROPion  (WELLBUTRIN  SR) 150 MG 12 hr tablet Take 1 tablet (150 mg total) by mouth 2 (two) times daily. 180 tablet 1   DULoxetine  (CYMBALTA ) 60 MG capsule Take 1 capsule (60 mg total) by mouth daily. 90 capsule 0   hydroxychloroquine  (PLAQUENIL ) 200 MG tablet Take 1 tablet (200 mg total) by mouth 2 (two) times daily. 180 tablet 1   Iron -FA-B Cmp-C-Biot-Probiotic (FUSION PLUS) CAPS Take 1 capsule by mouth daily. 90 capsule 3   lisinopril -hydrochlorothiazide  (ZESTORETIC ) 20-12.5 MG tablet Take 1 tablet by mouth daily. 90 tablet 0   meloxicam  (MOBIC ) 15 MG tablet Take 1 tablet (15 mg total) by mouth daily. 90 tablet 1   metoprolol  tartrate (LOPRESSOR ) 25 MG tablet Take 25 mg by mouth daily as needed.     ondansetron  (ZOFRAN ) 4 MG tablet Take 1-2 tablets (4-8 mg total) by mouth every 8 (eight) hours as needed for nausea or vomiting. 12  tablet 0   rosuvastatin  (CRESTOR ) 10 MG tablet Take 1 tablet (10 mg total) by mouth daily. 90 tablet 3   thiamine (VITAMIN B-1) 100 MG tablet Take 1 tablet (100 mg total) by mouth daily. 100 tablet 2   traMADol  (ULTRAM ) 50 MG tablet Take 1 tablet (50 mg total) by mouth every 12 (twelve) hours as needed for moderate pain (pain score 4-6). 60 tablet 0   traZODone  (DESYREL ) 50 MG tablet Take 1 tablet (50 mg total) by mouth at bedtime. TAKE 0.5-1 TABLETS BY MOUTH AT BEDTIME AS NEEDED FOR SLEEP. 90 tablet 1   Vitamin D , Ergocalciferol , (DRISDOL ) 1.25 MG (50000 UNIT) CAPS capsule Take 1 capsule (50,000 Units total) by mouth every 7 (seven) days. Take for 12 total doses(weeks) 12 capsule 1   lisinopril -hydrochlorothiazide  (ZESTORETIC ) 10-12.5 MG tablet Take 1 tablet by mouth daily. 90 tablet 1   No current facility-administered medications on file prior to visit.     Allergies  Allergen Reactions   Sulfa Antibiotics Shortness Of Breath and Swelling    Past Medical History:  Diagnosis Date   Alpha thalassemia major    Anemia    Arthritis    Asthma    Autoimmune disease    Blood transfusion    Chest pain    Complication of anesthesia    Hypertension    Hypothyroidism    Lupus    PONV (postoperative nausea and vomiting)  Past Surgical History:  Procedure Laterality Date   CHOLECYSTECTOMY  07/21/2011   HYSTERECTOMY ABDOMINAL WITH SALPINGO-OOPHORECTOMY Right 09/02/2008   IR RADIOLOGIST EVAL & MGMT  06/28/2023   IR RADIOLOGIST EVAL & MGMT  09/22/2023   KNEE SURGERY  10/03/2008   laproscopic right knee   LAPAROSCOPIC GASTRIC SLEEVE RESECTION N/A 08/19/2014   Procedure: LAPAROSCOPIC GASTRIC SLEEVE RESECTION;  Surgeon: Morene Olives, MD;  Location: [facility];  Service: General;  Laterality: N/A;   UPPER GI ENDOSCOPY  08/19/2014   Procedure: UPPER GI ENDOSCOPY;  Surgeon: Morene Olives, MD;  Location: [facility];  Service: General;;     Family History  Problem Relation Age of Onset    Depression Mother    Hypertension Father    Hyperlipidemia Father    Asthma Other    Hypertension Other    Hyperlipidemia Other    Alcohol abuse Neg Hx    Anxiety disorder Neg Hx    Bipolar disorder Neg Hx    Dementia Neg Hx    Drug abuse Neg Hx    Schizophrenia Neg Hx     Social History   Socioeconomic History   Marital status: Single    Spouse name: Not on file   Number of children: 1   Years of education: 13   Highest education level: Some college, no degree  Occupational History  Tobacco Use   Smoking status: Former    Current packs/day: 0.00    Types: Cigarettes    Start date: 11/17/1999    Quit date: 11/17/2007    Years since quitting: 16.7   Smokeless tobacco: Never  Vaping Use   Vaping status: Never Used  Substance and Sexual Activity   Alcohol use: Yes    Comment: socially- a few times a year   Drug use: No   Sexual activity: Not on file  Other Topics Concern   Not on file  Social History Narrative   Pt lives in [place] with her 17yo daughter. Born and raised in [place] by her parents. Pt has 2 bro and pt is the youngest. States it was a good childhood. Pt has completed one year of college. 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 Pt has never been married.    Social Drivers of Health   Financial Resource Strain: Medium Risk (08/02/2024)   Overall Financial Resource Strain (CARDIA)    Difficulty of Paying Living Expenses: Somewhat hard  Food Insecurity: Food Insecurity Present (08/02/2024)   Hunger Vital Sign    Worried About Running Out of Food in the Last Year: Sometimes true    Ran Out of Food in the Last Year: Sometimes true  Transportation Needs: No Transportation Needs (08/02/2024)   PRAPARE - Administrator, Civil Service (Medical): No    Lack of Transportation (Non-Medical): No  Physical Activity: Insufficiently Active (03/09/2023)   Exercise Vital Sign    Days of Exercise per Week: 2 days    Minutes of  Exercise per Session: 20 min  Stress: Stress Concern Present (03/09/2023)   [employer] of Occupational Health - Occupational Stress Questionnaire    Feeling of Stress : Rather much  Social Connections: Unknown (08/02/2024)   Social Connection and Isolation Panel    Frequency of Communication with Friends and Family: Once a week    Frequency of Social Gatherings with Friends and Family: Patient declined    Attends Religious Services: Patient declined    Active Member of  Clubs or Organizations: No    Attends Banker Meetings: Not on file    Marital Status: Never married  Intimate Partner Violence: Unknown (01/03/2022)   Received from [facility]   HITS    Physically Hurt: Not on file    Insult or Talk Down To: Not on file    Threaten Physical Harm: Not on file    Scream or Curse: Not on file       Health Maintenance  Topic Date Due   Colon Cancer Screening  Never done   Breast Cancer Screening  12/07/2023   COVID-19 Vaccine (4 - 2025-26 season) 06/03/2024   Flu Shot  12/31/2024*   Pap with HPV screening  08/02/2025*   Hepatitis B Vaccine (1 of 3 - 19+ 3-dose series) 08/02/2025*   Hepatitis C Screening  08/02/2025*   DTaP/Tdap/Td vaccine (5 - Td or Tdap) 03/08/2034   Pneumococcal Vaccine for age over 100  Completed   HIV Screening  Completed   Zoster (Shingles) Vaccine  Completed   HPV Vaccine  Aged Out   Meningitis B Vaccine  Aged Out  *Topic was postponed. The date shown is not the original due date.    Objective   BP 135/89   Pulse (!) 57   Temp 98.2 F (36.8 C) (Oral)   Resp 16   Ht 5' 4 (1.626 m)   SpO2 98%   BMI 31.07 kg/m    Physical Exam Vitals reviewed.  Constitutional:      Appearance: Normal appearance. She is obese.  HENT:     Head: Normocephalic.     Right Ear: Tympanic membrane, ear canal and external ear normal.     Left Ear: Tympanic membrane, ear canal and external ear normal.     Nose: Nose normal.     Mouth/Throat:      Mouth: Mucous membranes are moist.  Eyes:     Extraocular Movements: Extraocular movements intact.     Pupils: Pupils are equal, round, and reactive to light.  Cardiovascular:     Rate and Rhythm: Normal rate.  Pulmonary:     Effort: Pulmonary effort is normal.     Breath sounds: Normal breath sounds.  Abdominal:     General: Bowel sounds are normal.     Palpations: Abdomen is soft.  Musculoskeletal:        General: Normal range of motion.     Cervical back: Normal range of motion.  Skin:    General: Skin is warm and dry.  Neurological:     Mental Status: She is alert and oriented to person, place, and time.  Psychiatric:        Mood and Affect: Mood normal.        Behavior: Behavior normal.        Thought Content: Thought content normal.      Assessment & Plan:  Hannahgrace was seen today for establish care.  Diagnoses and all orders for this visit:  Encounter to establish care  Trigger finger of right thumb Source of pain managed by Ortho which also can be contributed to elevated BP  Class 1 obesity due to excess calories without serious comorbidity with body mass index (BMI) of 31.0 to 31.9 in adult Obesity is 30-39 indicating an excess in caloric intake or underlining conditions. This may lead to other co-morbidities. Educated on lifestyle modifications of diet and exercise which may reduce obesity.   Previously on Mounjaro  last written August 2025.  Will reevaluate  weight management the first of the year.  Explained holidays upcoming enjoy   Colon cancer screening Referral to GI   Primary hypertension  BP goal - < 130/80 Explained that having normal blood pressure is the goal and medications are helping to get to goal and maintain normal blood pressure. DIET: Limit salt intake, read nutrition labels to check salt content, limit fried and high fatty foods  Avoid using multisymptom OTC cold preparations that generally contain sudafed which can rise BP. Consult with  pharmacist on best cold relief products to use for persons with HTN EXERCISE Discussed incorporating exercise such as walking - 30 minutes most days of the week and can do in 10 minute intervals      Follow-up:  8 weeks  The above assessment and management plan was discussed with the patient. The patient verbalized understanding of and has agreed to the management plan. Patient is aware to call the clinic if symptoms fail to improve or worsen. Patient is aware when to return to the clinic for a follow-up visit. Patient educated on when it is appropriate to go to the emergency department.   Rosaline Bohr, NP-C

## 2024-08-16 NOTE — Progress Notes (Signed)
 Would like to see about getting Mounjaro  filled.  Bruise on left leg. Noticed swelling. This happened 1 month.  Would like to get scheduled with GI specialist for colonoscopy in [place] but do not see where it is in the chart.

## 2024-08-22 ENCOUNTER — Encounter (INDEPENDENT_AMBULATORY_CARE_PROVIDER_SITE_OTHER): Payer: Self-pay | Admitting: Primary Care

## 2024-08-26 ENCOUNTER — Encounter: Admitting: Licensed Practical Nurse

## 2024-08-26 NOTE — Progress Notes (Deleted)
 Gynecology Annual Exam  PCP: Antoniette Vermell CROME, PA-C  Chief Complaint: No chief complaint on file.   History of Present Illness:Patient is a 58 y.o. No obstetric history on file. presents for annual exam. The patient has no complaints today.   LMP: No LMP recorded. Patient has had a hysterectomy. Menarche:{numbers 0-81:85324} Average Interval: {Desc; regular/irreg:14544}, {numbers 22-35:14824} days Duration of flow: {numbers; 0-10:33138} days Heavy Menses: {yes/no:63} Clots: {yes/no:63} Intermenstrual Bleeding: {yes/no:63} Postcoital Bleeding: {yes/no:63} Dysmenorrhea: {yes/no:63}  The patient {sys sexually active:13135} sexually active. She {has/denies:315300} dyspareunia.  The patient {DOES_DOES WNU:81435} perform self breast exams.  There {is/is no:19420} notable family history of breast or ovarian cancer in her family.  The patient wears seatbelts: {yes/no:63}.   The patient has regular exercise: {yes/no/not asked:9010}.    The patient {Blank single:19197::reports,denies} current symptoms of depression.     Review of Systems: ROS  Past Medical History:  Patient Active Problem List   Diagnosis Date Noted   Trigger finger of right thumb 08/02/2024   Low hemoglobin 03/26/2024   Vitamin D  deficiency 03/22/2024   Cold intolerance of hand 03/11/2024   Bilateral hand pain 03/08/2024   Positive TB test 06/14/2023   Overweight (BMI 25.0-29.9) 06/14/2023   Lumbar degenerative disc disease 01/10/2023   DDD (degenerative disc disease), thoracic 09/14/2022   Mid back pain 09/13/2022   Elevated LDL cholesterol level 08/17/2022   Pre-diabetes 08/17/2022   Class 2 obesity due to excess calories without serious comorbidity with body mass index (BMI) of 37.0 to 37.9 in adult 08/16/2022   S/P laparoscopic sleeve gastrectomy 08/16/2022   Primary hypertension 08/16/2022   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   Microcytic anemia 08/16/2022   Systemic lupus erythematosus (HCC)  07/20/2022   Plantar fascial fibromatosis of right foot 07/20/2022   Primary osteoarthritis of right knee 07/20/2022   Mixed hyperlipidemia 03/31/2021   Iron  deficiency 01/14/2016   Insomnia 11/17/2015   GAD (generalized anxiety disorder) 11/17/2015   Severe episode of recurrent major depressive disorder, without psychotic features (HCC) 11/17/2015   Alpha thalassemia trait 03/31/2015   Morbid obesity (HCC) 03/27/2014   Right shoulder pain 03/06/2014         Rheumatoid arthritis (HCC) 10/29/2013    Formatting of this note might be different from the original. ICD-10 cut over    ANA positive 10/17/2013   Asthma 10/17/2013   Elevated sed rate 10/17/2013   Recurrent bronchospasm 07/23/2013    Formatting of this note might be different from the original. Suspect mild/moderate persistent asthma    Biliary dyskinesia 07/11/2011   PAROTIDITIS 10/11/2007    Qualifier: Diagnosis of  By: Kassie MD, Sean A     PITUITARY ADENOMA, BENIGN 07/25/2007    Qualifier: Diagnosis of  By: Kassie MD, Sean A     Hypothyroidism 05/03/2007    Qualifier: Diagnosis of  By: Wilhemina SHARALYN Aspen   IMO SNOMED Dx Update Oct 2024    ANEMIA-IRON  DEFICIENCY 05/03/2007    Qualifier: Diagnosis of  By: Wilhemina SHARALYN Aspen       Past Surgical History:  Past Surgical History:  Procedure Laterality Date   CHOLECYSTECTOMY  07/21/2011   HYSTERECTOMY ABDOMINAL WITH SALPINGO-OOPHORECTOMY Right 09/02/2008   IR RADIOLOGIST EVAL & MGMT  06/28/2023   IR RADIOLOGIST EVAL & MGMT  09/22/2023   KNEE SURGERY  10/03/2008   laproscopic right knee   LAPAROSCOPIC GASTRIC SLEEVE RESECTION N/A 08/19/2014   Procedure: LAPAROSCOPIC GASTRIC SLEEVE RESECTION;  Surgeon: Morene Olives, MD;  Location: [facility];  Service: General;  Laterality: N/A;   UPPER GI ENDOSCOPY  08/19/2014   Procedure: UPPER GI ENDOSCOPY;  Surgeon: Morene Olives, MD;  Location: [facility];  Service: General;;    Gynecologic History:  No LMP recorded.  Patient has had a hysterectomy. Last Pap: Results were: *** {Findings; lab pap smear results:16707::NIL and HR HPV+,NIL and HR HPV negative}  Last mammogram: *** Results were: {Blank single:19197::***,BI-RADS IV,BI-RAD III,BI-RAD II,BI-RAD I}  Obstetric History: No obstetric history on file.  Family History:  Family History  Problem Relation Age of Onset   Depression Mother    Hypertension Father    Hyperlipidemia Father    Asthma Other    Hypertension Other    Hyperlipidemia Other    Alcohol abuse Neg Hx    Anxiety disorder Neg Hx    Bipolar disorder Neg Hx    Dementia Neg Hx    Drug abuse Neg Hx    Schizophrenia Neg Hx     Social History:  Social History   Socioeconomic History   Marital status: Single    Spouse name: Not on file   Number of children: 1   Years of education: 13   Highest education level: Some college, no degree  Occupational History  Tobacco Use   Smoking status: Former    Current packs/day: 0.00    Types: Cigarettes    Start date: 11/17/1999    Quit date: 11/17/2007    Years since quitting: 16.7   Smokeless tobacco: Never  Vaping Use   Vaping status: Never Used  Substance and Sexual Activity   Alcohol use: Yes    Comment: socially- a few times a year   Drug use: No   Sexual activity: Not on file  Other Topics Concern   Not on file  Social History Narrative   Pt lives in [place] with her 17yo daughter. Born and raised in [place] by her parents. Pt has 2 bro and pt is the youngest. States it was a good childhood. Pt has completed one year of college. 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 Pt has never been married.    Social Drivers of Health   Financial Resource Strain: Medium Risk (08/02/2024)   Overall Financial Resource Strain (CARDIA)    Difficulty of Paying Living Expenses: Somewhat hard  Food Insecurity: Food Insecurity Present (08/02/2024)   Hunger Vital Sign    Worried About Running Out of  Food in the Last Year: Sometimes true    Ran Out of Food in the Last Year: Sometimes true  Transportation Needs: No Transportation Needs (08/02/2024)   PRAPARE - Administrator, Civil Service (Medical): No    Lack of Transportation (Non-Medical): No  Physical Activity: Insufficiently Active (03/09/2023)   Exercise Vital Sign    Days of Exercise per Week: 2 days    Minutes of Exercise per Session: 20 min  Stress: Stress Concern Present (03/09/2023)   [employer] of Occupational Health - Occupational Stress Questionnaire    Feeling of Stress : Rather much  Social Connections: Unknown (08/02/2024)   Social Connection and Isolation Panel    Frequency of Communication with Friends and Family: Once a week    Frequency of Social Gatherings with Friends and Family: Patient declined    Attends Religious Services: Patient declined    Database Administrator or Organizations: No    Attends Engineer, Structural: Not on file    Marital Status:  Never married  Intimate Partner Violence: Unknown (01/03/2022)   Received from [facility]   HITS    Physically Hurt: Not on file    Insult or Talk Down To: Not on file    Threaten Physical Harm: Not on file    Scream or Curse: Not on file    Allergies:  Allergies  Allergen Reactions   Sulfa Antibiotics Shortness Of Breath and Swelling    Medications: Prior to Admission medications   Medication Sig Start Date End Date Taking? Authorizing Provider  albuterol  (VENTOLIN  HFA) 108 (90 Base) MCG/ACT inhaler Inhale 2 puffs into the lungs every 6 (six) hours as needed for shortness of breath. 04/17/23   Breeback, Jade L, PA-C  buPROPion  (WELLBUTRIN  SR) 150 MG 12 hr tablet Take 1 tablet (150 mg total) by mouth 2 (two) times daily. 08/02/24   Breeback, Jade L, PA-C  DULoxetine  (CYMBALTA ) 60 MG capsule Take 1 capsule (60 mg total) by mouth daily. 08/02/24   Breeback, Jade L, PA-C  hydroxychloroquine  (PLAQUENIL ) 200 MG tablet Take 1 tablet  (200 mg total) by mouth 2 (two) times daily. 08/02/24   Breeback, Jade L, PA-C  Iron -FA-B Cmp-C-Biot-Probiotic (FUSION PLUS) CAPS Take 1 capsule by mouth daily. 01/09/23   Breeback, Jade L, PA-C  lisinopril -hydrochlorothiazide  (ZESTORETIC ) 20-12.5 MG tablet Take 1 tablet by mouth daily. 08/02/24   Breeback, Jade L, PA-C  meloxicam  (MOBIC ) 15 MG tablet Take 1 tablet (15 mg total) by mouth daily. 03/08/24   Breeback, Jade L, PA-C  metoprolol  tartrate (LOPRESSOR ) 25 MG tablet Take 25 mg by mouth daily as needed.    [provider]  ondansetron  (ZOFRAN ) 4 MG tablet Take 1-2 tablets (4-8 mg total) by mouth every 8 (eight) hours as needed for nausea or vomiting. 11/18/18   Maranda Jamee Jacob, MD  rosuvastatin  (CRESTOR ) 10 MG tablet Take 1 tablet (10 mg total) by mouth daily. 03/22/24   Breeback, Jade L, PA-C  thiamine  (VITAMIN B-1) 100 MG tablet Take 1 tablet (100 mg total) by mouth daily. 07/04/24   Crain, Whitney L, PA  traMADol  (ULTRAM ) 50 MG tablet Take 1 tablet (50 mg total) by mouth every 12 (twelve) hours as needed for moderate pain (pain score 4-6). 08/02/24   Breeback, Jade L, PA-C  traZODone  (DESYREL ) 50 MG tablet Take 1 tablet (50 mg total) by mouth at bedtime. TAKE 0.5-1 TABLETS BY MOUTH AT BEDTIME AS NEEDED FOR SLEEP. 08/02/24   Breeback, Jade L, PA-C  Vitamin D , Ergocalciferol , (DRISDOL ) 1.25 MG (50000 UNIT) CAPS capsule Take 1 capsule (50,000 Units total) by mouth every 7 (seven) days. Take for 12 total doses(weeks) 08/05/24   Antoniette Vermell CROME, PA-C    Physical Exam Vitals: There were no vitals taken for this visit.  General: NAD HEENT: normocephalic, anicteric Thyroid : no enlargement, no palpable nodules Pulmonary: No increased work of breathing, CTAB Cardiovascular: RRR, distal pulses 2+ Breast: Breast symmetrical, no tenderness, no palpable nodules or masses, no skin or nipple retraction present, no nipple discharge.  No axillary or supraclavicular lymphadenopathy. Abdomen: NABS,  soft, non-tender, non-distended.  Umbilicus without lesions.  No hepatomegaly, splenomegaly or masses palpable. No evidence of hernia  Genitourinary:  External: Normal external female genitalia.  Normal urethral meatus, normal Bartholin's and Skene's glands.    Vagina: Normal vaginal mucosa, no evidence of prolapse.    Cervix: Grossly normal in appearance, no bleeding  Uterus: Non-enlarged, mobile, normal contour.  No CMT  Adnexa: ovaries non-enlarged, no adnexal masses  Rectal: deferred  Lymphatic: no evidence of inguinal lymphadenopathy Extremities: no edema, erythema, or tenderness Neurologic: Grossly intact Psychiatric: mood appropriate, affect full  Female chaperone present for pelvic and breast  portions of the physical exam     Assessment: 58 y.o. No obstetric history on file. routine annual exam  Plan: Problem List Items Addressed This Visit   None   1) Mammogram - recommend yearly screening mammogram.  Mammogram {Blank single:19197::Is up to date,Was ordered today}  2) STI screening  {Blank single:19197::was,was not}offered and {Blank single:19197::accepted,declined,therefore not obtained}  3) ASCCP guidelines and rational discussed.  Patient opts for {Blank single:19197::***,every 5 years,every 3 years,yearly,discontinue age >65,discontinue secondary to prior hysterectomy} screening interval  4) Osteoporosis  - per USPTF routine screening DEXA at age 66 - FRAX 10 year major fracture risk ***,  10 year hip fracture risk ***  Consider FDA-approved medical therapies in postmenopausal women and men aged 40 years and older, based on the following: a) A hip or vertebral (clinical or morphometric) fracture b) T-score <= -2.5 at the femoral neck or spine after appropriate evaluation to exclude secondary causes C) Low bone mass (T-score between -1.0 and -2.5 at the femoral neck or spine) and a 10-year probability of a hip fracture >= 3% or a 10-year  probability of a major osteoporosis-related fracture >= 20% based on the [place] -adapted WHO algorithm   5) Routine healthcare maintenance including cholesterol, diabetes screening discussed {Blank single:19197::managed by PCP,Ordered today,To return fasting at a later date,Declines}  6) Colonoscopy ***.  Screening recommended starting at age 58 for average risk individuals, age 20 for individuals deemed at increased risk (including African Americans) and recommended to continue until age 69.  For patient age 11-85 individualized approach is recommended.  Gold standard screening is via colonoscopy, Cologuard screening is an acceptable alternative for patient unwilling or unable to undergo colonoscopy.  Colorectal cancer screening for average?risk adults: 2018 guideline update from the American Cancer SocietyCA: A Cancer Journal for Clinicians: Mar 01, 2017   7) No follow-ups on file.   Jinnie Cookey, CNM  [facility] 08/26/2024, 8:32 AM

## 2024-09-02 ENCOUNTER — Encounter: Payer: Self-pay | Admitting: Physician Assistant

## 2024-09-05 ENCOUNTER — Ambulatory Visit: Payer: Self-pay

## 2024-09-05 ENCOUNTER — Encounter (INDEPENDENT_AMBULATORY_CARE_PROVIDER_SITE_OTHER): Payer: Self-pay | Admitting: Primary Care

## 2024-09-05 DIAGNOSIS — M329 Systemic lupus erythematosus, unspecified: Secondary | ICD-10-CM

## 2024-09-05 NOTE — Telephone Encounter (Addendum)
 Addendum to correct protocol documentation, patient is having Lupus flair, not crones as documented in error.     FYI Only or Action Required?: Action required by provider: request for appointment and New med request.  Patient was last seen in primary care on 08/16/2024 by Michelle Rosaline SQUIBB, NP.  Called Nurse Triage reporting Chest Pain.  Symptoms began several days ago.  Interventions attempted: Rest, hydration, or home remedies.  Symptoms are: gradually worsening.  Triage Disposition: See PCP When Office is Open (Within 3 Days)  Patient/caregiver understands and will follow disposition?: No, wishes to speak with PCP  09/05/24 Addendum: Patient with Lupus flair not crones.   Copied from CRM #[id number]. Topic: Appointments - Appointment Scheduling >> Sep 05, 2024 12:01 PM Emylou G wrote: Chest pain and back pain Reason for Disposition  [1] Chest pain(s) lasting a few seconds AND [2] persists > 3 days  Answer Assessment - Initial Assessment Questions Called for prednisone  refill. Transfer from [place] to [place] due to moving and location to home.  No appointments are available, advised urgent care evaluation which she refuses and states this a Crones flair, I am not going to urgent care for something I already know what it it.  Requesting PCP [name] to fill prednisone  for Crone's Flair, last prednisone  in 2024   1. LOCATION: Where does it hurt?       Chest pain  2. RADIATION: Does the pain go anywhere else? (e.g., into neck, jaw, arms, back)     Starts in back feels like it vibrated to chest-usually treated with prednisone  3. ONSET: When did the chest pain begin? (Minutes, hours or days)      Few days 4. PATTERN: Does the pain come and go, or has it been constant since it started?  Does it get worse with exertion?      Intermittent  5. DURATION: How long does it last (e.g., seconds, minutes, hours)     Few seconds with movement  6. SEVERITY: How bad  is the pain?  (e.g., Scale 1-10; mild, moderate, or severe)     Moderate  7. CARDIAC RISK FACTORS: Do you have any history of heart problems or risk factors for heart disease? (e.g., angina, prior heart attack; diabetes, high blood pressure, high cholesterol, smoker, or strong family history of heart disease)      8. PULMONARY RISK FACTORS: Do you have any history of lung disease?  (e.g., blood clots in lung, asthma, emphysema, birth control pills)      9. CAUSE: What do you think is causing the chest pain?     Back pain 10. OTHER SYMPTOMS: Do you have any other symptoms? (e.g., dizziness, nausea, vomiting, sweating, fever, difficulty breathing, cough)       Denies  11. PREGNANCY: Is there any chance you are pregnant? When was your last menstrual period?  Protocols used: Chest Pain-A-AH

## 2024-09-06 ENCOUNTER — Ambulatory Visit: Admitting: Physician Assistant

## 2024-09-06 ENCOUNTER — Telehealth (INDEPENDENT_AMBULATORY_CARE_PROVIDER_SITE_OTHER): Payer: Self-pay | Admitting: Primary Care

## 2024-09-06 NOTE — Telephone Encounter (Signed)
 Will forward to provider

## 2024-09-06 NOTE — Telephone Encounter (Unsigned)
 Copied from CRM [id number]. Topic: Clinical - Medication Question >> Sep 05, 2024  4:22 PM Anairis L wrote: Reason for CRM: patient is requesting  prednisone  to be sent due to lupus flare up.  Please advise.  [id number] [place] [address] [place] [address] Phone: [phone] Fax: [phone] Hours: Not open 24 hours

## 2024-09-08 ENCOUNTER — Encounter: Payer: Self-pay | Admitting: Sports Medicine

## 2024-09-09 ENCOUNTER — Ambulatory Visit: Payer: Self-pay

## 2024-09-09 MED ORDER — PREDNISONE 10 MG (21) PO TBPK: ORAL_TABLET | ORAL | 0 refills | Status: DC

## 2024-09-09 MED ORDER — PREDNISONE 10 MG (21) PO TBPK: ORAL_TABLET | ORAL | 0 refills | Status: AC

## 2024-09-09 NOTE — Telephone Encounter (Signed)
 Will forward to provider

## 2024-09-09 NOTE — Telephone Encounter (Signed)
 FYI Only or Action Required?: Action required by provider: medication refill request, clinical question for provider, and update on patient condition.  Patient was last seen in primary care on 08/16/2024 by Celestia Rosaline SQUIBB, NP.  Called Nurse Triage reporting Pain.  Symptoms began ongoing.  Interventions attempted: OTC medications: tylenol , ibuprofen .  Symptoms are: unchanged.  Triage Disposition: See HCP Within 4 Hours (Or PCP Triage)  Patient/caregiver understands and will follow disposition?: No, wishes to speak with PCP        Copied from CRM #[id number]. Topic: Clinical - Red Word Triage >> Sep 09, 2024  9:02 AM Rosaria BRAVO wrote: Red Word that prompted transfer to Nurse Triage: Pt called reporting chest pain, seeking rheumatology referral. Requested steroids from her PCP Rosaline Celestia.SABRA PCP declined and said she needs to contact the last prescriber. She has timed out with that provider and would be a new patient. Meanwhile she has significant chest pain and cannot walk around like this. Reason for Disposition  [1] SEVERE back pain (e.g., excruciating, unable to do any normal activities) AND [2] not improved 2 hours after pain medicine  Answer Assessment - Initial Assessment Questions Pt called upset. She states she is trying to get some medications that she needs. She has lupus but her old doctor retired and she hasn't been there for a while so she is considered a new patient so she did schedule an appt but she can't be seen until March. She states, Dr Celestia has told her that she doesn't treat lupus but she is needing some help in the meantime.  Pt did state that she is having chest pain and the pain feels like what she would imagine if she was having a heart attack and was told previously to go to the ER. She states she is not going to go to the ER because she knows what is causing it and needs her doctor to help. She states it is back pain that vibrates to her chest.  Denies any shortness of breath. States it feels the exact same way every time she has had a flare like this. She states when it is like this, she needs a steroid. She states she doesn't like taking them but when it gets unbearable that's what she needs. She states tylenol  and ibuprofen  have not been helping.    Pt would not answer many questions. Did say pain is in back and vibrates to chest and denies shortness of breath. She did apologize for being upset, is just having a rough morning and needs help. Advised would send message to provider  Protocols used: Back Pain-A-AH

## 2024-09-10 NOTE — Telephone Encounter (Signed)
 Will forward to provider

## 2024-09-12 ENCOUNTER — Other Ambulatory Visit (INDEPENDENT_AMBULATORY_CARE_PROVIDER_SITE_OTHER): Payer: Self-pay | Admitting: Primary Care

## 2024-09-12 DIAGNOSIS — M51361 Other intervertebral disc degeneration, lumbar region with lower extremity pain only: Secondary | ICD-10-CM

## 2024-09-12 DIAGNOSIS — M0579 Rheumatoid arthritis with rheumatoid factor of multiple sites without organ or systems involvement: Secondary | ICD-10-CM

## 2024-09-13 ENCOUNTER — Ambulatory Visit: Admitting: Sports Medicine

## 2024-09-18 NOTE — Telephone Encounter (Signed)
 Will forward to provider

## 2024-10-01 ENCOUNTER — Telehealth (INDEPENDENT_AMBULATORY_CARE_PROVIDER_SITE_OTHER): Payer: Self-pay

## 2024-10-01 NOTE — Telephone Encounter (Signed)
 Copied from CRM #[id number]. Topic: Referral - Request for Referral >> Oct 01, 2024  1:46 PM Tobias L wrote: Did the patient discuss referral with their provider in the last year? Yes  Appointment offered? No  Type of order/referral and detailed reason for visit: Rheumatology   Preference of office, provider, location: [facility] Leeroy [facility]  [address], [address], [place], [place]  [address] [phone]  If referral order, have you been seen by this specialty before? Yes, has been seen at [facility]  Can we respond through MyChart? Yes

## 2024-10-01 NOTE — Telephone Encounter (Signed)
 Altamese would you be able to help with this

## 2024-10-25 ENCOUNTER — Ambulatory Visit (INDEPENDENT_AMBULATORY_CARE_PROVIDER_SITE_OTHER): Payer: Self-pay | Admitting: Primary Care

## 2024-10-29 ENCOUNTER — Other Ambulatory Visit: Payer: Self-pay | Admitting: Physician Assistant

## 2024-10-29 DIAGNOSIS — I10 Essential (primary) hypertension: Secondary | ICD-10-CM

## 2024-10-30 ENCOUNTER — Other Ambulatory Visit: Payer: Self-pay | Admitting: Physician Assistant

## 2024-10-30 DIAGNOSIS — I10 Essential (primary) hypertension: Secondary | ICD-10-CM

## 2024-10-30 NOTE — Telephone Encounter (Signed)
 Forwarding request to listed PCP for review

## 2024-11-08 ENCOUNTER — Ambulatory Visit: Admitting: Physician Assistant

## 2024-11-12 ENCOUNTER — Ambulatory Visit: Admitting: Primary Care

## 2024-12-04 ENCOUNTER — Ambulatory Visit: Admitting: Rheumatology

## 2025-01-09 ENCOUNTER — Ambulatory Visit: Admitting: Rheumatology
